# Patient Record
Sex: Female | Born: 1940 | Race: Black or African American | Hispanic: No | Marital: Married | Smoking: Never smoker
Health system: Southern US, Community
[De-identification: ages and names within clinical notes are randomized; demographics above are authoritative.]

## PROBLEM LIST (undated history)

## (undated) DIAGNOSIS — I10 Essential (primary) hypertension: Secondary | ICD-10-CM

## (undated) DIAGNOSIS — N611 Abscess of the breast and nipple: Secondary | ICD-10-CM

## (undated) DIAGNOSIS — D649 Anemia, unspecified: Secondary | ICD-10-CM

## (undated) DIAGNOSIS — K219 Gastro-esophageal reflux disease without esophagitis: Secondary | ICD-10-CM

## (undated) DIAGNOSIS — E7800 Pure hypercholesterolemia, unspecified: Secondary | ICD-10-CM

## (undated) DIAGNOSIS — H409 Unspecified glaucoma: Secondary | ICD-10-CM

## (undated) DIAGNOSIS — C801 Malignant (primary) neoplasm, unspecified: Secondary | ICD-10-CM

## (undated) DIAGNOSIS — E119 Type 2 diabetes mellitus without complications: Secondary | ICD-10-CM

## (undated) HISTORY — DX: Gastro-esophageal reflux disease without esophagitis: K21.9

## (undated) HISTORY — PX: EYE SURGERY: SHX253

## (undated) HISTORY — DX: Abscess of the breast and nipple: N61.1

## (undated) HISTORY — PX: BREAST BIOPSY: SHX20

## (undated) HISTORY — PX: LUMBAR FUSION: SHX111

## (undated) HISTORY — PX: BACK SURGERY: SHX140

## (undated) HISTORY — PX: CATARACT EXTRACTION W/ INTRAOCULAR LENS  IMPLANT, BILATERAL: SHX1307

## (undated) HISTORY — PX: TUBAL LIGATION: SHX77

## (undated) HISTORY — PX: BREAST EXCISIONAL BIOPSY: SUR124

---

## 1995-04-29 HISTORY — PX: LUMBAR LAMINECTOMY: SHX95

## 2006-10-16 DIAGNOSIS — Z961 Presence of intraocular lens: Secondary | ICD-10-CM | POA: Insufficient documentation

## 2007-04-29 HISTORY — PX: RETINAL DETACHMENT SURGERY: SHX105

## 2007-07-16 ENCOUNTER — Other Ambulatory Visit: Payer: Self-pay

## 2007-07-16 ENCOUNTER — Emergency Department: Payer: Self-pay | Admitting: Emergency Medicine

## 2009-02-07 DIAGNOSIS — H40009 Preglaucoma, unspecified, unspecified eye: Secondary | ICD-10-CM | POA: Insufficient documentation

## 2015-11-02 DIAGNOSIS — I10 Essential (primary) hypertension: Secondary | ICD-10-CM | POA: Insufficient documentation

## 2015-11-02 DIAGNOSIS — E7800 Pure hypercholesterolemia, unspecified: Secondary | ICD-10-CM | POA: Insufficient documentation

## 2016-01-24 DIAGNOSIS — H401134 Primary open-angle glaucoma, bilateral, indeterminate stage: Secondary | ICD-10-CM | POA: Insufficient documentation

## 2016-01-24 DIAGNOSIS — Z961 Presence of intraocular lens: Secondary | ICD-10-CM | POA: Insufficient documentation

## 2016-01-24 DIAGNOSIS — H2701 Aphakia, right eye: Secondary | ICD-10-CM | POA: Insufficient documentation

## 2016-02-01 ENCOUNTER — Other Ambulatory Visit: Payer: Self-pay | Admitting: Family Medicine

## 2016-02-01 DIAGNOSIS — Z1231 Encounter for screening mammogram for malignant neoplasm of breast: Secondary | ICD-10-CM

## 2016-02-28 ENCOUNTER — Ambulatory Visit
Admission: RE | Admit: 2016-02-28 | Discharge: 2016-02-28 | Disposition: A | Discharge status: Home / Self Care | Payer: Medicare Other | Source: Ambulatory Visit | Attending: Family Medicine | Admitting: Family Medicine

## 2016-02-28 DIAGNOSIS — Z1231 Encounter for screening mammogram for malignant neoplasm of breast: Secondary | ICD-10-CM | POA: Insufficient documentation

## 2016-03-11 ENCOUNTER — Inpatient Hospital Stay
Admission: RE | Admit: 2016-03-11 | Discharge: 2016-03-11 | Disposition: A | Discharge status: Home / Self Care | Payer: Self-pay | Source: Ambulatory Visit | Attending: *Deleted | Admitting: *Deleted

## 2016-03-11 ENCOUNTER — Other Ambulatory Visit: Payer: Self-pay | Admitting: *Deleted

## 2016-03-11 DIAGNOSIS — Z9289 Personal history of other medical treatment: Secondary | ICD-10-CM

## 2016-11-04 DIAGNOSIS — Z0000 Encounter for general adult medical examination without abnormal findings: Secondary | ICD-10-CM | POA: Insufficient documentation

## 2017-01-16 ENCOUNTER — Other Ambulatory Visit: Payer: Self-pay | Admitting: Family Medicine

## 2017-01-16 DIAGNOSIS — Z1231 Encounter for screening mammogram for malignant neoplasm of breast: Secondary | ICD-10-CM

## 2017-01-19 ENCOUNTER — Encounter: Payer: Self-pay | Admitting: Emergency Medicine

## 2017-01-19 ENCOUNTER — Emergency Department
Admission: EM | Admit: 2017-01-19 | Discharge: 2017-01-19 | Disposition: A | Discharge status: Home / Self Care | Payer: Medicare Other | Attending: Emergency Medicine | Admitting: Emergency Medicine

## 2017-01-19 DIAGNOSIS — I10 Essential (primary) hypertension: Secondary | ICD-10-CM | POA: Insufficient documentation

## 2017-01-19 DIAGNOSIS — E119 Type 2 diabetes mellitus without complications: Secondary | ICD-10-CM | POA: Insufficient documentation

## 2017-01-19 DIAGNOSIS — H409 Unspecified glaucoma: Secondary | ICD-10-CM | POA: Insufficient documentation

## 2017-01-19 DIAGNOSIS — H5711 Ocular pain, right eye: Secondary | ICD-10-CM | POA: Diagnosis present

## 2017-01-19 HISTORY — DX: Type 2 diabetes mellitus without complications: E11.9

## 2017-01-19 HISTORY — DX: Pure hypercholesterolemia, unspecified: E78.00

## 2017-01-19 HISTORY — DX: Essential (primary) hypertension: I10

## 2017-01-19 HISTORY — DX: Unspecified glaucoma: H40.9

## 2017-01-19 MED ORDER — TETRACAINE HCL 0.5 % OP SOLN
2.0000 [drp] | Freq: Once | OPHTHALMIC | Status: DC
  Filled 2017-01-19: qty 4

## 2017-01-19 MED ORDER — FLUORESCEIN SODIUM 0.6 MG OP STRP
2.0000 | ORAL_STRIP | Freq: Once | OPHTHALMIC | Status: DC
  Filled 2017-01-19: qty 2

## 2017-01-19 NOTE — ED Provider Notes (Signed)
[facility] Emergency Department Provider Note  ____________________________________________   First MD Initiated Contact with Patient 01/19/17 1658     (approximate)  I have reviewed the triage vital signs and the nursing notes.   HISTORY  Chief Complaint Eye Pain   HPI Jackie Allison is a 76 y.o. female who comes to the emergency department with roughly 12 hours of discomfort from her right eye. 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 She said that when she put in her glaucoma medication this morning and burned which is atypical for her. Her pain is not different and light or dark. It is constant aching. It is not particularly itchy. She does not describe it as having flashes or floaters she does not describe it as sand.   Past Medical History:  Diagnosis Date  . Diabetes mellitus without complication ([place])   . Glaucoma   . Hypercholesterolemia   . Hypertension     There are no active problems to display for this patient.   Past Surgical History:  Procedure Laterality Date  . BACK SURGERY    . BREAST BIOPSY Right    stereo- neg  . EYE SURGERY      Prior to Admission medications   Not on File    Allergies Shellfish allergy and Sulfa antibiotics  Family History  Problem Relation Age of Onset  . Breast cancer Neg Hx     Social History Social History  Substance Use Topics  . Smoking status: Never Smoker  . Smokeless tobacco: Not on file  . Alcohol use Not on file    Review of Systems Constitutional: No fever/chills ENT: No sore throat. Cardiovascular: Denies chest pain. Respiratory: Denies shortness of breath. Gastrointestinal: No abdominal pain.  No nausea, no vomiting.  No diarrhea.  No constipation. Musculoskeletal: Negative for back pain. Neurological: Negative for headaches   ____________________________________________   PHYSICAL  EXAM:  VITAL SIGNS: ED Triage Vitals  Enc Vitals Group     BP 01/19/17 1503 (!) 134/56     Pulse Rate 01/19/17 1503 71     Resp 01/19/17 1503 (!) 0     Temp 01/19/17 1503 98.2 F (36.8 C)     Temp Source 01/19/17 1503 Oral     SpO2 01/19/17 1503 100 %     Weight 01/19/17 1504 163 lb (73.9 kg)     Height 01/19/17 1504 5\' 8"  (1.727 m)     Head Circumference --      Peak Flow --      Pain Score 01/19/17 1503 3     Pain Loc --      Pain Edu? --      Excl. in [place]? --     Constitutional: alert and oriented 4 joking and laughing well appearing nontoxic no diaphoresis speaks in full clear sentences Head: Atraumatic. right eyes postsurgical left eyes 4 mm round reactive brisk   Visual Acuity  Right Eye Distance: unable to see the first line (Patient stated that she wears a contact lens with her glasses in her right eye.  She does not have it in today. ) Left Eye Distance: 20/40 (with corrective lens on) Bilateral Distance:    Right Eye Near:   Left Eye Near:    Bilateral Near:    IOP 20 OD 15 OS no fluorescein uptake and no ulcers eversion of eyelid did not identify any foreign bodies Nose: No  congestion/rhinnorhea. Mouth/Throat: No trismus Neck: No stridor.   Cardiovascular: regular rate and rhythm Respiratory: Normal respiratory effort.  No retractions.  Neurologic:  Normal speech and language. No gross focal neurologic deficits are appreciated.  Skin:  Skin is warm, dry and intact. No rash noted.    ____________________________________________  LABS (all labs ordered are listed, but only abnormal results are displayed)  Labs Reviewed - No data to display   __________________________________________  EKG   ____________________________________________  RADIOLOGY   ____________________________________________   PROCEDURES  Procedure(s) performed: no  Procedures  Critical Care performed: no  Observation:  no ____________________________________________   INITIAL IMPRESSION / ASSESSMENT AND PLAN / ED COURSE  Pertinent labs & imaging results that were available during my care of the patient were reviewed by me and considered in my medical decision making (see chart for details).  Unclear etiology of the patient's symptoms. Her intraocular pressures are normal and she has no ulcers of fluorescein uptake. She does feel somewhat improved after tetracaine's that she very well may have a small foreign body. She says she is able to follow up with her ophthalmologist tomorrow for recheck and she is medically stable for outpatient management with no emergent etiology of her symptoms. She and her husband verbalize understanding and agreement with the plan.      ____________________________________________   FINAL CLINICAL IMPRESSION(S) / ED DIAGNOSES  Final diagnoses:  Pain of right eye      NEW MEDICATIONS STARTED DURING THIS VISIT:  New Prescriptions   No medications on file     Note:  This document was prepared using Dragon voice recognition software and may include unintentional dictation errors.      Darel Hong, MD 01/19/17 1721

## 2017-01-19 NOTE — Discharge Instructions (Signed)
Today the pressure in your right eye was 20 and the pressure in your left eye was 15.  I did not see any uptake or ulcers on either eye.  Please make sure you follow up with your ophthalmologist tomorrow for reevaluation.  Return to the ED sooner for any concerns.  It was a pleasure to take care of you today, and thank you for coming to our emergency department.  If you have any questions or concerns before leaving please ask the nurse to grab me and I'm more than happy to go through your aftercare instructions again.  If you were prescribed any opioid pain medication today such as Norco, Vicodin, Percocet, morphine, hydrocodone, or oxycodone please make sure you do not drive when you are taking this medication as it can alter your ability to drive safely.  If you have any concerns once you are home that you are not improving or are in fact getting worse before you can make it to your follow-up appointment, please do not hesitate to call 911 and come back for further evaluation.  Darel Hong, MD

## 2017-01-19 NOTE — ED Triage Notes (Signed)
States woke with R eye pain this am. States eye burned when she put her glaucoma med in this am. States does not notice change in vision but has minimal vision from that eye at baseline. States has had 2 surgeries on that eye in past.

## 2017-01-19 NOTE — ED Notes (Signed)
Patient sent here from walk in clinic due to pain in right eye.  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  Patient reports chronic blurred vision.

## 2017-03-02 ENCOUNTER — Ambulatory Visit
Admission: RE | Admit: 2017-03-02 | Discharge: 2017-03-02 | Disposition: A | Discharge status: Home / Self Care | Payer: Medicare Other | Source: Ambulatory Visit | Attending: Family Medicine | Admitting: Family Medicine

## 2017-03-02 DIAGNOSIS — Z1231 Encounter for screening mammogram for malignant neoplasm of breast: Secondary | ICD-10-CM

## 2017-03-24 ENCOUNTER — Encounter (INDEPENDENT_AMBULATORY_CARE_PROVIDER_SITE_OTHER): Payer: Self-pay | Admitting: Orthopaedic Surgery

## 2017-03-24 ENCOUNTER — Ambulatory Visit (INDEPENDENT_AMBULATORY_CARE_PROVIDER_SITE_OTHER): Payer: Medicare Other | Admitting: Orthopaedic Surgery

## 2017-03-24 VITALS — BP 140/80 | HR 75

## 2017-03-24 DIAGNOSIS — M7062 Trochanteric bursitis, left hip: Secondary | ICD-10-CM

## 2017-03-24 NOTE — Progress Notes (Signed)
Office Visit Note   Patient: Jackie Allison           Date of Birth: 1940-09-24           MRN: [id number] Visit Date: 03/24/2017              Requested by: No referring provider defined for this encounter. PCP: System, Pcp Not In   Assessment & Plan: Visit Diagnoses:  1. Trochanteric bursitis, left hip     Plan: The patient will return in 3 weeks she had recent epidural injection at the sacrum done in [place]. She has diabetes and we like to avoid hyperglycemia. Last A1c was 7.2. She'll return in 3 weeks and if she is having persistent problems will consider trochanteric injection on the left.  Follow-Up Instructions: Return in about 3 weeks (around 04/14/2017).   Orders:  No orders of the defined types were placed in this encounter.  No orders of the defined types were placed in this encounter.     Procedures: No procedures performed   Clinical Data: No additional findings.   Subjective: Chief Complaint  Patient presents with  . Lower Back - Follow-up    HPI 76 year old female seen with back pain and left leg pain.  She had previous fusion L4-5 and L5-S1 done 10 years ago at [facility].  She is been seen at the [facility] clinic has had some recent injections most recently sacral S1 injection a few weeks ago.  She does have diabetes also shellfish allergy and needs retreatment if she is going to get a myelogram CT scan.  Fusion showed the L5 screw on the left was removed at the time of her surgery.  Plain radiographs brought today with her on this have not shown L3-4 level above her fusion.  Review of Systems positive for type 2 diabetes, GERD, history of lumbar fusion about  2011 L4-S1.  Previous hyperlipidemia, hypertension, osteopenia.  Eye surgery x3, tubal ligation.   Objective: Vital Signs: BP 140/80   Pulse 75   Physical Exam  Constitutional: She is oriented to person, place, and time. She appears well-developed.  HENT:  Head: Normocephalic.  Right Ear:  External ear normal.  Left Ear: External ear normal.  Eyes: Pupils are equal, round, and reactive to light.  Neck: No tracheal deviation present. No thyromegaly present.  Cardiovascular: Normal rate.  Pulmonary/Chest: Effort normal.  Abdominal: Soft.  Neurological: She is alert and oriented to person, place, and time.  Skin: Skin is warm and dry.  Psychiatric: She has a normal mood and affect. Her behavior is normal.    Ortho Exam patient is slightly unsteady on her feet she has some tenderness of the trochanteric bursa no pain with internal/external rotation of her hips.  Negative Faber test.  Negative straight leg raising 90 degrees.  Anterior tib EHL is strong.  Distal pulses are intact no tenderness the peroneal nerve at the fibular neck.  No sciatic notch tenderness.  Well-healed lumbar incision.  Specialty Comments:  No specialty comments available.  Imaging: X-rays on a disc reviewed that shows pedicle instrumentation L4-S1 with absence of pedicle screw on the left at L5.  She looks like she has some interbody bone without cage placed at L4-5.  She has grade 1/2 anterolisthesis at L5-S1 no evidence of screw loosening.  Difficult to determine if interbody bone was placed in the 5 1 level which had significant collapse.  No significant narrowing at L3-4 level.  She has mild lumbar curvature.  PMFS History: There are no active problems to display for this patient.  Past Medical History:  Diagnosis Date  . Diabetes mellitus without complication ([place])   . Glaucoma   . Hypercholesterolemia   . Hypertension     Family History  Problem Relation Age of Onset  . Breast cancer Neg Hx     Past Surgical History:  Procedure Laterality Date  . BACK SURGERY    . BREAST BIOPSY Right    stereo- neg  . EYE SURGERY     Social History   Occupational History  . Not on file  Tobacco Use  . Smoking status: Never Smoker  Substance and Sexual Activity  . Alcohol use: Not on file  .  Drug use: Not on file  . Sexual activity: Not on file

## 2017-04-14 ENCOUNTER — Ambulatory Visit (INDEPENDENT_AMBULATORY_CARE_PROVIDER_SITE_OTHER): Payer: Medicare Other | Admitting: Orthopaedic Surgery

## 2017-04-28 DIAGNOSIS — C801 Malignant (primary) neoplasm, unspecified: Secondary | ICD-10-CM

## 2017-04-28 HISTORY — DX: Malignant (primary) neoplasm, unspecified: C80.1

## 2017-05-20 ENCOUNTER — Encounter (INDEPENDENT_AMBULATORY_CARE_PROVIDER_SITE_OTHER): Payer: Self-pay | Admitting: Orthopaedic Surgery

## 2017-05-20 ENCOUNTER — Ambulatory Visit (INDEPENDENT_AMBULATORY_CARE_PROVIDER_SITE_OTHER): Payer: Medicare Other | Admitting: Orthopaedic Surgery

## 2017-05-20 VITALS — BP 134/72 | HR 73

## 2017-05-20 DIAGNOSIS — Z981 Arthrodesis status: Secondary | ICD-10-CM

## 2017-05-20 DIAGNOSIS — M7062 Trochanteric bursitis, left hip: Secondary | ICD-10-CM

## 2017-05-20 NOTE — Progress Notes (Signed)
Office Visit Note   Patient: Jackie Allison           Date of Birth: 1940-05-14           MRN: [id number] Visit Date: 05/20/2017              Requested by: No referring provider defined for this encounter. PCP: System, Pcp Not In   Assessment & Plan: Visit Diagnoses:  1. Trochanteric bursitis, left hip   2. Status post lumbar spinal fusion     Plan: He tolerated the injection intertrochanter with improvement in her symptoms.  He has had previous lumbar fusion L4-S1 done at [facility].  Office follow-up if she has persistent symptoms.  Has a history of shellfish allergies needs pretreatment if lumbar myelogram CT scan is considered.  Follow-Up Instructions: Return if symptoms worsen or fail to improve.   Orders:  Orders Placed This Encounter  Procedures  . Large Joint Inj: L greater trochanter   No orders of the defined types were placed in this encounter.     Procedures: Large Joint Inj: L greater trochanter on 05/20/2017 10:59 AM Indications: pain Details: 22 G needle, lateral approach  Arthrogram: No  Medications: 0.5 mL lidocaine 1 %; 2 mL lidocaine (PF) 1 %; 40 mg methylPREDNISolone acetate 40 MG/ML      Clinical Data: No additional findings.   Subjective: Chief Complaint  Patient presents with  . Left Hip - Pain  . Left Knee - Pain    HPI 77 year old female returns with persistent problems with left lateral trochanteric hip pain.  She has had persistent problems with this and requested a trochanteric injection today.  She is had previous epidurals in the past as well as a sacral injection.  She denies associated bowel or bladder symptoms.  Review of Systems 13 point review of systems updated unchanged from last office visit 03/24/2017.  Of note is her diabetes at L4-S1 lumbar fusion 2011 instrumented without a left L5 screw  done in [place]  Objective: Vital Signs: BP 134/72   Pulse 73   Physical Exam  Constitutional: She is  oriented to person, place, and time. She appears well-developed.  HENT:  Head: Normocephalic.  Right Ear: External ear normal.  Left Ear: External ear normal.  Eyes: Pupils are equal, round, and reactive to light.  Neck: No tracheal deviation present. No thyromegaly present.  Cardiovascular: Normal rate.  Pulmonary/Chest: Effort normal.  Abdominal: Soft.  Neurological: She is alert and oriented to person, place, and time.  Skin: Skin is warm and dry.  Psychiatric: She has a normal mood and affect. Her behavior is normal.    Ortho Exam well-healed lumbar incision from previous instrumented lumbar fusion.  Patient has exquisite tenderness over the trochanter on the left.  Pain radiates down almost to her knee. Specialty Comments:  No specialty comments available.  Imaging: Report from plain radiographs July 2018 showed remote lumbar fusion L4-5 and L5-S1 with some residual anterolisthesis L5-S1.   PMFS History: There are no active problems to display for this patient.  Past Medical History:  Diagnosis Date  . Diabetes mellitus without complication ([place])   . Glaucoma   . Hypercholesterolemia   . Hypertension     Family History  Problem Relation Age of Onset  . Breast cancer Neg Hx     Past Surgical History:  Procedure Laterality Date  . BACK SURGERY    . BREAST BIOPSY Right    stereo- neg  .  EYE SURGERY     Social History   Occupational History  . Not on file  Tobacco Use  . Smoking status: Never Smoker  . Smokeless tobacco: Never Used  Substance and Sexual Activity  . Alcohol use: Not on file  . Drug use: Not on file  . Sexual activity: Not on file

## 2017-05-22 ENCOUNTER — Encounter (INDEPENDENT_AMBULATORY_CARE_PROVIDER_SITE_OTHER): Payer: Self-pay | Admitting: Orthopaedic Surgery

## 2017-05-22 MED ORDER — LIDOCAINE HCL 1 % IJ SOLN
0.5000 mL | INTRAMUSCULAR | Status: AC | PRN
  Administered 2017-05-20: .5 mL

## 2017-05-22 MED ORDER — LIDOCAINE HCL (PF) 1 % IJ SOLN
2.0000 mL | INTRAMUSCULAR | Status: AC | PRN
  Administered 2017-05-20: 2 mL

## 2017-05-22 MED ORDER — METHYLPREDNISOLONE ACETATE 40 MG/ML IJ SUSP
40.0000 mg | INTRAMUSCULAR | Status: AC | PRN
  Administered 2017-05-20: 40 mg via INTRA_ARTICULAR

## 2017-09-22 ENCOUNTER — Encounter: Payer: Self-pay | Admitting: *Deleted

## 2017-09-22 ENCOUNTER — Encounter: Payer: Medicare Other | Attending: Family Medicine | Admitting: *Deleted

## 2017-09-22 VITALS — BP 160/82 | Ht 68.0 in | Wt 169.5 lb

## 2017-09-22 DIAGNOSIS — Z713 Dietary counseling and surveillance: Secondary | ICD-10-CM | POA: Insufficient documentation

## 2017-09-22 DIAGNOSIS — E119 Type 2 diabetes mellitus without complications: Secondary | ICD-10-CM

## 2017-09-22 NOTE — Patient Instructions (Signed)
Check blood sugars 1 x day before breakfast or 2 hrs after one meal every day Bring blood sugar records to the next class  Exercise: Walk as tolerated  Eat 3 meals day,   1  snack a day Space meals 4-6 hours apart  Return for classes on:

## 2017-09-22 NOTE — Progress Notes (Signed)
Diabetes Self-Management Education  Visit Type: First/Initial  Appt. Start Time: 0905 Appt. End Time: 1000  09/22/2017  [name], identified by name and date of birth, is a 77 y.o. female with a diagnosis of Diabetes: Type 2.   ASSESSMENT  Blood pressure (!) 160/82, height 5\' 8"  (1.727 m), weight 169 lb 8 oz (76.9 kg). Body mass index is 25.77 kg/m.  Diabetes Self-Management Education - 09/22/17 1026      Visit Information   Visit Type  First/Initial      Initial Visit   Diabetes Type  Type 2    Are you currently following a meal plan?  No    Are you taking your medications as prescribed?  Yes    Date Diagnosed  3 years ago      Health Coping   How would you rate your overall health?  Good      Psychosocial Assessment   Patient Belief/Attitude about Diabetes  Motivated to manage diabetes "angry at first"    Self-care barriers  None    Self-management support  Doctor's office;Family    Patient Concerns  Nutrition/Meal planning;Glycemic Control;Weight Control;Healthy Lifestyle;Other (comment) Become more fit    Special Needs  None    Preferred Learning Style  Hands on    Learning Readiness  Ready    How often do you need to have someone help you when you read instructions, pamphlets, or other written materials from your doctor or pharmacy?  1 - Never    What is the last grade level you completed in school?  BS      Pre-Education Assessment   Patient understands the diabetes disease and treatment process.  Needs Instruction    Patient understands incorporating nutritional management into lifestyle.  Needs Instruction    Patient undertands incorporating physical activity into lifestyle.  Needs Instruction    Patient understands using medications safely.  Needs Instruction    Patient understands monitoring blood glucose, interpreting and using results  Needs Review    Patient understands prevention, detection, and treatment of acute complications.  Needs Instruction     Patient understands prevention, detection, and treatment of chronic complications.  Needs Instruction    Patient understands how to develop strategies to address psychosocial issues.  Needs Instruction    Patient understands how to develop strategies to promote health/change behavior.  Needs Instruction      Complications   Last HgB A1C per patient/outside source  7.4 % 08/31/17    How often do you check your blood sugar?  1-2 times/day    Fasting Blood glucose range (mg/dL)  70-129;130-179 Pt reports FBG's 128-155 mg/dL.     Have you had a dilated eye exam in the past 12 months?  Yes    Have you had a dental exam in the past 12 months?  Yes    Are you checking your feet?  Yes    How many days per week are you checking your feet?  7      Dietary Intake   Breakfast  alternates between cereal, milk and fruit with egg, toast, bacon or sausage    Lunch  left overs    Dinner  chicken, pork, fish with sweet potatoes, corn, beans, cabbage, greens, lettuce, tomatoes, broccolli, cauliflower    Snack (evening)  popcorn    Beverage(s)  water, unsweetened tea, coffee      Exercise   Exercise Type  ADL's      Patient Education   Previous  Diabetes Education  Yes (please comment) doctor from [employer]    Disease state   Definition of diabetes, type 1 and 2, and the diagnosis of diabetes;Factors that contribute to the development of diabetes    Nutrition management   Role of diet in the treatment of diabetes and the relationship between the three main macronutrients and blood glucose level;Food label reading, portion sizes and measuring food.;Reviewed blood glucose goals for pre and post meals and how to evaluate the patients' food intake on their blood glucose level.    Physical activity and exercise   Role of exercise on diabetes management, blood pressure control and cardiac health.    Medications  Reviewed patients medication for diabetes, action, purpose, timing of dose and side effects.     Monitoring  Purpose and frequency of SMBG.;Taught/discussed recording of test results and interpretation of SMBG.;Identified appropriate SMBG and/or A1C goals.    Chronic complications  Relationship between chronic complications and blood glucose control    Psychosocial adjustment  Role of stress on diabetes;Identified and addressed patients feelings and concerns about diabetes      Individualized Goals (developed by patient)   Reducing Risk  Improve blood sugars Lose weight Lead a healthier lifestyle Become more fit     Outcomes   Expected Outcomes  Demonstrated interest in learning. Expect positive outcomes    Future DMSE  2 wks       Individualized Plan for Diabetes Self-Management Training:   Learning Objective:  Patient will have a greater understanding of diabetes self-management. Patient education plan is to attend individual and/or group sessions per assessed needs and concerns.   Plan:   Patient Instructions  Check blood sugars 1 x day before breakfast or 2 hrs after one meal every day Bring blood sugar records to the next class Exercise: Walk as tolerated Eat 3 meals day,   1  snack a day Space meals 4-6 hours apart  Expected Outcomes:  Demonstrated interest in learning. Expect positive outcomes  Education material provided:  General Meal Planning Guidelines Simple Meal Plan  If problems or questions, patient to contact team via:  Johny Drilling, [employer], [place], CDE [phone]  Future DSME appointment: 2 wks  October 08, 2017 for Diabetes Class 1

## 2017-10-08 ENCOUNTER — Encounter: Payer: Self-pay | Admitting: Dietician

## 2017-10-08 ENCOUNTER — Encounter: Payer: Medicare Other | Attending: Family Medicine | Admitting: Dietician

## 2017-10-08 VITALS — Wt 171.0 lb

## 2017-10-08 DIAGNOSIS — E119 Type 2 diabetes mellitus without complications: Secondary | ICD-10-CM | POA: Diagnosis not present

## 2017-10-08 DIAGNOSIS — Z713 Dietary counseling and surveillance: Secondary | ICD-10-CM | POA: Insufficient documentation

## 2017-10-08 NOTE — Progress Notes (Signed)

## 2017-10-11 ENCOUNTER — Other Ambulatory Visit: Payer: Self-pay

## 2017-10-11 ENCOUNTER — Emergency Department
Admission: EM | Admit: 2017-10-11 | Discharge: 2017-10-11 | Disposition: A | Discharge status: Home / Self Care | Payer: Medicare Other | Attending: Emergency Medicine | Admitting: Emergency Medicine

## 2017-10-11 DIAGNOSIS — R5383 Other fatigue: Secondary | ICD-10-CM | POA: Diagnosis not present

## 2017-10-11 DIAGNOSIS — E119 Type 2 diabetes mellitus without complications: Secondary | ICD-10-CM | POA: Diagnosis not present

## 2017-10-11 DIAGNOSIS — Z79899 Other long term (current) drug therapy: Secondary | ICD-10-CM | POA: Diagnosis not present

## 2017-10-11 DIAGNOSIS — Z7982 Long term (current) use of aspirin: Secondary | ICD-10-CM | POA: Insufficient documentation

## 2017-10-11 DIAGNOSIS — R42 Dizziness and giddiness: Secondary | ICD-10-CM | POA: Diagnosis not present

## 2017-10-11 DIAGNOSIS — Z7984 Long term (current) use of oral hypoglycemic drugs: Secondary | ICD-10-CM | POA: Insufficient documentation

## 2017-10-11 DIAGNOSIS — I10 Essential (primary) hypertension: Secondary | ICD-10-CM

## 2017-10-11 LAB — URINALYSIS, COMPLETE (UACMP) WITH MICROSCOPIC
Bacteria, UA: NONE SEEN
Bilirubin Urine: NEGATIVE
Glucose, UA: NEGATIVE mg/dL
Hgb urine dipstick: NEGATIVE
Ketones, ur: NEGATIVE mg/dL
Leukocytes, UA: NEGATIVE
Nitrite: NEGATIVE
Protein, ur: NEGATIVE mg/dL
Specific Gravity, Urine: 1.01 (ref 1.005–1.030)
pH: 7 (ref 5.0–8.0)

## 2017-10-11 LAB — CBC
HEMATOCRIT: 35.4 % (ref 35.0–47.0)
Hemoglobin: 11.8 g/dL — ABNORMAL LOW (ref 12.0–16.0)
MCH: 25.4 pg — ABNORMAL LOW (ref 26.0–34.0)
MCHC: 33.3 g/dL (ref 32.0–36.0)
MCV: 76.3 fL — AB (ref 80.0–100.0)
Platelets: 260 10*3/uL (ref 150–440)
RBC: 4.64 MIL/uL (ref 3.80–5.20)
RDW: 14.7 % — ABNORMAL HIGH (ref 11.5–14.5)
WBC: 6.4 10*3/uL (ref 3.6–11.0)

## 2017-10-11 LAB — BASIC METABOLIC PANEL WITH GFR
Anion gap: 11 (ref 5–15)
BUN: 21 mg/dL — ABNORMAL HIGH (ref 6–20)
CO2: 22 mmol/L (ref 22–32)
Calcium: 9.4 mg/dL (ref 8.9–10.3)
Chloride: 100 mmol/L — ABNORMAL LOW (ref 101–111)
Creatinine, Ser: 0.85 mg/dL (ref 0.44–1.00)
GFR calc Af Amer: >60 mL/min
GFR calc non Af Amer: >60 mL/min
Glucose, Bld: 149 mg/dL — ABNORMAL HIGH (ref 65–99)
Potassium: 3.9 mmol/L (ref 3.5–5.1)
Sodium: 133 mmol/L — ABNORMAL LOW (ref 135–145)

## 2017-10-11 LAB — TROPONIN I: Troponin I: <0.03 ng/mL (ref <0.03)

## 2017-10-11 NOTE — ED Provider Notes (Signed)
-----------------------------------------   9:21 AM on 10/11/2017 -----------------------------------------  Patient resting comfortably, asymptomatic.  Her second troponin is normal.  Patient reports she is been working with Dr. Netty Starring on her medications recently, she is comfortable to plan to call his office tomorrow for close follow-up and further recommendations on adjustment of her blood pressure medicines.  No signs or symptoms of hypertensive emergency or endorgan damage noted.  Patient currently asymptomatic.  Return precautions and treatment recommendations and follow-up discussed with the patient who is agreeable with the plan.    Delman Kitten, MD 10/11/17 [phone]

## 2017-10-11 NOTE — ED Triage Notes (Signed)
Reports blood pressure has been up.  Reports being dizzy and sleepy.

## 2017-10-11 NOTE — Discharge Instructions (Signed)
As we discussed, though you do have high blood pressure (hypertension), fortunately it is not immediately dangerous at this time and does not need emergency intervention or admission to the hospital.  If we add to or change your regular medications, we may cause more harm than good - it is more appropriate for your primary care doctor to evaluate you in clinic and decide if any medication changes are needed.  Please follow up in clinic as recommended in these papers. ° °Return to the Emergency Department (ED) if you experience any chest pain/pressure/tightness, difficulty breathing, or sudden sweating, or other symptoms that concern you. ° °

## 2017-10-11 NOTE — ED Provider Notes (Signed)
[facility] Emergency Department Provider Note   ____________________________________________   First MD Initiated Contact with Patient 10/11/17 0600     (approximate)  I have reviewed the triage vital signs and the nursing notes.   HISTORY  Chief Complaint Hypertension    HPI Jackie Allison is a 77 y.o. female who comes into the hospital today concerned about her blood pressure.  She reports that its been elevated for over a week.  The last time she took the pressure it was over 200.  She has been on Lotrel which is a mixture of amlodipine and another medication.  She reports that she started having some swelling in her legs which is a side effect so she was taken off of the amlodipine and her doctor attempted to double the other medication.  She states that her blood pressure was still high so they added carvedilol.  She is only been on carvedilol for a few days but she reports that her blood pressure has remained high.  She states that she did not call her doctor regarding her high blood pressures but she is been feeling tired and having some lightheadedness.  The patient denies any chest pain, headache, shortness of breath or weakness.  She was concerned with her elevated blood pressure so she decided to come into the hospital today for evaluation.   Past Medical History:  Diagnosis Date  . Diabetes mellitus without complication ([place])   . Glaucoma   . Hypercholesterolemia   . Hypertension     There are no active problems to display for this patient.   Past Surgical History:  Procedure Laterality Date  . BACK SURGERY    . BREAST BIOPSY Right    stereo- neg  . EYE SURGERY      Prior to Admission medications   Medication Sig Start Date End Date Taking? Authorizing Provider  acetaminophen (TYLENOL) 500 MG tablet Take 500 mg by mouth every 6 (six) hours as needed.  11/04/16   [provider]  aspirin 81 MG chewable tablet Chew 81 mg by mouth  daily.     [provider]  atorvastatin (LIPITOR) 10 MG tablet Take 10 mg by mouth daily.  10/16/16   [provider]  benazepril (LOTENSIN) 20 MG tablet Take 40 mg by mouth daily.  09/11/17 09/11/18  [provider]  brimonidine (ALPHAGAN) 0.2 % ophthalmic solution Place 1 drop into the right eye 2 (two) times daily.     [provider]  chlorhexidine (PERIDEX) 0.12 % solution RINSE WITH 1/2OZ FOR 30SEC AFTER TOROUGH BRUSHING 3 TIMES DAILY 09/07/17   [provider]  Coenzyme Q10 (COQ10 PO) Take 1 tablet by mouth daily.    [provider]  dorzolamide-timolol (COSOPT) 22.3-6.8 MG/ML ophthalmic solution Place 1 drop into both eyes 2 (two) times daily.  11/10/16 11/10/17  [provider]  fluocinonide (LIDEX) 0.05 % external solution APPLY TO AFFECTED AREA TOPICALLY EVERY DAY 08/10/17   [provider]  glucose blood (PRECISION QID TEST) test strip Use as directed Patient needs Freestyle Lite test strips. Check CBG's fasting once daily. Dx: E11.9 03/16/17 03/16/18  [provider]  Lancets 28G MISC Use 1 each as directed Patient needs Freestyle Lite lancets. Check CBG's fasting once daily. Dx: E11.9 03/16/17 03/16/18  [provider]  latanoprost (XALATAN) 0.005 % ophthalmic solution Place 1 drop into both eyes at bedtime.  01/30/17   [provider]  metFORMIN (GLUCOPHAGE) 500 MG tablet Take  500 mg by mouth 2 (two) times daily with a meal.  10/23/16   [provider]  Multiple Vitamin (MULTI-VITAMINS) TABS Take 1 tablet by mouth daily.     [provider]  omeprazole (PRILOSEC) 40 MG capsule Take 40 mg by mouth daily.  12/02/16   [provider]  Specialty Vitamins Products (MAGNESIUM, AMINO ACID CHELATE,) 133 MG tablet Take 1 tablet by mouth daily.    [provider]  triamcinolone cream (KENALOG) 0.1 % APPLY TO AFFECTED AREA TOPICALLY 2(TWO) TIMES A DAY 08/10/17   [provider]    Allergies Shellfish allergy and Sulfa antibiotics  Family History  Problem Relation Age of Onset  . Diabetes Sister   . Diabetes Brother   . Diabetes Sister   . Breast cancer Neg Hx     Social History Social History   Tobacco Use  . Smoking status: Never Smoker  . Smokeless tobacco: Never Used  Substance Use Topics  . Alcohol use: Never    Frequency: Never  . Drug use: Not on file    Review of Systems  Constitutional: Fatigue Eyes: No visual changes. ENT: No sore throat. Cardiovascular: Denies chest pain. Respiratory: Denies shortness of breath. Gastrointestinal: No abdominal pain.  No nausea, no vomiting.  No diarrhea.  No constipation. Genitourinary: Negative for dysuria. Musculoskeletal: Negative for back pain. Skin: Negative for rash. Neurological: Lightheadedness   ____________________________________________   PHYSICAL EXAM:  VITAL SIGNS: ED Triage Vitals  Enc Vitals Group     BP 10/11/17 0543 (!) 189/92     Pulse Rate 10/11/17 0543 76     Resp 10/11/17 0543 20     Temp 10/11/17 0543 97.7 F (36.5 C)     Temp Source 10/11/17 0543 Oral     SpO2 10/11/17 0543 100 %     Weight --      Height --      Head Circumference --      Peak Flow --      Pain Score 10/11/17 0542 0     Pain Loc --      Pain Edu? --      Excl. in [place]? --     Constitutional: Alert and oriented. Well appearing and in mild distress. Eyes: Conjunctivae are normal. PERRL. EOMI. Head: Atraumatic. Nose: No congestion/rhinnorhea. Mouth/Throat: Mucous membranes are moist.  Oropharynx non-erythematous. Cardiovascular: Normal rate, regular rhythm. Grossly normal heart sounds.  Good peripheral circulation. Respiratory: Normal respiratory effort.  No retractions. Lungs CTAB. Gastrointestinal: Soft and nontender. No distention.  Positive bowel sounds Musculoskeletal: No lower extremity tenderness nor edema.   Neurologic:  Normal speech and language.  Cranial nerves  II through XII are grossly intact with no focal motor neuro deficit Skin:  Skin is warm, dry and intact.  Psychiatric: Mood and affect are normal.   ____________________________________________   LABS (all labs ordered are listed, but only abnormal results are displayed)  Labs Reviewed  CBC - Abnormal; Notable for the following components:      Result Value   Hemoglobin 11.8 (*)    MCV 76.3 (*)    MCH 25.4 (*)    RDW 14.7 (*)    All other components within normal limits  BASIC METABOLIC PANEL - Abnormal; Notable for the following components:   Sodium 133 (*)    Chloride 100 (*)    Glucose, Bld 149 (*)    BUN 21 (*)    All other components within normal limits  URINALYSIS,  COMPLETE (UACMP) WITH MICROSCOPIC - Abnormal; Notable for the following components:   Color, Urine STRAW (*)    APPearance CLEAR (*)    All other components within normal limits  TROPONIN I  TROPONIN I   ____________________________________________  EKG  ED ECG REPORT I, Loney Hering, the attending physician, personally viewed and interpreted this ECG.   Date: 10/11/2017  EKG Time: 0602  Rate: 64  Rhythm: normal sinus rhythm  Axis: normal  Intervals:none  ST&T Change: none  ____________________________________________  RADIOLOGY  ED MD interpretation:  none  Official radiology report(s): No results found.  ____________________________________________   PROCEDURES  Procedure(s) performed: None  Procedures  Critical Care performed: No  ____________________________________________   INITIAL IMPRESSION / ASSESSMENT AND PLAN / ED COURSE  As part of my medical decision making, I reviewed the following data within the electronic MEDICAL RECORD NUMBER Notes from prior ED visits and [place] Controlled Substance Database   This is a 77 year old female who comes into the hospital today concerned about her elevated blood pressure.  We did check some blood work on the patient to include a  CBC, BMP and a troponin.  The patient also had a urinalysis which was negative.  The concern is that the patient may have some possible hypertensive urgency or hypertensive emergency.  The patient's blood pressure did improve to the 160s over 70s while she was in the emergency department without any intervention.  We will repeat her troponin on the patient and she will continue to be monitored for any worsen or changing symptoms.  The patient's care will be signed out to Dr. Jacqualine Code who will follow up the results and disposition the patient.      ____________________________________________   FINAL CLINICAL IMPRESSION(S) / ED DIAGNOSES  Final diagnoses:  Hypertension, unspecified type  Fatigue, unspecified type     ED Discharge Orders    None       Note:  This document was prepared using Dragon voice recognition software and may include unintentional dictation errors.    Loney Hering, MD 10/11/17 [phone]

## 2017-10-15 ENCOUNTER — Encounter: Payer: Medicare Other | Admitting: *Deleted

## 2017-10-15 ENCOUNTER — Encounter: Payer: Self-pay | Admitting: *Deleted

## 2017-10-15 VITALS — Wt 168.7 lb

## 2017-10-15 DIAGNOSIS — E119 Type 2 diabetes mellitus without complications: Secondary | ICD-10-CM

## 2017-10-15 DIAGNOSIS — Z713 Dietary counseling and surveillance: Secondary | ICD-10-CM | POA: Diagnosis not present

## 2017-10-15 NOTE — Progress Notes (Signed)

## 2017-10-22 ENCOUNTER — Encounter: Payer: Self-pay | Admitting: Dietician

## 2017-10-22 ENCOUNTER — Encounter: Payer: Medicare Other | Admitting: Dietician

## 2017-10-22 VITALS — BP 150/80 | Wt 168.0 lb

## 2017-10-22 DIAGNOSIS — E119 Type 2 diabetes mellitus without complications: Secondary | ICD-10-CM

## 2017-10-22 DIAGNOSIS — Z713 Dietary counseling and surveillance: Secondary | ICD-10-CM | POA: Diagnosis not present

## 2017-10-22 NOTE — Progress Notes (Signed)
Appt. Start Time: 900 Appt. End Time: 1200  Class 3 Diabetes Overview - identify functions of pancreas and insulin; define insulin deficiency vs insulin resistance  Medications - state name, dose, timing of currently prescribed medications; describe types of medications available for diabetes  Psychosocial - identify DM as a source of stress; state the effects of stress on BG control; verbalize appropriate stress management techniques; identify personal stress issues   Nutritional Management - use food labels to identify serving size, content of carbohydrate, fiber, protein, fat, saturated fat and sodium; recognize food sources of fat, saturated fat, trans fat, and sodium, and verbalize goals for intake; describe healthful, appropriate food choices when dining out   Exercise - state a plan for personal exercise; verbalize contraindications for exercise  Self-Monitoring - state importance of SMBG; use SMBG results to effectively manage diabetes; identify importance of regular HbA1C testing and goals for results  Acute Complications - recognize hyperglycemia and hypoglycemia with causes and effects; identify blood glucose results as high, low or in control; list steps in treating and preventing high and low blood glucose  Chronic Complications - state importance of daily self-foot exams; explain appropriate eye and dental care  Lifestyle Changes/Goals & Health/Community Resources - set goals for proper diabetes care; state need for and frequency of healthcare follow-up; describe appropriate community resources for good health (ADA, web sites, apps)   Teaching Materials Used: Class 3 Slide Packet Diabetes Stress Test Stress Management Tools Stress Poem Goal Setting Worksheet Website/App List    

## 2017-10-28 ENCOUNTER — Encounter: Payer: Self-pay | Admitting: *Deleted

## 2018-01-11 DIAGNOSIS — E871 Hypo-osmolality and hyponatremia: Secondary | ICD-10-CM | POA: Insufficient documentation

## 2018-01-20 ENCOUNTER — Other Ambulatory Visit: Payer: Self-pay | Admitting: Family Medicine

## 2018-01-20 DIAGNOSIS — Z1231 Encounter for screening mammogram for malignant neoplasm of breast: Secondary | ICD-10-CM

## 2018-02-10 ENCOUNTER — Other Ambulatory Visit (INDEPENDENT_AMBULATORY_CARE_PROVIDER_SITE_OTHER): Payer: Self-pay | Admitting: Family Medicine

## 2018-02-10 DIAGNOSIS — I10 Essential (primary) hypertension: Secondary | ICD-10-CM

## 2018-02-11 ENCOUNTER — Encounter (INDEPENDENT_AMBULATORY_CARE_PROVIDER_SITE_OTHER): Payer: Self-pay | Admitting: Vascular Surgery

## 2018-02-11 ENCOUNTER — Ambulatory Visit (INDEPENDENT_AMBULATORY_CARE_PROVIDER_SITE_OTHER): Payer: Medicare Other | Admitting: Vascular Surgery

## 2018-02-11 ENCOUNTER — Other Ambulatory Visit (INDEPENDENT_AMBULATORY_CARE_PROVIDER_SITE_OTHER): Payer: Self-pay | Admitting: Vascular Surgery

## 2018-02-11 ENCOUNTER — Ambulatory Visit (INDEPENDENT_AMBULATORY_CARE_PROVIDER_SITE_OTHER): Payer: Medicare Other

## 2018-02-11 DIAGNOSIS — E785 Hyperlipidemia, unspecified: Secondary | ICD-10-CM | POA: Insufficient documentation

## 2018-02-11 DIAGNOSIS — I10 Essential (primary) hypertension: Secondary | ICD-10-CM

## 2018-02-11 DIAGNOSIS — I890 Lymphedema, not elsewhere classified: Secondary | ICD-10-CM | POA: Diagnosis not present

## 2018-02-11 DIAGNOSIS — E119 Type 2 diabetes mellitus without complications: Secondary | ICD-10-CM | POA: Insufficient documentation

## 2018-02-11 DIAGNOSIS — E782 Mixed hyperlipidemia: Secondary | ICD-10-CM

## 2018-02-11 NOTE — Progress Notes (Signed)
MRN : [id number]  Jackie Allison is a 77 y.o. ([birth year]) female who presents with chief complaint of  Chief Complaint  Patient presents with  . New Patient (Initial Visit)    Renal artery stenosis  .  History of Present Illness:   The patient is seen for evaluation of malignant hypertension which has been very difficult to control. The patient has a long history of hypertension which recently has become increasingly difficult to control utilizing medical therapy. The patient is consistently documented systolic blood pressures near 151 with diastolic pressures over 90.   The patient does have family history of hypertension.   There is no prior documented abdominal bruit. The patient occasionally has flushing symptoms but denies palpitations. No episodes of syncope.There is no history of headache. There is no history of flash pulmonary edema.  The patient denies a history of renal disease.  The patient denies amaurosis fugax or recent TIA symptoms. There are no recent neurological changes noted. The patient denies claudication symptoms or rest pain symptoms. The patient denies history of DVT, PE or superficial thrombophlebitis. The patient denies recent episodes of angina or shortness of breath.   Duplex ultrasound of the renal arteries demonstrates widely patent renal arteries bilaterally no evidence of renal artery stenosis   Current Meds  Medication Sig  . acetaminophen (TYLENOL) 500 MG tablet Take 500 mg by mouth every 6 (six) hours as needed.   Marland Kitchen aspirin 81 MG chewable tablet Chew 81 mg by mouth daily.   Marland Kitchen atorvastatin (LIPITOR) 10 MG tablet Take 10 mg by mouth daily.   . benazepril (LOTENSIN) 40 MG tablet Take 40 mg by mouth daily.   . brimonidine (ALPHAGAN) 0.2 % ophthalmic solution Place 1 drop into the right eye 2 (two) times daily.   . carvedilol (COREG) 3.125 MG tablet Take 3.125 mg by mouth 2 (two) times daily with a meal.  . chlorhexidine (PERIDEX) 0.12 % solution  Rinse 30ML by mouth 3 times daily after a thorough brushing  . Coenzyme Q10 (COQ10 PO) Take 1 tablet by mouth daily.  . fluocinonide (LIDEX) 0.05 % external solution APPLY TO AFFECTED AREA TOPICALLY EVERY DAY  . glucose blood (PRECISION QID TEST) test strip Use as directed Patient needs Freestyle Lite test strips. Check CBG's fasting once daily. Dx: E11.9  . hydrALAZINE (APRESOLINE) 25 MG tablet TAKE 2 TABLETS (50 MG TOTAL) BY MOUTH 3 (THREE) TIMES DAILY  . ibuprofen (ADVIL,MOTRIN) 600 MG tablet TAKE 1 TABLET BY MOUTH FOUR TIMES A DAY ALTERNATING WITH TYLENOL AS DIRECTED  . Lancets 28G MISC Use 1 each as directed Patient needs Freestyle Lite lancets. Check CBG's fasting once daily. Dx: E11.9  . latanoprost (XALATAN) 0.005 % ophthalmic solution Place 1 drop into both eyes at bedtime.   . metFORMIN (GLUCOPHAGE) 500 MG tablet Take 500 mg by mouth 2 (two) times daily with a meal.   . Multiple Vitamin (MULTI-VITAMINS) TABS Take 1 tablet by mouth daily.   Marland Kitchen omeprazole (PRILOSEC) 40 MG capsule Take 40 mg by mouth daily.   Marland Kitchen Specialty Vitamins Products (MAGNESIUM, AMINO ACID CHELATE,) 133 MG tablet Take 1 tablet by mouth daily.  Marland Kitchen tobramycin-dexamethasone (TOBRADEX) ophthalmic solution INSTILL 1 DROP INTO RIGHT EYE 4 TIMES A DAY  . triamcinolone cream (KENALOG) 0.1 % APPLY TO AFFECTED AREA TOPICALLY 2(TWO) TIMES A DAY    Past Medical History:  Diagnosis Date  . Diabetes mellitus without complication ([place])   . Glaucoma   . Hypercholesterolemia   .  Hypertension     Past Surgical History:  Procedure Laterality Date  . BACK SURGERY    . BREAST BIOPSY Right    stereo- neg  . EYE SURGERY      Social History Social History   Tobacco Use  . Smoking status: Never Smoker  . Smokeless tobacco: Never Used  Substance Use Topics  . Alcohol use: Never    Frequency: Never  . Drug use: Not on file    Family History Family History  Problem Relation Age of Onset  . Diabetes Sister   . Diabetes  Brother   . Diabetes Sister   . Breast cancer Neg Hx   No family history of bleeding/clotting disorders, porphyria or autoimmune disease   Allergies  Allergen Reactions  . Shellfish Allergy Swelling  . Sulfa Antibiotics Itching     REVIEW OF SYSTEMS (Negative unless checked)  Constitutional: [] Weight loss  [] Fever  [] Chills Cardiac: [] Chest pain   [] Chest pressure   [] Palpitations   [] Shortness of breath when laying flat   [] Shortness of breath with exertion. Vascular:  [] Pain in legs with walking   [] Pain in legs at rest  [] History of DVT   [] Phlebitis   [x] Swelling in legs   [] Varicose veins   [] Non-healing ulcers Pulmonary:   [] Uses home oxygen   [] Productive cough   [] Hemoptysis   [] Wheeze  [] COPD   [] Asthma Neurologic:  [] Dizziness   [] Seizures   [] History of stroke   [] History of TIA  [] Aphasia   [] Vissual changes   [] Weakness or numbness in arm   [] Weakness or numbness in leg Musculoskeletal:   [] Joint swelling   [x] Joint pain   [] Low back pain Hematologic:  [] Easy bruising  [] Easy bleeding   [] Hypercoagulable state   [] Anemic Gastrointestinal:  [] Diarrhea   [] Vomiting  [] Gastroesophageal reflux/heartburn   [] Difficulty swallowing. Genitourinary:  [] Chronic kidney disease   [] Difficult urination  [] Frequent urination   [] Blood in urine Skin:  [] Rashes   [] Ulcers  Psychological:  [] History of anxiety   []  History of major depression.  Physical Examination  There were no vitals filed for this visit. There is no height or weight on file to calculate BMI. Gen: WD/WN, NAD Head: [place]/AT, No temporalis wasting.  Ear/Nose/Throat: Hearing grossly intact, nares w/o erythema or drainage, poor dentition Eyes: PER, EOMI, sclera nonicteric.  Neck: Supple, no masses.  No bruit or JVD.  Pulmonary:  Good air movement, clear to auscultation bilaterally, no use of accessory muscles.  Cardiac: RRR, normal S1, S2, no Murmurs. Vascular: scattered small varicosities present bilaterally.  Mild  venous stasis changes to the legs bilaterally.  2-3+ soft pitting edema Vessel Right Left  Radial Palpable Palpable  PT Palpable Palpable  DP Palpable Palpable  Gastrointestinal: soft, non-distended. No guarding/no peritoneal signs.  Musculoskeletal: M/S 5/5 throughout.  No deformity or atrophy.  Neurologic: CN 2-12 intact. Pain and light touch intact in extremities.  Symmetrical.  Speech is fluent. Motor exam as listed above. Psychiatric: Judgment intact, Mood & affect appropriate for pt's clinical situation. Dermatologic: mild venous rashes no ulcers noted.  No changes consistent with cellulitis. Lymph : No Cervical lymphadenopathy, no lichenification or skin changes of chronic lymphedema.  CBC Lab Results  Component Value Date   WBC 6.4 10/11/2017   HGB 11.8 (L) 10/11/2017   HCT 35.4 10/11/2017   MCV 76.3 (L) 10/11/2017   PLT 260 10/11/2017    BMET    Component Value Date/Time   NA 133 (L) 10/11/2017 4742  K 3.9 10/11/2017 0611   CL 100 (L) 10/11/2017 0611   CO2 22 10/11/2017 0611   GLUCOSE 149 (H) 10/11/2017 0611   BUN 21 (H) 10/11/2017 0611   CREATININE 0.85 10/11/2017 0611   CALCIUM 9.4 10/11/2017 0611   GFRNONAA >60 10/11/2017 0611   GFRAA >60 10/11/2017 0611   CrCl cannot be calculated (Patient's most recent lab result is older than the maximum 21 days allowed.).  COAG No results found for: INR, PROTIME  Radiology No results found.    Assessment/Plan 1. Malignant hypertension Continue antihypertensive medications as already ordered, these medications have been reviewed and there are no changes at this time.  No indication for intervention at this time  2. Lymphedema I have had a long discussion with the patient regarding swelling and why it  causes symptoms.  Patient will begin wearing graduated compression stockings class 1 (20-30 mmHg) on a daily basis a prescription was given. The patient will  beginning wearing the stockings first thing in the morning  and removing them in the evening. The patient is instructed specifically not to sleep in the stockings.   In addition, behavioral modification will be initiated.  This will include frequent elevation, use of over the counter pain medications and exercise such as walking.  I have reviewed systemic causes for chronic edema such as liver, kidney and cardiac etiologies.  The patient denies problems with these organ systems.    Consideration for a lymph pump will also be made based upon the effectiveness of conservative therapy.  This would help to improve the edema control and prevent sequela such as ulcers and infections   The patient will follow-up with me in three months.    3. Mixed hyperlipidemia Continue statin as ordered and reviewed, no changes at this time   4. Type 2 diabetes mellitus without complication, without long-term current use of insulin (HCC) Continue hypoglycemic medications as already ordered, these medications have been reviewed and there are no changes at this time.  Hgb A1C to be monitored as already arranged by primary service    Hortencia Pilar, MD  02/11/2018 11:52 AM

## 2018-02-14 ENCOUNTER — Encounter (INDEPENDENT_AMBULATORY_CARE_PROVIDER_SITE_OTHER): Payer: Self-pay | Admitting: Vascular Surgery

## 2018-02-14 DIAGNOSIS — I890 Lymphedema, not elsewhere classified: Secondary | ICD-10-CM | POA: Insufficient documentation

## 2018-02-26 HISTORY — PX: BREAST BIOPSY: SHX20

## 2018-03-03 ENCOUNTER — Ambulatory Visit
Admission: RE | Admit: 2018-03-03 | Discharge: 2018-03-03 | Disposition: A | Discharge status: Home / Self Care | Payer: Medicare Other | Source: Ambulatory Visit | Attending: Family Medicine | Admitting: Family Medicine

## 2018-03-03 DIAGNOSIS — Z1231 Encounter for screening mammogram for malignant neoplasm of breast: Secondary | ICD-10-CM | POA: Insufficient documentation

## 2018-03-08 ENCOUNTER — Other Ambulatory Visit: Payer: Self-pay | Admitting: Family Medicine

## 2018-03-08 ENCOUNTER — Encounter (INDEPENDENT_AMBULATORY_CARE_PROVIDER_SITE_OTHER): Payer: Medicare Other

## 2018-03-08 ENCOUNTER — Encounter (INDEPENDENT_AMBULATORY_CARE_PROVIDER_SITE_OTHER): Payer: Medicare Other | Admitting: Vascular Surgery

## 2018-03-08 DIAGNOSIS — R222 Localized swelling, mass and lump, trunk: Secondary | ICD-10-CM

## 2018-03-11 ENCOUNTER — Other Ambulatory Visit: Payer: Self-pay | Admitting: Family Medicine

## 2018-03-11 ENCOUNTER — Ambulatory Visit
Admission: RE | Admit: 2018-03-11 | Discharge: 2018-03-11 | Disposition: A | Discharge status: Home / Self Care | Payer: Medicare Other | Source: Ambulatory Visit | Attending: Family Medicine | Admitting: Family Medicine

## 2018-03-11 ENCOUNTER — Encounter (INDEPENDENT_AMBULATORY_CARE_PROVIDER_SITE_OTHER): Payer: Medicare Other

## 2018-03-11 ENCOUNTER — Encounter (INDEPENDENT_AMBULATORY_CARE_PROVIDER_SITE_OTHER): Payer: Medicare Other | Admitting: Vascular Surgery

## 2018-03-11 DIAGNOSIS — N6320 Unspecified lump in the left breast, unspecified quadrant: Secondary | ICD-10-CM

## 2018-03-11 DIAGNOSIS — R222 Localized swelling, mass and lump, trunk: Secondary | ICD-10-CM | POA: Diagnosis not present

## 2018-03-11 DIAGNOSIS — IMO0002 Reserved for concepts with insufficient information to code with codable children: Secondary | ICD-10-CM

## 2018-03-11 DIAGNOSIS — R229 Localized swelling, mass and lump, unspecified: Principal | ICD-10-CM

## 2018-03-12 ENCOUNTER — Other Ambulatory Visit: Payer: Self-pay | Admitting: Family Medicine

## 2018-03-12 DIAGNOSIS — N6320 Unspecified lump in the left breast, unspecified quadrant: Secondary | ICD-10-CM

## 2018-03-12 DIAGNOSIS — R928 Other abnormal and inconclusive findings on diagnostic imaging of breast: Secondary | ICD-10-CM

## 2018-03-17 ENCOUNTER — Ambulatory Visit
Admission: RE | Admit: 2018-03-17 | Discharge: 2018-03-17 | Disposition: A | Discharge status: Home / Self Care | Payer: Medicare Other | Source: Ambulatory Visit | Attending: Family Medicine | Admitting: Family Medicine

## 2018-03-17 DIAGNOSIS — R928 Other abnormal and inconclusive findings on diagnostic imaging of breast: Secondary | ICD-10-CM

## 2018-03-17 DIAGNOSIS — N6320 Unspecified lump in the left breast, unspecified quadrant: Secondary | ICD-10-CM | POA: Diagnosis present

## 2018-03-18 ENCOUNTER — Other Ambulatory Visit: Payer: Self-pay | Admitting: Anatomic Pathology & Clinical Pathology

## 2018-03-22 LAB — SURGICAL PATHOLOGY

## 2018-03-24 ENCOUNTER — Encounter: Payer: Self-pay | Admitting: *Deleted

## 2018-03-24 NOTE — Progress Notes (Signed)
  Oncology Nurse Navigator Documentation  Navigator Location: [facility] (03/24/18 1300)   )Navigator Encounter Type: Introductory phone call (03/24/18 1300)   Abnormal Finding Date: 03/11/18 (03/24/18 1300) Confirmed Diagnosis Date: 03/18/18 (03/24/18 1300)                   Barriers/Navigation Needs: Coordination of Care (03/24/18 1300)                          Time Spent with Patient: 15 (03/24/18 1300)   Patient newly diagnosed with invasive mammary carcinoma.  Called patient to establish navigation services.  No answer.  Left message for her to return my call.

## 2018-03-28 DIAGNOSIS — C50919 Malignant neoplasm of unspecified site of unspecified female breast: Secondary | ICD-10-CM

## 2018-03-28 HISTORY — DX: Malignant neoplasm of unspecified site of unspecified female breast: C50.919

## 2018-03-29 ENCOUNTER — Other Ambulatory Visit: Payer: Self-pay | Admitting: Family Medicine

## 2018-03-29 ENCOUNTER — Encounter: Payer: Self-pay | Admitting: *Deleted

## 2018-03-29 DIAGNOSIS — C50919 Malignant neoplasm of unspecified site of unspecified female breast: Secondary | ICD-10-CM

## 2018-03-29 NOTE — Progress Notes (Signed)
Oncology Nurse Navigator Documentation    Oncology Nurse Navigator Documentation  Navigator Location: [facility] (03/29/18 1300) Referral date to RadOnc/MedOnc: 03/31/18 (03/29/18 1300) )Navigator Encounter Type: Introductory phone call (03/29/18 1300)                         Barriers/Navigation Needs: Coordination of Care (03/29/18 1300)   Interventions: Coordination of Care (03/29/18 1300)   Coordination of Care: Appts (03/29/18 1300)                  Time Spent with Patient: 45 (03/29/18 1300)   Patient returned my call today.  She was out of town last week when I called her.  States she received her biopsy results from Dr. Everlene Balls and the radiologist.  States she is nervous, but had the feeling it would be cancer by the way it felt.  Reviewed navigation services.  I have scheduled her to see Dr. Lysle Pearl for surgical consultation on 03/31/18 @ 8:00 and Dr. Tasia Catchings for medical oncology on 03/31/18 @ 10:45.  Will plan to meet her Wednesday and give educational literature at that time.  She is to call with any questions or needs.

## 2018-03-31 ENCOUNTER — Other Ambulatory Visit: Payer: Self-pay

## 2018-03-31 ENCOUNTER — Inpatient Hospital Stay: Payer: Medicare Other

## 2018-03-31 ENCOUNTER — Encounter: Payer: Self-pay | Admitting: Oncology

## 2018-03-31 ENCOUNTER — Inpatient Hospital Stay: Payer: Medicare Other | Attending: Oncology | Admitting: Oncology

## 2018-03-31 VITALS — BP 169/72 | HR 67 | Temp 97.2°F | Ht 67.0 in | Wt 166.1 lb

## 2018-03-31 DIAGNOSIS — Z7984 Long term (current) use of oral hypoglycemic drugs: Secondary | ICD-10-CM | POA: Diagnosis not present

## 2018-03-31 DIAGNOSIS — Z803 Family history of malignant neoplasm of breast: Secondary | ICD-10-CM

## 2018-03-31 DIAGNOSIS — C50312 Malignant neoplasm of lower-inner quadrant of left female breast: Secondary | ICD-10-CM | POA: Diagnosis present

## 2018-03-31 DIAGNOSIS — Z7982 Long term (current) use of aspirin: Secondary | ICD-10-CM | POA: Diagnosis not present

## 2018-03-31 DIAGNOSIS — Z79899 Other long term (current) drug therapy: Secondary | ICD-10-CM | POA: Diagnosis not present

## 2018-03-31 DIAGNOSIS — I10 Essential (primary) hypertension: Secondary | ICD-10-CM

## 2018-03-31 DIAGNOSIS — Z170 Estrogen receptor positive status [ER+]: Principal | ICD-10-CM

## 2018-03-31 DIAGNOSIS — E119 Type 2 diabetes mellitus without complications: Secondary | ICD-10-CM

## 2018-03-31 DIAGNOSIS — D509 Iron deficiency anemia, unspecified: Secondary | ICD-10-CM

## 2018-03-31 DIAGNOSIS — E871 Hypo-osmolality and hyponatremia: Secondary | ICD-10-CM

## 2018-03-31 DIAGNOSIS — C50919 Malignant neoplasm of unspecified site of unspecified female breast: Secondary | ICD-10-CM

## 2018-03-31 LAB — CBC WITH DIFFERENTIAL/PLATELET
ABS IMMATURE GRANULOCYTES: 0.01 10*3/uL (ref 0.00–0.07)
BASOS PCT: 1 %
Basophils Absolute: 0 10*3/uL (ref 0.0–0.1)
Eosinophils Absolute: 0 10*3/uL (ref 0.0–0.5)
Eosinophils Relative: 1 %
HCT: 32.4 % — ABNORMAL LOW (ref 36.0–46.0)
Hemoglobin: 10.4 g/dL — ABNORMAL LOW (ref 12.0–15.0)
IMMATURE GRANULOCYTES: 0 %
Lymphocytes Relative: 20 %
Lymphs Abs: 1 10*3/uL (ref 0.7–4.0)
MCH: 23.7 pg — ABNORMAL LOW (ref 26.0–34.0)
MCHC: 32.1 g/dL (ref 30.0–36.0)
MCV: 74 fL — ABNORMAL LOW (ref 80.0–100.0)
Monocytes Absolute: 0.7 10*3/uL (ref 0.1–1.0)
Monocytes Relative: 14 %
NEUTROS PCT: 64 %
NRBC: 0 % (ref 0.0–0.2)
Neutro Abs: 3.2 10*3/uL (ref 1.7–7.7)
PLATELETS: 297 10*3/uL (ref 150–400)
RBC: 4.38 MIL/uL (ref 3.87–5.11)
RDW: 14.4 % (ref 11.5–15.5)
WBC: 4.9 10*3/uL (ref 4.0–10.5)

## 2018-03-31 LAB — COMPREHENSIVE METABOLIC PANEL
ALT: 19 U/L (ref 0–44)
AST: 19 U/L (ref 15–41)
Albumin: 4.1 g/dL (ref 3.5–5.0)
Alkaline Phosphatase: 57 U/L (ref 38–126)
Anion gap: 10 (ref 5–15)
BUN: 13 mg/dL (ref 8–23)
CHLORIDE: 96 mmol/L — AB (ref 98–111)
CO2: 23 mmol/L (ref 22–32)
CREATININE: 0.77 mg/dL (ref 0.44–1.00)
Calcium: 9.4 mg/dL (ref 8.9–10.3)
GFR calc Af Amer: >60 mL/min (ref >60)
GFR calc non Af Amer: >60 mL/min (ref >60)
Glucose, Bld: 144 mg/dL — ABNORMAL HIGH (ref 70–99)
POTASSIUM: 4.1 mmol/L (ref 3.5–5.1)
SODIUM: 129 mmol/L — AB (ref 135–145)
Total Bilirubin: 0.5 mg/dL (ref 0.3–1.2)
Total Protein: 7.5 g/dL (ref 6.5–8.1)

## 2018-03-31 NOTE — Progress Notes (Signed)
Patient here for initial visit. Patient states being stress due to upcoming surgery. Blood pressure elevated. Pt states she did not take medication this morning.

## 2018-03-31 NOTE — Progress Notes (Signed)
Hematology/Oncology Consult note [facility] [phone] [phone]   Patient Care Team: Dion Body, MD as PCP - General (Family Medicine)  REFERRING PROVIDER:  CHIEF COMPLAINTS/REASON FOR VISIT:  Evaluation of breast cancer  HISTORY OF PRESENTING ILLNESS:  Jackie Allison is a  77 y.o.  female with PMH listed below who was referred to me for evaluation of breast cancer Self palpated left breast mass for 1 month.  Patient had mammogram and ultrasound on 03/11/2018 which showed suspicious left breast mass, indeterminate left aixllary lymph node.  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   Nipple discharge: denies.  Family history: great niece had breast cancer OCP use: denies.  Estrogen and progesterone therapy: denies History of radiation to chest: denies.  Previous breast surgery: previous right breast biopsy.     Review of Systems  Constitutional: Negative for appetite change, chills, fatigue and fever.  HENT:   Negative for hearing loss and voice change.   Eyes: Negative for eye problems.  Respiratory: Negative for chest tightness and cough.   Cardiovascular: Negative for chest pain.  Gastrointestinal: Negative for abdominal distention, abdominal pain and blood in stool.  Endocrine: Negative for hot flashes.  Genitourinary: Negative for difficulty urinating and frequency.   Musculoskeletal: Negative for arthralgias.  Skin: Negative for itching and rash.  Neurological: Negative for extremity weakness.  Hematological: Negative for adenopathy.  Psychiatric/Behavioral: Negative for confusion.    MEDICAL HISTORY:  Past Medical History:  Diagnosis Date  . Diabetes mellitus without complication ([place])   . GERD (gastroesophageal reflux disease)   . Glaucoma   . Hypercholesterolemia   . Hypertension     SURGICAL HISTORY: Past Surgical History:    Procedure Laterality Date  . BACK SURGERY    . BREAST BIOPSY Right    stereo- neg  . EYE SURGERY      SOCIAL HISTORY: Social History   Socioeconomic History  . Marital status: Married    Spouse name: Not on file  . Number of children: Not on file  . Years of education: Not on file  . Highest education level: Not on file  Occupational History  . Not on file  Social Needs  . Financial resource strain: Not on file  . Food insecurity:    Worry: Not on file    Inability: Not on file  . Transportation needs:    Medical: Not on file    Non-medical: Not on file  Tobacco Use  . Smoking status: Never Smoker  . Smokeless tobacco: Never Used  Substance and Sexual Activity  . Alcohol use: Never    Frequency: Never  . Drug use: Never  . Sexual activity: Not on file  Lifestyle  . Physical activity:    Days per week: Not on file    Minutes per session: Not on file  . Stress: Not on file  Relationships  . Social connections:    Talks on phone: Not on file    Gets together: Not on file    Attends religious service: Not on file    Active member of club or organization: Not on file    Attends meetings of clubs or organizations: Not on file    Relationship status: Not on file  . Intimate partner violence:    Fear of current or ex partner: Not on file    Emotionally abused: Not on file    Physically abused: Not on file  Forced sexual activity: Not on file  Other Topics Concern  . Not on file  Social History Narrative  . Not on file    FAMILY HISTORY: Family History  Problem Relation Age of Onset  . Diabetes Sister   . Diabetes Brother   . Diabetes Sister   . Breast cancer Other   . Hypertension Mother   . Glaucoma Mother   . Heart attack Father     ALLERGIES:  is allergic to shellfish allergy and sulfa antibiotics.  MEDICATIONS:  Current Outpatient Medications  Medication Sig Dispense Refill  . acetaminophen (TYLENOL) 500 MG tablet Take 500 mg by mouth every 6  (six) hours as needed.     Marland Kitchen aspirin 81 MG chewable tablet Chew 81 mg by mouth daily.     Marland Kitchen atorvastatin (LIPITOR) 10 MG tablet Take 10 mg by mouth daily.     . benazepril (LOTENSIN) 40 MG tablet Take 40 mg by mouth 2 (two) times daily.     . brimonidine (ALPHAGAN) 0.2 % ophthalmic solution Place 1 drop into the right eye 2 (two) times daily.     . Coenzyme Q10 (COQ10 PO) Take 1 tablet by mouth daily.    . hydrALAZINE (APRESOLINE) 25 MG tablet TAKE 2 TABLETS (50 MG TOTAL) BY MOUTH 3 (THREE) TIMES DAILY  3  . ibuprofen (ADVIL,MOTRIN) 600 MG tablet TAKE 1 TABLET BY MOUTH FOUR TIMES A DAY ALTERNATING WITH TYLENOL AS DIRECTED  0  . latanoprost (XALATAN) 0.005 % ophthalmic solution Place 1 drop into both eyes at bedtime.     . metFORMIN (GLUCOPHAGE) 500 MG tablet Take 500 mg by mouth 2 (two) times daily with a meal.     . Multiple Vitamin (MULTI-VITAMINS) TABS Take 1 tablet by mouth daily.     Marland Kitchen omeprazole (PRILOSEC) 40 MG capsule Take 40 mg by mouth daily.     Marland Kitchen Specialty Vitamins Products (MAGNESIUM, AMINO ACID CHELATE,) 133 MG tablet Take 1 tablet by mouth daily.    . carvedilol (COREG) 3.125 MG tablet Take 3.125 mg by mouth 2 (two) times daily with a meal.    . chlorhexidine (PERIDEX) 0.12 % solution Rinse 30ML by mouth 3 times daily after a thorough brushing  99  . fluocinonide (LIDEX) 0.05 % external solution APPLY TO AFFECTED AREA TOPICALLY EVERY DAY  2  . tobramycin-dexamethasone (TOBRADEX) ophthalmic solution INSTILL 1 DROP INTO RIGHT EYE 4 TIMES A DAY  0  . triamcinolone cream (KENALOG) 0.1 % APPLY TO AFFECTED AREA TOPICALLY 2(TWO) TIMES A DAY  0   No current facility-administered medications for this visit.      PHYSICAL EXAMINATION: ECOG PERFORMANCE STATUS: 1 - Symptomatic but completely ambulatory Vitals:   03/31/18 1108  BP: (!) 169/72  Pulse: 67  Temp: (!) 97.2 F (36.2 C)   Filed Weights   03/31/18 1108  Weight: 166 lb 1.6 oz (75.3 kg)    Physical Exam    Constitutional: She is oriented to person, place, and time. No distress.  HENT:  Head: Normocephalic and atraumatic.  Mouth/Throat: Oropharynx is clear and moist.  Eyes: Pupils are equal, round, and reactive to light. EOM are normal. No scleral icterus.  Neck: Normal range of motion. Neck supple.  Cardiovascular: Normal rate, regular rhythm and normal heart sounds.  Pulmonary/Chest: Effort normal. No respiratory distress. She has no wheezes.  Abdominal: Soft. Bowel sounds are normal. She exhibits no distension and no mass. There is no tenderness.  Musculoskeletal: Normal range of motion. She  exhibits no edema or deformity.  Neurological: She is alert and oriented to person, place, and time. No cranial nerve deficit.  Skin: Skin is warm and dry. No rash noted. No erythema.  Psychiatric: She has a normal mood and affect.  Breast exam was performed in seated and lying down position. Palpable Left breast lower quadrant 7 o'clock fixed 2cm breast mass    LABORATORY DATA:  I have reviewed the data as listed Lab Results  Component Value Date   WBC 4.9 03/31/2018   HGB 10.4 (L) 03/31/2018   HCT 32.4 (L) 03/31/2018   MCV 74.0 (L) 03/31/2018   PLT 297 03/31/2018   Recent Labs    10/11/17 0611 03/31/18 1159  NA 133* 129*  K 3.9 4.1  CL 100* 96*  CO2 22 23  GLUCOSE 149* 144*  BUN 21* 13  CREATININE 0.85 0.77  CALCIUM 9.4 9.4  GFRNONAA >60 >60  GFRAA >60 >60  PROT  --  7.5  ALBUMIN  --  4.1  AST  --  19  ALT  --  19  ALKPHOS  --  57  BILITOT  --  0.5   Iron/TIBC/Ferritin/ %Sat No results found for: IRON, TIBC, FERRITIN, IRONPCTSAT   RADIOGRAPHIC STUDIES: I have personally reviewed the radiological images as listed and agreed with the findings in the report. 03/11/2018 Diagnostic Mammogram and [place]. A radiopaque BB was placed at the site of the patient's palpable lump in the lower inner left breast at far posterior depth. A spiculated hyperdense mass is seen deep to the  radiopaque BB along the chest wall. No additional suspicious findings are identified within either breast. Further evaluation with ultrasound was performed. Mammographic images were processed with CAD.  On physical exam, I palpate a firm, fixed 2-3 cm mass along the inframammary fold of the 7 o'clock position. Targeted ultrasound is performed, showing an irregular hypoechoic mass with posterior acoustic shadowing at the 7:30 position 11 cm from the nipple. It measures 2.7 x 2.5 x 2.0 cm. There is associated vascularity. This correlates well with the mammographic finding. Evaluation of the axilla demonstrates a single axillary lymph node with borderline cortical thickening of 0.4 cm. IMPRESSION: 1. Highly suspicious left breast mass corresponding with the patient's palpable lump. Recommendation is for ultrasound-guided biopsy. 2. Indeterminate left axillary lymph node. Recommendation is for ultrasound-guided biopsy.  Pathology CASE: [id number]  PATIENT: [facility]  Surgical Pathology Report  Addendum Reason for Addendum #1: Breast Biomarker Results  SPECIMEN SUBMITTED:  A. Breast, left, 7:30 o'clock 11 cm from nipple; biopsy  B. Lymph node, left axilla; biopsy  DIAGNOSIS:  A. BREAST, LEFT, LOWER INNER AT 7:30 O'CLOCK 11 CM FROM THE NIPPLE;  ULTRASOUND-GUIDED CORE BIOPSY:  - INVASIVE MAMMARY CARCINOMA, NO SPECIAL TYPE.  Size of invasive carcinoma: 12 mm in this sample  Histologic grade of invasive carcinoma: Grade 2            Glandular/tubular differentiation score: 3            Nuclear pleomorphism score: 2            Mitotic rate score: 1            Total score: 6  Ductal carcinoma in situ: Not identified  Lymphovascular invasion: Not identified  ER/PR/HER2: Immunohistochemistry will be performed on block A1, with  reflex to [place] for HER2 2+. The results will be reported in an addendum.  B. LYMPH NODE, LEFT AXILLA; ULTRASOUND-GUIDED  CORE BIOPSY:  -  REACTIVE LYMPH NODE.  - NEGATIVE FOR MALIGNANCY IN THIS SAMPLE.   #Estrogen Receptor (ER) Status: POSITIVE Percentage of cells with nuclear positivity: >90%  Average intensity of staining: Strong  Progesterone Receptor (PR) Status: NEGATIVE (less than 1%),   Internal control cells absent  HER2 (by immunohistochemistry): NEGATIVE (score 1+)  ASSESSMENT & PLAN:  1. Malignant neoplasm of lower-inner quadrant of left breast in female, estrogen receptor positive ([place])   2. Hyponatremia   3. Microcytic anemia    I independently reviewed patient's mammogram and ultrasound and also reviewed biopsy pathology.  I agree with MRI breast for further evaluation of chest wall involvement.  Clinical T2N0, if no chest wall involvement, she can proceed with lumpectomy and sentinel lymph node biopsy. If LN negative, plan to send Oncotypte Dx.  If chest wall involvement, then T4N0, stage III, will consider neoadjuvant chemotherapy.   Check cbc and CMP, CA 15-3, CA 27.29 # Microcytic anemia Lab reviewed. Will add iron panel   #Hyponatremia, chronic. Etiology unknown.   Orders Placed This Encounter  Procedures  . CBC with Differential/Platelet    Standing Status:   Future    Number of Occurrences:   1    Standing Expiration Date:   04/01/2019  . Comprehensive metabolic panel    Standing Status:   Future    Number of Occurrences:   1    Standing Expiration Date:   04/01/2019  . Cancer antigen 15-3    Standing Status:   Future    Number of Occurrences:   1    Standing Expiration Date:   04/01/2019  . Cancer antigen 27.29    Standing Status:   Future    Number of Occurrences:   1    Standing Expiration Date:   04/01/2019    All questions were answered. The patient knows to call the clinic with any problems questions or concerns.  Return of visit: to be determined.  Thank you for this kind referral and the opportunity to participate in the care of this patient. A copy of today's  note is routed to referring provider  Total face to face encounter time for this patient visit was 60 min. >50% of the time was  spent in counseling and coordination of care.    Earlie Server, MD, PhD Hematology Oncology [facility] at [facility] Pager- [phone] 03/31/2018

## 2018-04-01 ENCOUNTER — Other Ambulatory Visit: Payer: Self-pay | Admitting: *Deleted

## 2018-04-01 ENCOUNTER — Ambulatory Visit: Payer: Self-pay | Admitting: Surgery

## 2018-04-01 ENCOUNTER — Other Ambulatory Visit: Payer: Self-pay | Admitting: Surgery

## 2018-04-01 DIAGNOSIS — C50312 Malignant neoplasm of lower-inner quadrant of left female breast: Secondary | ICD-10-CM

## 2018-04-01 LAB — CANCER ANTIGEN 15-3: CA 15-3: 13.5 U/mL (ref 0.0–25.0)

## 2018-04-01 LAB — CANCER ANTIGEN 27.29: CAN 27.29: 14.1 U/mL (ref 0.0–38.6)

## 2018-04-01 NOTE — H&P (View-Only) (Signed)
Subjective:   CC: Malignant neoplasm of lower-inner quadrant of left female breast, unspecified estrogen receptor status (CMS-HCC) [C50.312] HPI:  Jackie Allison is a 77 y.o. female who was referred by Jackie Linthavong, MD for evaluation of above. Change was noted on last screening mammogram. Patient does not routinely do self breast exams.  Patient had noted a change on breast exam. Age of menarche was 11. Age of menopause was 48. Patient denies hormonal therapy. Patient is G2P2. Age of first live birth was 20. Patient did not breast feed. Patient denies nipple discharge. Patient reports previous breast biopsy, right side, benign. Patient does not a personal history of breast cancer.   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  Past Surgical History:  has a past surgical history that includes Back surgery; Tubal ligation; Benign right breast biopsy (11/2002); Benign cyst removal from left wrist (05/2015); Eye surgeries x 3; and WISDOM TEETH.  Family History: family history includes Diabetes type II in her sister and sister; Glaucoma in her maternal aunt, maternal uncle, mother, and son; Heart disease in her sister; Lung cancer in her brother; Myocardial Infarction (Heart attack) in her father.  Social History:  reports that she has never smoked. She has never used smokeless tobacco. She reports that she does not drink alcohol or use drugs.  Current Medications: has a current medication list which includes the following prescription(s): acetaminophen, aspirin, atorvastatin, benazepril, blood glucose  diagnostic, brimonidine, carvedilol, ubiquinol, dorzolamide-timolol, hydralazine, lancets, latanoprost, lorazepam, magnesium, aluminum hydroxide, metformin, multivitamin, omeprazole, onetouch verio mid control, onetouch verio system, and tobramycin-dexamethasone.  Allergies:       Allergies as of 03/31/2018 - Reviewed 03/31/2018  Allergen Reaction Noted  . Iodine Other (See Comments) 10/08/2015  . Shellfish containing products Swelling 11/02/2015  . Sulfa (sulfonamide antibiotics) Rash and Itching 11/02/2015    ROS:  A 15 point review of systems was performed and was negative except as noted in HPI   Objective:   BP 174/73   Pulse 69   Temp 36 C (96.8 F) (Oral)   Ht 167.6 cm (5' 6")   Wt 74.8 kg (165 lb)   LMP  (LMP Unknown)   BMI 26.63 kg/m   Constitutional :  alert, appears stated age, cooperative and no distress  Lymphatics/Throat:  no asymmetry, masses, or scars  Respiratory:  clear to auscultation bilaterally  Cardiovascular:  regular rate and rhythm  Gastrointestinal: soft, non-tender; bowel sounds normal; no masses,  no organomegaly.   Musculoskeletal: Steady gait and movement  Skin: Cool and moist  Psychiatric: Normal affect, non-agitated, not confused  Breast:  Chaperone present for exam. Right breast with no abnormalities visualized or palpated.  Right Axilla noted to have birthmark, irregular, and moderate size, non-raised, but patient states appearance has been stable since birth.  Left breast with visible skin indentation in the medial aspect of inframammary fold, with palpable, non-mobile mass measuring approximately 3cm x 3cm.  Non-tender, no erythema, or tenderness.  Remaining breast and left axilla unremarkable.     LABS:  Reason for Addendum #1:Breast Biomarker Results  SPECIMEN SUBMITTED: A. Breast, left, 7:30 o'clock 11 cm from nipple; biopsy B. Lymph node, left axilla; biopsy  CLINICAL HISTORY: 1. Left breast   with firm, fixed mass along the  inframammary fold 7:30 position 11 cm from nipple, measuring 2.7 x 2.5 x 2.0 cm by ultrasound. 2. 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 Left breast heart-shaped clip could not be brought into view because the area is too far posterior, adjacent to the chest wall. 2. HydroMARK clip is seen in the left axilla     DIAGNOSIS: A. BREAST, LEFT, LOWER INNER AT 7:30 O'CLOCK 11 CM FROM THE NIPPLE; ULTRASOUND-GUIDED CORE BIOPSY: - INVASIVE MAMMARY CARCINOMA, NO SPECIAL TYPE.  Size of invasive carcinoma: 12 mm in this sample Histologic grade of invasive carcinoma: Grade 2  Glandular/tubular differentiation score: 3  Nuclear pleomorphism score: 2  Mitotic rate score: 1  Total score: 6 Ductal carcinoma in situ: Not identified Lymphovascular invasion: Not identified  ER/PR/HER2: Immunohistochemistry will be performed on block A1, with reflex to FISH for HER2 2+. The results will be reported in an addendum.  B. LYMPH NODE, LEFT AXILLA; ULTRASOUND-GUIDED CORE BIOPSY: - REACTIVE LYMPH NODE. - NEGATIVE FOR MALIGNANCY IN THIS SAMPLE.  Comment: The definitive grade of the breast carcinoma will be assigned on the excisional specimen. These findings were communicated to Jackie Allison in Jackie Allison 's office on 03/18/2018 at 2:08 PM. Read back procedure was performed.   GROSS DESCRIPTION: A. Labeled: Left breast 7:30, 11 cm from nipple Received: In formalin Time/date in fixative: 2:14 PM on 03/17/2018 Cold ischemic time: Less than 1 minute Total fixation time: 6.5 Core pieces: 2 Size: 1.1-1.4 cm in length and 0.1 cm in diameter Description: Yellow to pink cores Ink color: Blue Entirely submitted in one cassette  B. Labeled: Left axilla lymph node Received: In formalin Time/date in fixative: 2:45  PM on 03/17/2018 Cold ischemic time: Less than 1 minute Total fixation time: 6 hours Core pieces: 2 Size: 1.2-1.8 cm in length and 0.1 cm in diameter Description: Pink-yellow fibrofatty fragments Ink color: Green Entirely submitted in one cassette.   Final Diagnosis performed by Jackie Olney, MD. Electronically signed 03/18/2018 3:50:30PM The electronic signature indicates that the named Attending Pathologist has evaluated the specimen  Technical component performed at [facility], [address], [place], [place] [address] Lab: [phone] Dir: Sanjai Nagendra, MD, MMMProfessional component performed at [facility], [facility], [address], [place], [place] [address] Lab: [phone] Dir: Tara C. Rubinas, MD  ADDENDUM: BREAST BIOMARKER TESTS Estrogen Receptor (ER) Status: POSITIVE  Percentage of cells with nuclear positivity: >90%  Average intensity of staining: Strong  Progesterone Receptor (PR) Status: NEGATIVE (less than 1%)  Internal control cells absent  HER2 (by immunohistochemistry): NEGATIVE (score 1+)  Cold Ischemia and Fixation Times: Meet requirements specified in latest version of the ASCO/CAP guidelines Testing Performed on Block Number(s): A1  METHODS Fixative: Formalin Estrogen Receptor:FDA cleared ([facility]) Primary Antibody:SP1 Progesterone Receptor: FDA cleared ([facility]) Primary Antibody: 1E2 HER2 (by IHC): FDA cleared ([facility]) Primary Antibody: 4B5 ([facility]) Immunohistochemistry controls worked appropriately. Slides were prepared by [facility], [place], [place], and interpreted by Dr. Olney.  This test was developed and its performance characteristics determined by [facility]. It has not been cleared or approved by the US Food and Drug Administration. The FDA does not require this test to go through premarket FDA review. This test is used for clinical purposes. It should not be  regarded as investigational or for research. This laboratory is certified under the Clinical Laboratory Improvement Amendments (CLIA) as qualified to perform high complexity clinical   laboratory testing.    Addendum #1 performed by Jackie Olney, MD. Electronically signed 03/22/2018 3:58:04PM The electronic signature indicates that the named Attending Pathologist has evaluated the specimen  Technical component performed at [facility], [address], [place], [place] [address] Lab: [phone] Dir: Sanjai Nagendra, MD, MMMProfessional component performed at [facility], [facility], [address], [place], [place] [address] Lab: [phone] Dir: Tara C. Rubinas, MD  RADS: Addendum by Allison, Serena M, MD on 03/24/2018 11:06 AM ADDENDUM REPORT: 03/24/2018 10:54  ADDENDUM: ADDITIONAL RECOMMENDATION: Per Jackie Allison, it is recommended that the patient have a bilateral breast MRI without and with contrast due to the deep location of the mass and possible chest wall involvement.  Addendum by Joann Yokley, RRA on 03/24/18.   Electronically Signed By: SerenaChacko M.D. On: 03/24/2018 10:54Addendum by Hu, Jeffrey, MD on 03/22/2018  5:03 PM ADDENDUM REPORT: 03/22/2018 17:00  ADDENDUM: PATHOLOGY:  LEFT breast: Invasive mammary carcinoma.  LEFT axillary lymph node: Reactive lymph node. Negative for malignancy.  CONCORDANT: YES  Per primary physician office request, I telephoned the patient on 03/22/2018 at 4:30 p.m. and discussed these results and the recommendations stated below. All questions were answered. The patient denies significant pain or bleeding from the biopsy site. Biopsy site care instructions were reviewed and the patient was asked to call me or [facility] with any questions or issues related to the biopsy.  RECOMMENDATION:Surgery/oncology consultation.   Electronically Signed By: JeffreyHu M.D. On: 03/22/2018 17:00   Result Narrative  CLINICAL DATA:77-year-old female with a highly suspicious left breast mass and borderline left axillary lymph node.  EXAM: ULTRASOUND GUIDED LEFT BREAST CORE NEEDLE BIOPSY  ULTRASOUND-GUIDED LEFT AXILLARY LYMPH NODE CORE NEEDLE BIOPSY  COMPARISON:Previous exam(s).  FINDINGS: I met with the patient and we discussed the procedure of ultrasound-guided biopsy, including benefits and alternatives. We discussed the high likelihood of a successful procedure. We discussed the risks of the procedure, including infection, bleeding, tissue injury, clip migration, and inadequate sampling. Informed written consent was given. The usual time-out protocol was performed immediately prior to the procedure.  Lesion quadrant: Lower inner quadrant  Using sterile technique and 1% Lidocaine as local anesthetic, under direct ultrasound visualization, a 14 gauge spring-loaded device was used to perform biopsy of the left breast mass using a lateral approach. At the conclusion of the procedure a heart shaped tissue marker clip was deployed into the biopsy cavity.  Using sterile technique and 1% Lidocaine as local anesthetic, under direct ultrasound visualization, a 14 gauge spring-loaded device was used to perform biopsy of a left axillary lymph node using a lateral approach. At the conclusion of the procedure a HydroMARK tissue marker clip was deployed into the biopsy cavity.  Follow up 2 view mammogram was performed and dictated separately.  IMPRESSION: Ultrasound guided biopsy of a left breast mass and left axillary lymph node. No apparent complications.  Electronically Signed: By: SerenaChacko M.D. On: 03/17/2018 15:27  Other Result Information  Interface, Rad Results In - 03/24/2018 11:06 AM EST CLINICAL DATA:  77-year-old female with a highly suspicious left breast mass and borderline left axillary lymph node.  EXAM: ULTRASOUND GUIDED LEFT BREAST CORE NEEDLE  BIOPSY  ULTRASOUND-GUIDED LEFT AXILLARY LYMPH NODE CORE NEEDLE BIOPSY  COMPARISON:  Previous exam(s).  FINDINGS: I met with the patient and we discussed the procedure of ultrasound-guided biopsy, including benefits and alternatives. We discussed the high likelihood of a successful procedure. We discussed the risks of the procedure, including infection, bleeding, tissue injury, clip   migration, and inadequate sampling. Informed written consent was given. The usual time-out protocol was performed immediately prior to the procedure.  Lesion quadrant: Lower inner quadrant  Using sterile technique and 1% Lidocaine as local anesthetic, under direct ultrasound visualization, a 14 gauge spring-loaded device was used to perform biopsy of the left breast mass using a lateral approach. At the conclusion of the procedure a heart shaped tissue marker clip was deployed into the biopsy cavity.  Using sterile technique and 1% Lidocaine as local anesthetic, under direct ultrasound visualization, a 14 gauge spring-loaded device was used to perform biopsy of a left axillary lymph node using a lateral approach. At the conclusion of the procedure a HydroMARK tissue marker clip was deployed into the biopsy cavity.  Follow up 2 view mammogram was performed and dictated separately.  IMPRESSION: Ultrasound guided biopsy of a left breast mass and left axillary lymph node. No apparent complications.  Electronically Signed: By: Kristopher Oppenheim M.D. On: 03/17/2018 15:27     Assessment:   Malignant neoplasm of lower-inner quadrant of left female breast, unspecified estrogen receptor status (CMS-HCC) [C50.312]  Plan:   1. Malignant neoplasm of lower-inner quadrant of left female breast, unspecified estrogen receptor status (CMS-HCC) [C50.312]  Discussed the risk of surgery including recurrence, chronic pain, post-op infxn, poor/delayed wound healing, poor cosmesis, seroma, hematoma formation,  and possible re-operation to address said risks. The risks of general anesthetic, if used, includes MI, CVA, sudden death or even reaction to anesthetic medications also discussed.  Typical post-op recovery time and possbility of activity restrictions were also discussed.  Alternatives include continued observation.  Benefits include possible symptom relief, pathologic evaluation, and/or curative excision.   The patient verbalized understanding and all questions were answered to the patient's satisfaction.  DM hx places her at increased risk of wound complications.  Continue home meds for now.  2. Patient has elected to proceed with surgical treatment. Procedure will be scheduled.  Written consent was obtained.  Scheduled after MRI to see if tumor extends into chest wall cavity.     Electronically signed by Benjamine Sprague, DO on 03/31/2018 9:29 AM

## 2018-04-01 NOTE — H&P (Signed)
Subjective:   CC: Malignant neoplasm of lower-inner quadrant of left female breast, unspecified estrogen receptor status (CMS-HCC) [C50.312] HPI:  Jackie Allison is a 77 y.o. female who was referred by Jackie Body, MD for evaluation of above. Change was noted on last screening mammogram. Patient does not routinely do self breast exams.  Patient had noted a change on breast exam. Age of menarche was 10. Age of menopause was 24. Patient denies hormonal therapy. Patient is G2P2. Age of first live birth was 11. Patient did not breast feed. Patient denies nipple discharge. Patient reports previous breast biopsy, right side, benign. Patient does not a personal history of breast cancer.   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  Past Surgical History:  has a past surgical history that includes Back surgery; Tubal ligation; Benign right breast biopsy (11/2002); Benign cyst removal from left wrist (05/2015); Eye surgeries x 3; and WISDOM TEETH.  Family History: family history includes Diabetes type II in her sister and sister; Glaucoma in her maternal aunt, maternal uncle, mother, and son; Heart disease in her sister; Lung cancer in her brother; Myocardial Infarction (Heart attack) in her father.  Social History:  reports that she has never smoked. She has never used smokeless tobacco. She reports that she does not drink alcohol or use drugs.  Current Medications: has a current medication list which includes the following prescription(s): acetaminophen, aspirin, atorvastatin, benazepril, blood glucose  diagnostic, brimonidine, carvedilol, ubiquinol, dorzolamide-timolol, hydralazine, lancets, latanoprost, lorazepam, magnesium, aluminum hydroxide, metformin, multivitamin, omeprazole, onetouch verio mid control, onetouch verio system, and tobramycin-dexamethasone.  Allergies:       Allergies as of 03/31/2018 - Reviewed 03/31/2018  Allergen Reaction Noted  . Iodine Other (See Comments) 10/08/2015  . Shellfish containing products Swelling 11/02/2015  . Sulfa (sulfonamide antibiotics) Rash and Itching 11/02/2015    ROS:  A 15 point review of systems was performed and was negative except as noted in HPI   Objective:   BP 174/73   Pulse 69   Temp 36 C (96.8 F) (Oral)   Ht 167.6 cm (5' 6")   Wt 74.8 kg (165 lb)   LMP  (LMP Unknown)   BMI 26.63 kg/m   Constitutional :  alert, appears stated age, cooperative and no distress  Lymphatics/Throat:  no asymmetry, masses, or scars  Respiratory:  clear to auscultation bilaterally  Cardiovascular:  regular rate and rhythm  Gastrointestinal: soft, non-tender; bowel sounds normal; no masses,  no organomegaly.   Musculoskeletal: Steady gait and movement  Skin: Cool and moist  Psychiatric: Normal affect, non-agitated, not confused  Breast:  Chaperone present for exam. Right breast with no abnormalities visualized or palpated.  Right Axilla noted to have birthmark, irregular, and moderate size, non-raised, but patient states appearance has been stable since birth.  Left breast with visible skin indentation in the medial aspect of inframammary fold, with palpable, non-mobile mass measuring approximately 3cm x 3cm.  Non-tender, no erythema, or tenderness.  Remaining breast and left axilla unremarkable.     LABS:  Reason for Addendum #1:Breast Biomarker Results  SPECIMEN SUBMITTED: A. Breast, left, 7:30 o'clock 11 cm from nipple; biopsy B. Lymph node, left axilla; biopsy  CLINICAL HISTORY: 1. Left breast  with firm, fixed mass along the  inframammary fold 7:30 position 11 cm from nipple, measuring 2.7 x 2.5 x 2.0 cm by ultrasound. 2. Indeterminate left axillary lymph node  PRE-OPERATIVE DIAGNOSIS: Concerning for metastatic [facility]  POST-OPERATIVE DIAGNOSIS: Post biopsy mammogram: 1. Left breast heart-shaped clip could not be brought into view because the area is too far posterior, adjacent to the chest wall. 2. HydroMARK clip is seen in the left axilla     DIAGNOSIS: A. BREAST, LEFT, LOWER INNER AT 7:30 O'CLOCK 11 CM FROM THE NIPPLE; ULTRASOUND-GUIDED CORE BIOPSY: - INVASIVE MAMMARY CARCINOMA, NO SPECIAL TYPE.  Size of invasive carcinoma: 12 mm in this sample Histologic grade of invasive carcinoma: Grade 2  Glandular/tubular differentiation score: 3  Nuclear pleomorphism score: 2  Mitotic rate score: 1  Total score: 6 Ductal carcinoma in situ: Not identified Lymphovascular invasion: Not identified  ER/PR/HER2: Immunohistochemistry will be performed on block A1, with reflex to [place] for HER2 2+. The results will be reported in an addendum.  B. LYMPH NODE, LEFT AXILLA; ULTRASOUND-GUIDED CORE BIOPSY: - REACTIVE LYMPH NODE. - NEGATIVE FOR MALIGNANCY IN THIS SAMPLE.  Comment: The definitive grade of the breast carcinoma will be assigned on the excisional specimen. These findings were communicated to Jackie Allison in Dr. Jeanmarie Allison 's office on 03/18/2018 at 2:08 PM. Read back procedure was performed.   GROSS DESCRIPTION: A. Labeled: Left breast 7:30, 11 cm from nipple Received: In formalin Time/date in fixative: 2:14 PM on 03/17/2018 Cold ischemic time: Less than 1 minute Total fixation time: 6.5 Core pieces: 2 Size: 1.1-1.4 cm in length and 0.1 cm in diameter Description: Yellow to pink cores Ink color: Blue Entirely submitted in one cassette  B. Labeled: Left axilla lymph node Received: In formalin Time/date in fixative: 2:45  PM on 03/17/2018 Cold ischemic time: Less than 1 minute Total fixation time: 6 hours Core pieces: 2 Size: 1.2-1.8 cm in length and 0.1 cm in diameter Description: Pink-yellow fibrofatty fragments Ink color: Green Entirely submitted in one cassette.   Final Diagnosis performed by Bryan Lemma, MD. Electronically signed 03/18/2018 3:50:30PM The electronic signature indicates that the named Attending Pathologist has evaluated the specimen  Technical component performed at [facility], [address] [place], [place] [address] Lab: [phone] Dir: Rush Farmer, MD, MMMProfessional component performed at [facility], [place] [facility], [place], [place], [place] [address] Lab: [phone] Dir: Dellia Nims. Rubinas, MD  ADDENDUM: BREAST BIOMARKER TESTS Estrogen Receptor (ER) Status: POSITIVE  Percentage of cells with nuclear positivity: >90%  Average intensity of staining: Strong  Progesterone Receptor (PR) Status: NEGATIVE (less than 1%)  Internal control cells absent  HER2 (by immunohistochemistry): NEGATIVE (score 1+)  Cold Ischemia and Fixation Times: Meet requirements specified in latest version of the ASCO/CAP guidelines Testing Performed on Block Number(s): A1  METHODS Fixative: Formalin Estrogen Receptor:FDA cleared ([place]) Primary Antibody:SP1 Progesterone Receptor: FDA cleared ([place]) Primary Antibody: 1E2 HER2 (by IHC): FDA cleared ([place]) Primary Antibody: 4B5 ([facility]) Immunohistochemistry controls worked appropriately. Slides were prepared by [facility], [place], [place], and interpreted by Dr. Dicie Beam.  This test was developed and its performance characteristics determined by [facility]. It has not been cleared or approved by the [place] Food and Drug Administration. The FDA does not require this test to go through premarket FDA review. This test is used for clinical purposes. It should not be  regarded as investigational or for research. This laboratory is certified under the Clinical Laboratory Improvement Amendments (CLIA) as qualified to perform high complexity clinical  laboratory testing.    Addendum #1 performed by Bryan Lemma, MD. Electronically signed 03/22/2018 3:58:04PM The electronic signature indicates that the named Attending Pathologist has evaluated the specimen  Technical component performed at [facility], [address] [place], [place] [address] Lab: [phone] Dir: Rush Farmer, MD, MMMProfessional component performed at [facility], [facility], [place], [place],  [address] Lab: [phone] Dir: Dellia Nims. Reuel Derby, MD  RADS: Addendum by Shayne Alken, MD on 03/24/2018 11:06 AM ADDENDUM REPORT: 03/24/2018 10:54  ADDENDUM: ADDITIONAL RECOMMENDATION: Per Dr. Jeanmarie Allison, it is recommended that the patient have a bilateral breast MRI without and with contrast due to the deep location of the mass and possible chest wall involvement.  Addendum by Jetta Lout, RRA on 03/24/18.   Electronically Signed ByMaricela Bo M.D. On: 03/24/2018 10:54Addendum by Margarette Canada, MD on 03/22/2018  5:03 PM ADDENDUM REPORT: 03/22/2018 17:00  ADDENDUM: PATHOLOGY:  LEFT breast: Invasive mammary carcinoma.  LEFT axillary lymph node: Reactive lymph node. Negative for malignancy.  CONCORDANT: YES  Per primary physician office request, I telephoned the patient on 03/22/2018 at 4:30 p.m. and discussed these results and the recommendations stated below. All questions were answered. The patient denies significant pain or bleeding from the biopsy site. Biopsy site care instructions were reviewed and the patient was asked to call me or [place] with any questions or issues related to the biopsy.  RECOMMENDATION:Surgery/oncology consultation.   Electronically Signed ByEfraim Kaufmann M.D. On: 03/22/2018 17:00   Result Narrative  CLINICAL DATA:77 year old female with a highly suspicious left breast mass and borderline left axillary lymph node.  EXAM: ULTRASOUND GUIDED LEFT BREAST CORE NEEDLE BIOPSY  ULTRASOUND-GUIDED LEFT AXILLARY LYMPH NODE CORE NEEDLE BIOPSY  COMPARISON:Previous exam(s).  FINDINGS: I met with the patient and we discussed the procedure of ultrasound-guided biopsy, including benefits and alternatives. We discussed the high likelihood of a successful procedure. We discussed the risks of the procedure, including infection, bleeding, tissue injury, clip migration, and inadequate sampling. Informed written consent was given. The usual time-out protocol was performed immediately prior to the procedure.  Lesion quadrant: Lower inner quadrant  Using sterile technique and 1% Lidocaine as local anesthetic, under direct ultrasound visualization, a 14 gauge spring-loaded device was used to perform biopsy of the left breast mass using a lateral approach. At the conclusion of the procedure a heart shaped tissue marker clip was deployed into the biopsy cavity.  Using sterile technique and 1% Lidocaine as local anesthetic, under direct ultrasound visualization, a 14 gauge spring-loaded device was used to perform biopsy of a left axillary lymph node using a lateral approach. At the conclusion of the procedure a HydroMARK tissue marker clip was deployed into the biopsy cavity.  Follow up 2 view mammogram was performed and dictated separately.  IMPRESSION: Ultrasound guided biopsy of a left breast mass and left axillary lymph node. No apparent complications.  Electronically Signed: ByMaricela Bo M.D. On: 03/17/2018 15:27  Other Result Information  Interface, Rad Results In - 03/24/2018 11:06 AM EST CLINICAL DATA:  77 year old female with a highly suspicious left breast mass and borderline left axillary lymph node.  EXAM: ULTRASOUND GUIDED LEFT BREAST CORE NEEDLE  BIOPSY  ULTRASOUND-GUIDED LEFT AXILLARY LYMPH NODE CORE NEEDLE BIOPSY  COMPARISON:  Previous exam(s).  FINDINGS: I met with the patient and we discussed the procedure of ultrasound-guided biopsy, including benefits and alternatives. We discussed the high likelihood of a successful procedure. We discussed the risks of the procedure, including infection, bleeding, tissue injury, clip  migration, and inadequate sampling. Informed written consent was given. The usual time-out protocol was performed immediately prior to the procedure.  Lesion quadrant: Lower inner quadrant  Using sterile technique and 1% Lidocaine as local anesthetic, under direct ultrasound visualization, a 14 gauge spring-loaded device was used to perform biopsy of the left breast mass using a lateral approach. At the conclusion of the procedure a heart shaped tissue marker clip was deployed into the biopsy cavity.  Using sterile technique and 1% Lidocaine as local anesthetic, under direct ultrasound visualization, a 14 gauge spring-loaded device was used to perform biopsy of a left axillary lymph node using a lateral approach. At the conclusion of the procedure a HydroMARK tissue marker clip was deployed into the biopsy cavity.  Follow up 2 view mammogram was performed and dictated separately.  IMPRESSION: Ultrasound guided biopsy of a left breast mass and left axillary lymph node. No apparent complications.  Electronically Signed: By: Kristopher Oppenheim M.D. On: 03/17/2018 15:27     Assessment:   Malignant neoplasm of lower-inner quadrant of left female breast, unspecified estrogen receptor status (CMS-HCC) [C50.312]  Plan:   1. Malignant neoplasm of lower-inner quadrant of left female breast, unspecified estrogen receptor status (CMS-HCC) [C50.312]  Discussed the risk of surgery including recurrence, chronic pain, post-op infxn, poor/delayed wound healing, poor cosmesis, seroma, hematoma formation,  and possible re-operation to address said risks. The risks of general anesthetic, if used, includes MI, CVA, sudden death or even reaction to anesthetic medications also discussed.  Typical post-op recovery time and possbility of activity restrictions were also discussed.  Alternatives include continued observation.  Benefits include possible symptom relief, pathologic evaluation, and/or curative excision.   The patient verbalized understanding and all questions were answered to the patient's satisfaction.  DM hx places her at increased risk of wound complications.  Continue home meds for now.  2. Patient has elected to proceed with surgical treatment. Procedure will be scheduled.  Written consent was obtained.  Scheduled after MRI to see if tumor extends into chest wall cavity.     Electronically signed by Benjamine Sprague, DO on 03/31/2018 9:29 AM

## 2018-04-02 ENCOUNTER — Other Ambulatory Visit: Payer: Self-pay | Admitting: Cardiology

## 2018-04-02 DIAGNOSIS — R600 Localized edema: Secondary | ICD-10-CM

## 2018-04-02 DIAGNOSIS — I10 Essential (primary) hypertension: Secondary | ICD-10-CM

## 2018-04-03 HISTORY — PX: BREAST LUMPECTOMY: SHX2

## 2018-04-03 HISTORY — PX: BREAST EXCISIONAL BIOPSY: SUR124

## 2018-04-06 ENCOUNTER — Ambulatory Visit
Admission: RE | Admit: 2018-04-06 | Discharge: 2018-04-06 | Disposition: A | Discharge status: Home / Self Care | Payer: Medicare Other | Source: Ambulatory Visit | Attending: Cardiology | Admitting: Cardiology

## 2018-04-06 ENCOUNTER — Ambulatory Visit
Admission: RE | Admit: 2018-04-06 | Discharge: 2018-04-06 | Disposition: A | Discharge status: Home / Self Care | Payer: Medicare Other | Source: Ambulatory Visit | Attending: Family Medicine | Admitting: Family Medicine

## 2018-04-06 ENCOUNTER — Other Ambulatory Visit: Payer: Self-pay | Admitting: Cardiology

## 2018-04-06 DIAGNOSIS — I10 Essential (primary) hypertension: Secondary | ICD-10-CM | POA: Insufficient documentation

## 2018-04-06 DIAGNOSIS — C50919 Malignant neoplasm of unspecified site of unspecified female breast: Secondary | ICD-10-CM

## 2018-04-06 DIAGNOSIS — R599 Enlarged lymph nodes, unspecified: Secondary | ICD-10-CM | POA: Insufficient documentation

## 2018-04-06 DIAGNOSIS — C50912 Malignant neoplasm of unspecified site of left female breast: Secondary | ICD-10-CM | POA: Insufficient documentation

## 2018-04-06 DIAGNOSIS — R600 Localized edema: Secondary | ICD-10-CM | POA: Insufficient documentation

## 2018-04-06 MED ORDER — GADOBUTROL 1 MMOL/ML IV SOLN
7.5000 mL | Freq: Once | INTRAVENOUS | Status: AC | PRN
  Administered 2018-04-06: 7.5 mL via INTRAVENOUS

## 2018-04-07 ENCOUNTER — Encounter
Admission: RE | Admit: 2018-04-07 | Discharge: 2018-04-07 | Disposition: A | Discharge status: Home / Self Care | Payer: Medicare Other | Source: Ambulatory Visit | Attending: Surgery | Admitting: Surgery

## 2018-04-07 ENCOUNTER — Other Ambulatory Visit: Payer: Self-pay

## 2018-04-07 DIAGNOSIS — C50912 Malignant neoplasm of unspecified site of left female breast: Secondary | ICD-10-CM | POA: Insufficient documentation

## 2018-04-07 DIAGNOSIS — Z01818 Encounter for other preprocedural examination: Secondary | ICD-10-CM | POA: Insufficient documentation

## 2018-04-07 HISTORY — DX: Anemia, unspecified: D64.9

## 2018-04-07 HISTORY — DX: Malignant (primary) neoplasm, unspecified: C80.1

## 2018-04-07 NOTE — Pre-Procedure Instructions (Signed)
Clearance request faxed to Dr. Ubaldo Glassing.

## 2018-04-07 NOTE — Patient Instructions (Addendum)
Your procedure is scheduled on:  Tuesday, April 13, 2018 Report to the [facility] as instructed.  REMEMBER: Instructions that are not followed completely may result in serious medical risk, up to and including death; or upon the discretion of your surgeon and anesthesiologist your surgery may need to be rescheduled.  Do not eat food after midnight the night before surgery.  No gum chewing, lozengers or hard candies.  You may however, drink water up to 2 hours before you are scheduled to arrive for your surgery. Do not drink anything within 2 hours of the start of your surgery.  No Alcohol for 24 hours before or after surgery.  No Smoking including e-cigarettes for 24 hours prior to surgery.  No chewable tobacco products for at least 6 hours prior to surgery.  No nicotine patches on the day of surgery.  On the morning of surgery brush your teeth with toothpaste and water, you may rinse your mouth with mouthwash if you wish. Do not swallow any toothpaste or mouthwash.  Notify your doctor if there is any change in your medical condition (cold, fever, infection).  Do not wear jewelry, make-up, hairpins, clips or nail polish.  Do not wear lotions, powders, or perfumes.   Do not shave 48 hours prior to surgery.   Contacts and dentures may not be worn into surgery.  Do not bring valuables to the hospital, including drivers license, insurance or credit cards.  [facility] is not responsible for any belongings or valuables.   TAKE THESE MEDICATIONS THE MORNING OF SURGERY:  1.  Brimonidine (Alphagan) eye drop 2.  Carvedilol (Coreg) 3.  Cosopt eye drop 4.  Hydralazine 5.  Omeprazole - (take one the night before and one on the morning of surgery - helps to prevent nausea after surgery.)  Use CHG Soap as directed on instruction sheet.  Stop Metformin 2 days prior to surgery. Last day to take is Saturday, December 14; resume after surgery.  NOW!  Stop aspirin and  Anti-inflammatories (NSAIDS) such as Advil, Aleve, Ibuprofen, Motrin, Naproxen, Naprosyn and Aspirin based products such as Excedrin, Goodys Powder, BC Powder. (May take Tylenol or Acetaminophen if needed.)  NOW!  Stop ANY OVER THE COUNTER supplements until after surgery. (CO-Q 10) (May continue multivitamin.)  Wear comfortable clothing (specific to your surgery type) to the hospital.  Plan for stool softeners for home use.  If you are being discharged the day of surgery, you will not be allowed to drive home. You will need a responsible adult to drive you home and stay with you that night.   If you are taking public transportation, you will need to have a responsible adult with you. Please confirm with your physician that it is acceptable to use public transportation.   Please call [phone] if you have any questions about these instructions.

## 2018-04-12 MED ORDER — CEFAZOLIN SODIUM-DEXTROSE 2-4 GM/100ML-% IV SOLN
2.0000 g | INTRAVENOUS | Status: AC
  Administered 2018-04-13: 2 g via INTRAVENOUS

## 2018-04-13 ENCOUNTER — Ambulatory Visit: Payer: Medicare Other | Admitting: Anesthesiology

## 2018-04-13 ENCOUNTER — Other Ambulatory Visit: Payer: Self-pay

## 2018-04-13 ENCOUNTER — Encounter
Admission: RE | Admit: 2018-04-13 | Discharge: 2018-04-13 | Disposition: A | Discharge status: Home / Self Care | Payer: Medicare Other | Source: Ambulatory Visit | Attending: Surgery | Admitting: Surgery

## 2018-04-13 ENCOUNTER — Ambulatory Visit
Admission: RE | Admit: 2018-04-13 | Discharge: 2018-04-13 | Disposition: A | Discharge status: Home / Self Care | Payer: Medicare Other | Attending: Surgery | Admitting: Surgery

## 2018-04-13 ENCOUNTER — Encounter
Admission: RE | Disposition: A | Discharge status: Home / Self Care | Payer: Self-pay | Source: Home / Self Care | Attending: Surgery

## 2018-04-13 ENCOUNTER — Ambulatory Visit
Admission: RE | Admit: 2018-04-13 | Discharge: 2018-04-13 | Disposition: A | Discharge status: Home / Self Care | Payer: Medicare Other | Source: Home / Self Care | Attending: Surgery | Admitting: Surgery

## 2018-04-13 ENCOUNTER — Encounter: Payer: Self-pay | Admitting: Anesthesiology

## 2018-04-13 ENCOUNTER — Encounter: Payer: Self-pay | Admitting: *Deleted

## 2018-04-13 DIAGNOSIS — Z7984 Long term (current) use of oral hypoglycemic drugs: Secondary | ICD-10-CM | POA: Insufficient documentation

## 2018-04-13 DIAGNOSIS — K219 Gastro-esophageal reflux disease without esophagitis: Secondary | ICD-10-CM | POA: Insufficient documentation

## 2018-04-13 DIAGNOSIS — H401130 Primary open-angle glaucoma, bilateral, stage unspecified: Secondary | ICD-10-CM | POA: Diagnosis not present

## 2018-04-13 DIAGNOSIS — E119 Type 2 diabetes mellitus without complications: Secondary | ICD-10-CM | POA: Insufficient documentation

## 2018-04-13 DIAGNOSIS — Z79899 Other long term (current) drug therapy: Secondary | ICD-10-CM | POA: Diagnosis not present

## 2018-04-13 DIAGNOSIS — Z7982 Long term (current) use of aspirin: Secondary | ICD-10-CM | POA: Diagnosis not present

## 2018-04-13 DIAGNOSIS — C50312 Malignant neoplasm of lower-inner quadrant of left female breast: Secondary | ICD-10-CM

## 2018-04-13 DIAGNOSIS — Z170 Estrogen receptor positive status [ER+]: Secondary | ICD-10-CM | POA: Diagnosis not present

## 2018-04-13 DIAGNOSIS — C50912 Malignant neoplasm of unspecified site of left female breast: Secondary | ICD-10-CM

## 2018-04-13 DIAGNOSIS — E785 Hyperlipidemia, unspecified: Secondary | ICD-10-CM | POA: Insufficient documentation

## 2018-04-13 DIAGNOSIS — I10 Essential (primary) hypertension: Secondary | ICD-10-CM | POA: Insufficient documentation

## 2018-04-13 HISTORY — PX: PARTIAL MASTECTOMY WITH AXILLARY SENTINEL LYMPH NODE BIOPSY: SHX6004

## 2018-04-13 LAB — GLUCOSE, CAPILLARY
Glucose-Capillary: 158 mg/dL — ABNORMAL HIGH (ref 70–99)
Glucose-Capillary: 148 mg/dL — ABNORMAL HIGH (ref 70–99)

## 2018-04-13 SURGERY — PARTIAL MASTECTOMY WITH AXILLARY SENTINEL LYMPH NODE BIOPSY
Anesthesia: General | Site: Breast | Laterality: Left

## 2018-04-13 MED ORDER — LIDOCAINE HCL (CARDIAC) PF 100 MG/5ML IV SOSY: PREFILLED_SYRINGE | INTRAVENOUS | Status: DC | PRN

## 2018-04-13 MED ORDER — IBUPROFEN 800 MG PO TABS: 800.0000 mg | ORAL_TABLET | Freq: Three times a day (TID) | ORAL | 0 refills | Status: DC | PRN

## 2018-04-13 MED ORDER — ONDANSETRON HCL 4 MG/2ML IJ SOLN
INTRAMUSCULAR | Status: AC
  Filled 2018-04-13: qty 2

## 2018-04-13 MED ORDER — OXYCODONE HCL 5 MG PO TABS
ORAL_TABLET | ORAL | Status: AC
  Filled 2018-04-13: qty 1

## 2018-04-13 MED ORDER — FENTANYL CITRATE (PF) 100 MCG/2ML IJ SOLN
25.0000 ug | INTRAMUSCULAR | Status: DC | PRN
  Administered 2018-04-13 (×5): 25 ug via INTRAVENOUS

## 2018-04-13 MED ORDER — ACETAMINOPHEN 500 MG PO TABS
ORAL_TABLET | ORAL | Status: AC
  Filled 2018-04-13: qty 2

## 2018-04-13 MED ORDER — FENTANYL CITRATE (PF) 100 MCG/2ML IJ SOLN
INTRAMUSCULAR | Status: AC
  Filled 2018-04-13: qty 2

## 2018-04-13 MED ORDER — MIDAZOLAM HCL 2 MG/2ML IJ SOLN
INTRAMUSCULAR | Status: AC
  Filled 2018-04-13: qty 2

## 2018-04-13 MED ORDER — METHYLENE BLUE 0.5 % INJ SOLN
INTRAVENOUS | Status: AC
  Filled 2018-04-13: qty 10

## 2018-04-13 MED ORDER — SODIUM CHLORIDE 0.9 % IV SOLN
INTRAVENOUS | Status: DC
  Administered 2018-04-13: 10:00:00 via INTRAVENOUS

## 2018-04-13 MED ORDER — PROPOFOL 10 MG/ML IV BOLUS
INTRAVENOUS | Status: DC | PRN
  Administered 2018-04-13: 110 mg via INTRAVENOUS
  Administered 2018-04-13: 50 mg via INTRAVENOUS

## 2018-04-13 MED ORDER — PROPOFOL 10 MG/ML IV BOLUS: INTRAVENOUS | Status: DC | PRN

## 2018-04-13 MED ORDER — PROPOFOL 10 MG/ML IV BOLUS
INTRAVENOUS | Status: AC
  Filled 2018-04-13: qty 20

## 2018-04-13 MED ORDER — ONDANSETRON HCL 4 MG/2ML IJ SOLN
INTRAMUSCULAR | Status: DC | PRN
  Administered 2018-04-13: 4 mg via INTRAVENOUS

## 2018-04-13 MED ORDER — METHYLENE BLUE 0.5 % INJ SOLN
INTRAVENOUS | Status: DC | PRN
  Administered 2018-04-13: 7 mL

## 2018-04-13 MED ORDER — PROPOFOL 500 MG/50ML IV EMUL
INTRAVENOUS | Status: AC
  Filled 2018-04-13: qty 50

## 2018-04-13 MED ORDER — HYDROMORPHONE HCL 1 MG/ML IJ SOLN
INTRAMUSCULAR | Status: AC
  Filled 2018-04-13: qty 1

## 2018-04-13 MED ORDER — OXYCODONE HCL 5 MG PO TABS
5.0000 mg | ORAL_TABLET | Freq: Once | ORAL | Status: AC
  Administered 2018-04-13: 5 mg via ORAL

## 2018-04-13 MED ORDER — LIDOCAINE HCL 1 % IJ SOLN
INTRAMUSCULAR | Status: DC | PRN
  Administered 2018-04-13: 10 mL via SUBCUTANEOUS

## 2018-04-13 MED ORDER — CEFAZOLIN SODIUM-DEXTROSE 2-4 GM/100ML-% IV SOLN
INTRAVENOUS | Status: AC
  Filled 2018-04-13: qty 100

## 2018-04-13 MED ORDER — CHLORHEXIDINE GLUCONATE CLOTH 2 % EX PADS: 6.0000 | MEDICATED_PAD | Freq: Once | CUTANEOUS | Status: DC

## 2018-04-13 MED ORDER — BUPIVACAINE HCL (PF) 0.5 % IJ SOLN
INTRAMUSCULAR | Status: AC
  Filled 2018-04-13: qty 30

## 2018-04-13 MED ORDER — DEXAMETHASONE SODIUM PHOSPHATE 10 MG/ML IJ SOLN
INTRAMUSCULAR | Status: DC | PRN
  Administered 2018-04-13: 10 mg via INTRAVENOUS

## 2018-04-13 MED ORDER — HYDROCODONE-ACETAMINOPHEN 5-325 MG PO TABS: 1.0000 | ORAL_TABLET | Freq: Four times a day (QID) | ORAL | 0 refills | Status: AC | PRN

## 2018-04-13 MED ORDER — ACETAMINOPHEN 500 MG PO TABS
1000.0000 mg | ORAL_TABLET | ORAL | Status: AC
  Administered 2018-04-13: 1000 mg via ORAL

## 2018-04-13 MED ORDER — HYDROCODONE-ACETAMINOPHEN 5-325 MG PO TABS
ORAL_TABLET | ORAL | Status: AC
  Filled 2018-04-13: qty 1

## 2018-04-13 MED ORDER — TECHNETIUM TC 99M SULFUR COLLOID FILTERED
0.9030 | Freq: Once | INTRAVENOUS | Status: AC | PRN
  Administered 2018-04-13: 0.903 via INTRADERMAL

## 2018-04-13 MED ORDER — HYDROCODONE-ACETAMINOPHEN 5-325 MG PO TABS
1.0000 | ORAL_TABLET | Freq: Once | ORAL | Status: AC
  Administered 2018-04-13: 1 via ORAL

## 2018-04-13 MED ORDER — KETOROLAC TROMETHAMINE 30 MG/ML IJ SOLN
INTRAMUSCULAR | Status: DC | PRN
  Administered 2018-04-13: 30 mg via INTRAVENOUS

## 2018-04-13 MED ORDER — LIDOCAINE HCL (CARDIAC) PF 100 MG/5ML IV SOSY
PREFILLED_SYRINGE | INTRAVENOUS | Status: DC | PRN
  Administered 2018-04-13: 100 mg via INTRAVENOUS

## 2018-04-13 MED ORDER — MIDAZOLAM HCL 2 MG/2ML IJ SOLN
INTRAMUSCULAR | Status: DC | PRN
  Administered 2018-04-13: 2 mg via INTRAVENOUS

## 2018-04-13 MED ORDER — PROPOFOL 500 MG/50ML IV EMUL
INTRAVENOUS | Status: DC | PRN
  Administered 2018-04-13: 150 ug/kg/min via INTRAVENOUS

## 2018-04-13 MED ORDER — FENTANYL CITRATE (PF) 100 MCG/2ML IJ SOLN
INTRAMUSCULAR | Status: DC | PRN
  Administered 2018-04-13 (×2): 25 ug via INTRAVENOUS
  Administered 2018-04-13: 50 ug via INTRAVENOUS

## 2018-04-13 MED ORDER — ONDANSETRON HCL 4 MG/2ML IJ SOLN: 4.0000 mg | Freq: Once | INTRAMUSCULAR | Status: DC | PRN

## 2018-04-13 MED ORDER — LIDOCAINE HCL (PF) 1 % IJ SOLN
INTRAMUSCULAR | Status: AC
  Filled 2018-04-13: qty 30

## 2018-04-13 MED ORDER — DEXMEDETOMIDINE HCL 200 MCG/2ML IV SOLN
INTRAVENOUS | Status: DC | PRN
  Administered 2018-04-13 (×2): 8 ug via INTRAVENOUS

## 2018-04-13 MED ORDER — DOCUSATE SODIUM 100 MG PO CAPS: 100.0000 mg | ORAL_CAPSULE | Freq: Two times a day (BID) | ORAL | 0 refills | Status: AC | PRN

## 2018-04-13 MED ORDER — FENTANYL CITRATE (PF) 100 MCG/2ML IJ SOLN
INTRAMUSCULAR | Status: AC
  Administered 2018-04-13: 25 ug via INTRAVENOUS
  Filled 2018-04-13: qty 2

## 2018-04-13 SURGICAL SUPPLY — 46 items
APPLIER CLIP 11 MED OPEN (CLIP)
BLADE SURG 15 STRL LF DISP TIS (BLADE) ×1 IMPLANT
BLADE SURG 15 STRL SS (BLADE) ×1
CANISTER SUCT 1200ML W/VALVE (MISCELLANEOUS) ×2 IMPLANT
CHLORAPREP W/TINT 26ML (MISCELLANEOUS) ×2 IMPLANT
CLIP APPLIE 11 MED OPEN (CLIP) IMPLANT
CNTNR SPEC 2.5X3XGRAD LEK (MISCELLANEOUS) ×1
CONT SPEC 4OZ STER OR WHT (MISCELLANEOUS) ×1
CONTAINER SPEC 2.5X3XGRAD LEK (MISCELLANEOUS) ×1 IMPLANT
COVER WAND RF STERILE (DRAPES) ×2 IMPLANT
DERMABOND ADVANCED (GAUZE/BANDAGES/DRESSINGS) ×1
DERMABOND ADVANCED .7 DNX12 (GAUZE/BANDAGES/DRESSINGS) ×1 IMPLANT
DEVICE DUBIN SPECIMEN MAMMOGRA (MISCELLANEOUS) ×2 IMPLANT
DRAPE LAPAROTOMY TRNSV 106X77 (MISCELLANEOUS) ×2 IMPLANT
DRAPE SHEET LG 3/4 BI-LAMINATE (DRAPES) ×2 IMPLANT
ELECT CAUTERY BLADE TIP 2.5 (TIP) ×2
ELECT CAUTERY NEEDLE 2.0 MIC (NEEDLE) ×2 IMPLANT
ELECT REM PT RETURN 9FT ADLT (ELECTROSURGICAL) ×2
ELECTRODE CAUTERY BLDE TIP 2.5 (TIP) ×1 IMPLANT
ELECTRODE REM PT RTRN 9FT ADLT (ELECTROSURGICAL) ×1 IMPLANT
GAUZE 4X4 16PLY RFD (DISPOSABLE) ×2 IMPLANT
GAUZE SPONGE 4X4 12PLY STRL (GAUZE/BANDAGES/DRESSINGS) ×2 IMPLANT
GLOVE BIOGEL PI IND STRL 7.0 (GLOVE) ×1 IMPLANT
GLOVE BIOGEL PI INDICATOR 7.0 (GLOVE) ×1
GLOVE SURG SYN 7.0 (GLOVE) ×4 IMPLANT
GOWN STRL REUS W/ TWL LRG LVL3 (GOWN DISPOSABLE) ×3 IMPLANT
GOWN STRL REUS W/TWL LRG LVL3 (GOWN DISPOSABLE) ×3
JACKSON PRATT 10 (INSTRUMENTS) IMPLANT
KIT TURNOVER KIT A (KITS) ×2 IMPLANT
LABEL OR SOLS (LABEL) ×2 IMPLANT
LIGHT WAVEGUIDE WIDE FLAT (MISCELLANEOUS) ×2 IMPLANT
NEEDLE HYPO 25X1 1.5 SAFETY (NEEDLE) ×4 IMPLANT
PACK BASIN MINOR ARMC (MISCELLANEOUS) ×2 IMPLANT
SLEVE PROBE SENORX GAMMA FIND (MISCELLANEOUS) ×2 IMPLANT
SUT MNCRL 4-0 (SUTURE) ×2
SUT MNCRL 4-0 27XMFL (SUTURE) ×2
SUT SILK 2 0 (SUTURE)
SUT SILK 2 0 SH (SUTURE) ×2 IMPLANT
SUT SILK 2-0 30XBRD TIE 12 (SUTURE) IMPLANT
SUT SILK 3 0 12 30 (SUTURE) IMPLANT
SUT VIC AB 3-0 SH 27 (SUTURE) ×2
SUT VIC AB 3-0 SH 27X BRD (SUTURE) ×2 IMPLANT
SUTURE MNCRL 4-0 27XMF (SUTURE) ×2 IMPLANT
SYR 10ML LL (SYRINGE) ×2 IMPLANT
SYR 50ML LL SCALE MARK (SYRINGE) IMPLANT
WATER STERILE IRR 1000ML POUR (IV SOLUTION) ×2 IMPLANT

## 2018-04-13 NOTE — Transfer of Care (Signed)
Immediate Anesthesia Transfer of Care Note  Patient: Jackie Allison  Procedure(s) Performed: PARTIAL MASTECTOMY WITH AXILLARY SENTINEL LYMPH NODE BIOPSY  (No Needle Loc) (Left Breast)  Patient Location: PACU  Anesthesia Type:General  Level of Consciousness: sedated  Airway & Oxygen Therapy: Patient Spontanous Breathing and Patient connected to face mask oxygen  Post-op Assessment: Report given to RN and Post -op Vital signs reviewed and stable  Post vital signs: Reviewed and stable  Last Vitals:  Vitals Value Taken Time  BP 166/75 04/13/2018  1:20 PM  Temp    Pulse 61 04/13/2018  1:26 PM  Resp 13 04/13/2018  1:26 PM  SpO2 100 % 04/13/2018  1:26 PM  Vitals shown include unvalidated device data.  Last Pain:  Vitals:   04/13/18 1306  TempSrc:   PainSc: 5          Complications: No apparent anesthesia complications

## 2018-04-13 NOTE — Anesthesia Preprocedure Evaluation (Signed)
Anesthesia Evaluation  Patient identified by MRN, date of birth, ID band Patient awake    Reviewed: Allergy & Precautions, H&P , NPO status , Patient's Chart, lab work & pertinent test results, reviewed documented beta blocker date and time   History of Anesthesia Complications (+) PONV and history of anesthetic complications  Airway Mallampati: I  TM Distance: >3 FB Neck ROM: full    Dental  (+) Implants, Caps, Dental Advidsory Given, Teeth Intact   Pulmonary neg pulmonary ROS,           Cardiovascular Exercise Tolerance: Good hypertension, (-) angina(-) CAD, (-) Past MI, (-) Cardiac Stents and (-) CABG (-) dysrhythmias (-) Valvular Problems/Murmurs     Neuro/Psych negative neurological ROS  negative psych ROS   GI/Hepatic Neg liver ROS, GERD  ,  Endo/Other  diabetes  Renal/GU negative Renal ROS  negative genitourinary   Musculoskeletal   Abdominal   Peds  Hematology  (+) Blood dyscrasia, anemia ,   Anesthesia Other Findings Past Medical History: No date: Anemia     Comment:  chronic microcytic anemia 2019: Cancer ([place])     Comment:  left breast No date: Diabetes mellitus without complication (HCC) No date: GERD (gastroesophageal reflux disease) No date: Glaucoma No date: Hypercholesterolemia No date: Hypertension   Reproductive/Obstetrics negative OB ROS                             Anesthesia Physical Anesthesia Plan  ASA: III  Anesthesia Plan: General   Post-op Pain Management:    Induction: Intravenous  PONV Risk Score and Plan: 4 or greater and Ondansetron, Dexamethasone, Propofol infusion, TIVA and Treatment may vary due to age or medical condition  Airway Management Planned: LMA  Additional Equipment:   Intra-op Plan:   Post-operative Plan: Extubation in OR  Informed Consent: I have reviewed the patients History and Physical, chart, labs and discussed the  procedure including the risks, benefits and alternatives for the proposed anesthesia with the patient or authorized representative who has indicated his/her understanding and acceptance.   Dental Advisory Given  Plan Discussed with: Anesthesiologist, CRNA and Surgeon  Anesthesia Plan Comments:         Anesthesia Quick Evaluation

## 2018-04-13 NOTE — Anesthesia Post-op Follow-up Note (Signed)
Anesthesia QCDR form completed.        

## 2018-04-13 NOTE — Op Note (Signed)
Preoperative diagnosis:  left breast carcinoma.  Postoperative diagnosis: same.   Procedure: needle-localized left  breast partial mastectomy.                      left  Axillary Sentinel Lymph node biopsy  Anesthesia: GETA  Surgeon: Dr. Benjamine Sprague  Wound Classification: Clean  Indications: Patient is a 77 y.o. female with a palpable left  breast mass noted on mammography with core biopsy demonstrating breast CA requires needle-localized partial mastectomy for treatment with sentinel lymph node biopsy.   Specimen: left  Breast mass, Sentinel Lymph nodes x 2, superior-lateral margin, anterior inferior margin  Complications: None  Estimated Blood Loss: 63mL  Findings: 1. Specimen mammography shows marker and wire on specimen 2. Pathology call refers gross examination of margins was positive, additional margins resected. 3. No other palpable mass or lymph node identified.   Description of procedure: In the nuclear medicine suite, the subareolar region was injected with Tc-99 sulfur colloid. The patient was taken to the operating room and placed supine on the operating table, and after general anesthesia the left breast and axilla were prepped and draped in the usual sterile fashion.  A time-out was completed verifying correct patient, procedure, site, positioning, and implant(s) and/or special equipment prior to beginning this procedure.  A skin incision was planned in such a way as to minimize the amount of dissection to reach the palpable mass.  The skin incision was made after infusion of local. Flaps were raised and the location of the wire confirmed. The wire was delivered into the wound. Sharp and blunt dissection was then taken down to the mass in adherent to fascia, measuring 3cm x 3cm x 3cm, taking care to include a margin of grossly normal tissue. The specimen was removed. The specimen was oriented with long lateral, short superior, deep double sutures and sent to radiology with  the localization studies. Confirmation was received that the biopsy clip was within specimen but the anterior-superior was positive for cancer and the inferior margin was close.  These borders were additionally resected and sent to pathology with identical tags.  The anterior-inferior border had to include the skin since there was only minimal tissue noted deep to to it that would not have been able to be removed without removing the overlying skin.    A hand-held gamma probe was used to attempt to identify the location of the hottest spot in the axilla. This did not initially yield any obvious hot spot.  Blue dye injected and  An incision was made around the caudal axillary hairline where there was a slight bump in the count. Sharp and blunt Dissection was carried down to subdermal facias. The probe was placed within wound and again, the point of maximal count was eventually found. Dissection continue until nodule was identified. The probe was placed in contact with the node and 130 counts were recorded. The node was excised in its entirety. Ex vivo, the node measured 135 counts when placed on the probe. The bed of the node measured 20 counts. No additional hot spot was detected, but a similar size, palpable node found adjacent to sentinel lymph node and this was excised in similar fashion. No additional hot spots were identified. No additional clinically abnormal nodes were palpated. Both wounds irrigated, hemostasis was achieved and the wound closed in layers with  interrupted sutures of 3-0 Vicryl in deep dermal layer and a running subcuticular suture of Monocryl 4-0, then dressed with  dermabond. The patient tolerated the procedure well and was taken to the postanesthesia care unit in stable condition. Sponge and instrument count correct at end of procedure.

## 2018-04-13 NOTE — Discharge Instructions (Signed)
Breast Biopsy, Care After These instructions give you information about caring for yourself after your procedure. Your doctor may also give you more specific instructions. Call your doctor if you have any problems or questions after your procedure. Follow these instructions at home: Medicines  tylenol and advil as needed for discomfort.  Please alternate between the two every four hours as needed for pain.    Use narcotics, if prescribed, only when tylenol and motrin is not enough to control pain.  325-650mg  every 8hrs to max of 4000mg /24hrs (including the 325mg  in every norco dose) for the tylenol.    Advil up to 800mg  per dose every 8hrs as needed for pain.    Do not drive for 24 hours if you received a sedative.  Do not drink alcohol while taking pain medicine.  Do not drive or use heavy machinery while taking prescription pain medicine. Biopsy Site Care   Follow instructions from your doctor about how to take care of your cut from surgery (incision) or puncture area. Make sure you: ? OK TO SHOWER IN 24HRS ? Leave any stitches (sutures), skin glue, or skin tape (adhesive) strips in place. They may need to stay in place for 2 weeks or longer. If tape strips get loose and curl up, you may trim the loose edges. Do not remove tape strips completely unless your doctor says it is okay.  If you have stitches, keep them dry when you take a bath or a shower.  Check your cut or puncture area every day for signs of infection. Check for: ? More redness, swelling, or pain. ? More fluid or blood. ? Warmth. ? Pus or a bad smell.  Protect the biopsy area. Do not let the area get bumped. Activity  Avoid activities that could pull the biopsy site open. ? Avoid stretching. ? Avoid reaching. ? Avoid exercise. ? Avoid sports. ? Avoid lifting anything that is heavier than 3 pounds (1.4 kg).  Return to your normal activities as told by your doctor. Ask your doctor what activities are safe for  you. General instructions  Continue your normal diet.  Wear a good support bra for as long as told by your doctor.  Get checked for extra fluid in your body (lymphedema) as often as told by your doctor.  Keep all follow-up visits as told by your doctor. This is important. Contact a health care provider if:  You have more redness, swelling, or pain at the biopsy site.  You have more fluid or blood coming from your biopsy site.  Your biopsy site feels warm to the touch.  You have pus or a bad smell coming from the biopsy site.  Your biopsy site breaks open after the stitches, staples, or skin tape strips have been removed.  You have a rash.  You have a fever. Get help right away if:  You have more bleeding (more than a small spot) from the biopsy site.  You have trouble breathing.  You have red streaks around the biopsy site. This information is not intended to replace advice given to you by your health care provider. Make sure you discuss any questions you have with your health care provider.  AMBULATORY SURGERY  DISCHARGE INSTRUCTIONS   1) The drugs that you were given will stay in your system until tomorrow so for the next 24 hours you should not:  A) Drive an automobile B) Make any legal decisions C) Drink any alcoholic beverage   2) You may resume  regular meals tomorrow.  Today it is better to start with liquids and gradually work up to solid foods.  You may eat anything you prefer, but it is better to start with liquids, then soup and crackers, and gradually work up to solid foods.   3) Please notify your doctor immediately if you have any unusual bleeding, trouble breathing, redness and pain at the surgery site, drainage, fever, or pain not relieved by medication.    4) Additional Instructions:        Please contact your physician with any problems or Same Day Surgery at [phone], Monday through Friday 6 am to 4 pm, or [place] [facility] number at [phone].

## 2018-04-13 NOTE — Anesthesia Procedure Notes (Signed)
Procedure Name: LMA Insertion Date/Time: 04/13/2018 10:32 AM Performed by: Justus Memory, CRNA Pre-anesthesia Checklist: Patient identified, Patient being monitored, Timeout performed, Emergency Drugs available and Suction available Patient Re-evaluated:Patient Re-evaluated prior to induction Oxygen Delivery Method: Circle system utilized Preoxygenation: Pre-oxygenation with 100% oxygen Induction Type: IV induction Ventilation: Mask ventilation without difficulty LMA: LMA inserted LMA Size: 3.5 Tube type: Oral Number of attempts: 1 Placement Confirmation: positive ETCO2 and breath sounds checked- equal and bilateral Tube secured with: Tape Dental Injury: Teeth and Oropharynx as per pre-operative assessment

## 2018-04-13 NOTE — Interval H&P Note (Signed)
History and Physical Interval Note:  04/13/2018 8:37 AM  Jackie Allison  has presented today for surgery, with the diagnosis of left breast cancer  The various methods of treatment have been discussed with the patient and family. After consideration of risks, benefits and other options for treatment, the patient has consented to  Procedure(s): PARTIAL MASTECTOMY WITH AXILLARY SENTINEL LYMPH NODE BIOPSY  (No Needle Loc) (Left) as a surgical intervention .  The patient's history has been reviewed, patient examined, no change in status, stable for surgery.  I have reviewed the patient's chart and labs.  Questions were answered to the patient's satisfaction.  MRI noted no chest wall involvement.  Will proceed as scheduled.   Andrews Tener Lysle Pearl

## 2018-04-14 ENCOUNTER — Encounter: Payer: Self-pay | Admitting: Surgery

## 2018-04-14 ENCOUNTER — Ambulatory Visit: Payer: Medicare Other

## 2018-04-14 NOTE — Anesthesia Postprocedure Evaluation (Signed)
Anesthesia Post Note  Patient: [name]  Procedure(s) Performed: PARTIAL MASTECTOMY WITH AXILLARY SENTINEL LYMPH NODE BIOPSY  (No Needle Loc) (Left Breast)  Patient location during evaluation: PACU Anesthesia Type: General Level of consciousness: awake and alert Pain management: pain level controlled Vital Signs Assessment: post-procedure vital signs reviewed and stable Respiratory status: spontaneous breathing, nonlabored ventilation, respiratory function stable and patient connected to nasal cannula oxygen Cardiovascular status: blood pressure returned to baseline and stable Postop Assessment: no apparent nausea or vomiting Anesthetic complications: no     Last Vitals:  Vitals:   04/13/18 1415 04/13/18 1609  BP: (!) 168/67 (!) 138/57  Pulse: 64 69  Resp: 14 16  Temp: (!) 36.1 C   SpO2: 97% 100%    Last Pain:  Vitals:   04/14/18 0817  TempSrc:   PainSc: 1                  Martha Clan

## 2018-04-19 LAB — SURGICAL PATHOLOGY

## 2018-04-22 ENCOUNTER — Other Ambulatory Visit: Payer: Self-pay

## 2018-04-22 ENCOUNTER — Emergency Department
Admission: EM | Admit: 2018-04-22 | Discharge: 2018-04-22 | Disposition: A | Discharge status: Home / Self Care | Payer: Medicare Other | Attending: Emergency Medicine | Admitting: Emergency Medicine

## 2018-04-22 DIAGNOSIS — Z79899 Other long term (current) drug therapy: Secondary | ICD-10-CM | POA: Diagnosis not present

## 2018-04-22 DIAGNOSIS — Y92013 Bedroom of single-family (private) house as the place of occurrence of the external cause: Secondary | ICD-10-CM | POA: Insufficient documentation

## 2018-04-22 DIAGNOSIS — Z853 Personal history of malignant neoplasm of breast: Secondary | ICD-10-CM | POA: Diagnosis not present

## 2018-04-22 DIAGNOSIS — Z5189 Encounter for other specified aftercare: Secondary | ICD-10-CM

## 2018-04-22 DIAGNOSIS — Y9384 Activity, sleeping: Secondary | ICD-10-CM | POA: Insufficient documentation

## 2018-04-22 DIAGNOSIS — I10 Essential (primary) hypertension: Secondary | ICD-10-CM | POA: Diagnosis not present

## 2018-04-22 DIAGNOSIS — T819XXA Unspecified complication of procedure, initial encounter: Secondary | ICD-10-CM | POA: Diagnosis present

## 2018-04-22 DIAGNOSIS — T148XXA Other injury of unspecified body region, initial encounter: Secondary | ICD-10-CM

## 2018-04-22 DIAGNOSIS — Y658 Other specified misadventures during surgical and medical care: Secondary | ICD-10-CM | POA: Diagnosis not present

## 2018-04-22 DIAGNOSIS — Y998 Other external cause status: Secondary | ICD-10-CM | POA: Insufficient documentation

## 2018-04-22 NOTE — ED Triage Notes (Signed)
Pt states she had a lumpectomy with axillary lympnodes removed on 12/17, states in the middle of the night she woke with blood on her gawn and more tenderness/pain to that left breast, noted area still bleeding from under the breast at one of the incisions. Clean bandage applied, bleeding controlled at this time. States she has not had any issues post surgery until today.

## 2018-04-22 NOTE — ED Triage Notes (Signed)
Had breast surgery last week.  Last night it started bleeding from incision line and has had to change dressing,.

## 2018-04-22 NOTE — ED Provider Notes (Signed)
[facility] Emergency Department Provider Note   ____________________________________________    I have reviewed the triage vital signs and the nursing notes.   HISTORY  Chief Complaint Post-op Problem     HPI Jackie Allison is a 77 y.o. female who is status post lumpectomy on December 17 who has been healing well but reports this morning she woke up with "a lot of blood on her night shirt ".  She denies pain or redness or dizziness.  Apparently called surgery office and they recommended she come to the emergency department.  Denies fevers or chills.  Surgeon is Dr. Lysle Pearl  Past Medical History:  Diagnosis Date  . Anemia    chronic microcytic anemia  . Cancer ([place]) 2019   left breast  . Diabetes mellitus without complication ([place])   . GERD (gastroesophageal reflux disease)   . Glaucoma   . Hypercholesterolemia   . Hypertension     Patient Active Problem List   Diagnosis Date Noted  . Lymphedema 02/14/2018  . 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 Hyperlipidemia 02/11/2018  . Diabetes ([place]) 02/11/2018    Past Surgical History:  Procedure Laterality Date  . BACK SURGERY    . BREAST BIOPSY Right    stereo- neg  . BREAST BIOPSY Left 02/2018  . BREAST EXCISIONAL BIOPSY Left 04/03/2018   left breast cancer lumpectomy   . CATARACT EXTRACTION W/ INTRAOCULAR LENS  IMPLANT, BILATERAL    . EYE SURGERY Bilateral    cataract extraction  . LUMBAR FUSION  2002?  . LUMBAR LAMINECTOMY  1997   titanium in back  . PARTIAL MASTECTOMY WITH AXILLARY SENTINEL LYMPH NODE BIOPSY Left 04/13/2018   Procedure: PARTIAL MASTECTOMY WITH AXILLARY SENTINEL LYMPH NODE BIOPSY  (No Needle Loc);  Surgeon: Benjamine Sprague, DO;  Location: [facility] ORS;  Service: General;  Laterality: Left;  . RETINAL DETACHMENT SURGERY Bilateral 2009  . TUBAL LIGATION      Prior to Admission medications   Medication Sig Start Date End Date Taking? Authorizing Provider  acetaminophen  (TYLENOL) 500 MG tablet Take 500 mg by mouth every 6 (six) hours as needed for moderate pain.  11/04/16   [provider]  aspirin 81 MG EC tablet Take 81 mg by mouth daily.     [provider]  atorvastatin (LIPITOR) 10 MG tablet Take 10 mg by mouth daily.  10/16/16   [provider]  benazepril (LOTENSIN) 40 MG tablet Take 40 mg by mouth daily.  09/11/17 09/11/18  [provider]  brimonidine (ALPHAGAN) 0.2 % ophthalmic solution Place 1 drop into the right eye 2 (two) times daily.     [provider]  carvedilol (COREG) 12.5 MG tablet Take 12.5 mg by mouth 2 (two) times daily with a meal.     [provider]  chlorhexidine (PERIDEX) 0.12 % solution Use as directed 15 mLs in the mouth or throat 2 (two) times daily.  09/07/17   [provider]  Coenzyme Q10 (COQ10 PO) Take 1 tablet by mouth daily.    [provider]  docusate sodium (COLACE) 100 MG capsule Take 1 capsule (100 mg total) by mouth 2 (two) times daily as needed for up to 10 days for mild constipation. 04/13/18 04/23/18  Lysle Pearl, Isami, DO  dorzolamide-timolol (COSOPT) 22.3-6.8 MG/ML ophthalmic solution Place 1 drop into both eyes 2 (two) times daily. 03/12/18   [provider]  hydrALAZINE (APRESOLINE) 100 MG tablet Take 100 mg by mouth daily.  02/04/18  [provider]  ibuprofen (ADVIL,MOTRIN) 800 MG tablet Take 1 tablet (800 mg total) by mouth every 8 (eight) hours as needed for mild pain or moderate pain. 04/13/18   Lysle Pearl, Isami, DO  latanoprost (XALATAN) 0.005 % ophthalmic solution Place 1 drop into both eyes at bedtime.  01/30/17   [provider]  metFORMIN (GLUCOPHAGE) 500 MG tablet Take 500 mg by mouth 2 (two) times daily with a meal.  10/23/16   [provider]  Multiple Vitamin (MULTI-VITAMINS) TABS Take 1 tablet by mouth daily.     [provider]  omeprazole (PRILOSEC) 40 MG capsule Take 40 mg by mouth daily.  12/02/16    [provider]  Specialty Vitamins Products (MAGNESIUM, AMINO ACID CHELATE,) 133 MG tablet Take 1 tablet by mouth daily.    [provider]  trolamine salicylate (ASPERCREME) 10 % cream Apply 1 application topically as needed for muscle pain.    [provider]     Allergies Shellfish allergy; Iodine; and Sulfa antibiotics  Family History  Problem Relation Age of Onset  . Diabetes Sister   . Diabetes Brother   . Diabetes Sister   . Breast cancer Other   . Hypertension Mother   . Glaucoma Mother   . Heart attack Father     Social History Social History   Tobacco Use  . Smoking status: Never Smoker  . Smokeless tobacco: Never Used  Substance Use Topics  . Alcohol use: Never    Frequency: Never  . Drug use: Never    Review of Systems  Constitutional: As above Eyes: No visual changes.  ENT: No sore throat. Cardiovascular: Denies chest pain. Respiratory: Denies shortness of breath. Gastrointestinal: No abdominal pain.   Genitourinary: Negative for dysuria. Musculoskeletal: Negative for back pain. Skin: As above Neurological: Negative for headaches    ____________________________________________   PHYSICAL EXAM:  VITAL SIGNS: ED Triage Vitals  Enc Vitals Group     BP 04/22/18 1419 (!) 156/72     Pulse Rate 04/22/18 1419 69     Resp 04/22/18 1419 17     Temp 04/22/18 1419 98.1 F (36.7 C)     Temp Source 04/22/18 1419 Oral     SpO2 04/22/18 1419 99 %     Weight 04/22/18 1420 73.5 kg (162 lb)     Height 04/22/18 1420 1.676 m (5\' 6" )     Head Circumference --      Peak Flow --      Pain Score 04/22/18 1420 3     Pain Loc --      Pain Edu? --      Excl. in [place]? --     Constitutional: Alert and oriented. No acute distress.   Nose: No congestion/rhinnorhea.  Cardiovascular: Normal rate, regular rhythm. Good peripheral circulation. Respiratory: Normal respiratory effort.  No retractions.  Gastrointestinal: Soft and nontender.  No distention.    Musculoskeletal: No lower extremity tenderness nor edema.  Warm and well perfused Neurologic:  Normal speech and language. No gross focal neurologic deficits are appreciated.  Skin:  Skin is warm, dry and intact.  Beneath left breast, incision site clean dry and appears intact, no surrounding erythema or tenderness to suggest infection, no active bleeding Psychiatric: Mood and affect are normal. Speech and behavior are normal.  ____________________________________________   LABS (all labs ordered are listed, but only abnormal results are displayed)  Labs Reviewed - No data to display ____________________________________________  EKG  None ____________________________________________  RADIOLOGY  None ____________________________________________   PROCEDURES  Procedure(s) performed: No  Procedures   Critical Care performed: No ____________________________________________   INITIAL IMPRESSION / ASSESSMENT AND PLAN / ED COURSE  Pertinent labs & imaging results that were available during my care of the patient were reviewed by me and considered in my medical decision making (see chart for details).  Based on exam and her description of bleeding suspect postoperative hematoma.  She is afebrile with reassuring exam.  Recommend follow-up with surgeon as an outpatient    ____________________________________________   FINAL CLINICAL IMPRESSION(S) / ED DIAGNOSES  Final diagnoses:  Hematoma  Visit for wound check        Note:  This document was prepared using Dragon voice recognition software and may include unintentional dictation errors.    Lavonia Drafts, MD 04/22/18 [phone]

## 2018-04-22 NOTE — ED Notes (Addendum)
Patient presents c/o bleeding to lumpectomy site, bandage replaced in triage, denies any pain.

## 2018-04-23 NOTE — Addendum Note (Signed)
Encounter addended by: Nona Dell, RT on: 04/23/2018 4:08 PM  Actions taken: Imaging Exam begun

## 2018-04-24 ENCOUNTER — Other Ambulatory Visit: Payer: Self-pay

## 2018-04-24 ENCOUNTER — Encounter: Payer: Self-pay | Admitting: Emergency Medicine

## 2018-04-24 ENCOUNTER — Emergency Department: Payer: Medicare Other

## 2018-04-24 ENCOUNTER — Emergency Department
Admission: EM | Admit: 2018-04-24 | Discharge: 2018-04-25 | Disposition: A | Discharge status: Home / Self Care | Payer: Medicare Other | Attending: Emergency Medicine | Admitting: Emergency Medicine

## 2018-04-24 DIAGNOSIS — I10 Essential (primary) hypertension: Secondary | ICD-10-CM | POA: Diagnosis not present

## 2018-04-24 DIAGNOSIS — Z7982 Long term (current) use of aspirin: Secondary | ICD-10-CM | POA: Diagnosis not present

## 2018-04-24 DIAGNOSIS — Z853 Personal history of malignant neoplasm of breast: Secondary | ICD-10-CM | POA: Diagnosis not present

## 2018-04-24 DIAGNOSIS — Z7984 Long term (current) use of oral hypoglycemic drugs: Secondary | ICD-10-CM | POA: Insufficient documentation

## 2018-04-24 DIAGNOSIS — N6489 Other specified disorders of breast: Secondary | ICD-10-CM | POA: Insufficient documentation

## 2018-04-24 DIAGNOSIS — E119 Type 2 diabetes mellitus without complications: Secondary | ICD-10-CM | POA: Diagnosis not present

## 2018-04-24 DIAGNOSIS — Z79899 Other long term (current) drug therapy: Secondary | ICD-10-CM | POA: Insufficient documentation

## 2018-04-24 DIAGNOSIS — L7682 Other postprocedural complications of skin and subcutaneous tissue: Secondary | ICD-10-CM | POA: Diagnosis present

## 2018-04-24 LAB — CBC WITH DIFFERENTIAL/PLATELET
Abs Immature Granulocytes: 0.02 10*3/uL (ref 0.00–0.07)
BASOS ABS: 0 10*3/uL (ref 0.0–0.1)
Basophils Relative: 0 %
EOS ABS: 0.2 10*3/uL (ref 0.0–0.5)
Eosinophils Relative: 2 %
HCT: 31.1 % — ABNORMAL LOW (ref 36.0–46.0)
Hemoglobin: 10.1 g/dL — ABNORMAL LOW (ref 12.0–15.0)
Immature Granulocytes: 0 %
Lymphocytes Relative: 10 %
Lymphs Abs: 0.9 10*3/uL (ref 0.7–4.0)
MCH: 23.7 pg — ABNORMAL LOW (ref 26.0–34.0)
MCHC: 32.5 g/dL (ref 30.0–36.0)
MCV: 72.8 fL — ABNORMAL LOW (ref 80.0–100.0)
Monocytes Absolute: 1.2 10*3/uL — ABNORMAL HIGH (ref 0.1–1.0)
Monocytes Relative: 13 %
NEUTROS PCT: 75 %
Neutro Abs: 6.8 10*3/uL (ref 1.7–7.7)
Platelets: 300 10*3/uL (ref 150–400)
RBC: 4.27 MIL/uL (ref 3.87–5.11)
RDW: 14.7 % (ref 11.5–15.5)
WBC: 9 10*3/uL (ref 4.0–10.5)
nRBC: 0 % (ref 0.0–0.2)

## 2018-04-24 LAB — COMPREHENSIVE METABOLIC PANEL
ALT: 14 U/L (ref 0–44)
AST: 13 U/L — ABNORMAL LOW (ref 15–41)
Albumin: 3.9 g/dL (ref 3.5–5.0)
Alkaline Phosphatase: 52 U/L (ref 38–126)
Anion gap: 7 (ref 5–15)
BUN: 18 mg/dL (ref 8–23)
CO2: 24 mmol/L (ref 22–32)
Calcium: 9.3 mg/dL (ref 8.9–10.3)
Chloride: 97 mmol/L — ABNORMAL LOW (ref 98–111)
Creatinine, Ser: 0.8 mg/dL (ref 0.44–1.00)
GFR calc Af Amer: >60 mL/min (ref >60)
Glucose, Bld: 144 mg/dL — ABNORMAL HIGH (ref 70–99)
Potassium: 3.3 mmol/L — ABNORMAL LOW (ref 3.5–5.1)
Sodium: 128 mmol/L — ABNORMAL LOW (ref 135–145)
Total Bilirubin: 0.6 mg/dL (ref 0.3–1.2)
Total Protein: 7.4 g/dL (ref 6.5–8.1)

## 2018-04-24 LAB — CG4 I-STAT (LACTIC ACID): Lactic Acid, Venous: 1.03 mmol/L (ref 0.5–1.9)

## 2018-04-24 LAB — PROTIME-INR
INR: 0.99
Prothrombin Time: 13 seconds (ref 11.4–15.2)

## 2018-04-24 NOTE — ED Provider Notes (Signed)
[facility] Emergency Department Provider Note   ____________________________________________   First MD Initiated Contact with Patient 04/24/18 2312     (approximate)  I have reviewed the triage vital signs and the nursing notes.   HISTORY  Chief Complaint Post-op Problem    HPI Jackie Allison is a 77 y.o. female who presents to the ED from home with a chief complaint of saturated dressing.  Patient had needle localized left breast partial mastectomy and left axillary sentinel lymph node biopsy on 04/13/2018 by Dr. Lysle Pearl.  She came to the ED on 12/26 for bloody drainage.  Thought to have hematoma and discharged home.  Returns this evening because she had a large amount of drainage to her clothing.  Denies fever, chills, increased pain, shortness of breath, abdominal pain, nausea or vomiting.  Follow-up appointment is in 2 days.   Past Medical History:  Diagnosis Date  . Anemia    chronic microcytic anemia  . Cancer ([place]) 2019   left breast  . Diabetes mellitus without complication ([place])   . GERD (gastroesophageal reflux disease)   . Glaucoma   . Hypercholesterolemia   . Hypertension     Patient Active Problem List   Diagnosis Date Noted  . Lymphedema 02/14/2018  . 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 Hyperlipidemia 02/11/2018  . Diabetes ([place]) 02/11/2018    Past Surgical History:  Procedure Laterality Date  . BACK SURGERY    . BREAST BIOPSY Right    stereo- neg  . BREAST BIOPSY Left 02/2018  . BREAST EXCISIONAL BIOPSY Left 04/03/2018   left breast cancer lumpectomy   . BREAST LUMPECTOMY Left 04/03/2018  . CATARACT EXTRACTION W/ INTRAOCULAR LENS  IMPLANT, BILATERAL    . EYE SURGERY Bilateral    cataract extraction  . LUMBAR FUSION  2002?  . LUMBAR LAMINECTOMY  1997   titanium in back  . PARTIAL MASTECTOMY WITH AXILLARY SENTINEL LYMPH NODE BIOPSY Left 04/13/2018   Procedure: PARTIAL MASTECTOMY WITH AXILLARY SENTINEL LYMPH NODE BIOPSY   (No Needle Loc);  Surgeon: Benjamine Sprague, DO;  Location: [facility] ORS;  Service: General;  Laterality: Left;  . RETINAL DETACHMENT SURGERY Bilateral 2009  . TUBAL LIGATION      Prior to Admission medications   Medication Sig Start Date End Date Taking? Authorizing Provider  acetaminophen (TYLENOL) 500 MG tablet Take 500 mg by mouth every 6 (six) hours as needed for moderate pain.  11/04/16   [provider]  aspirin 81 MG EC tablet Take 81 mg by mouth daily.     [provider]  atorvastatin (LIPITOR) 10 MG tablet Take 10 mg by mouth daily.  10/16/16   [provider]  benazepril (LOTENSIN) 40 MG tablet Take 40 mg by mouth daily.  09/11/17 09/11/18  [provider]  brimonidine (ALPHAGAN) 0.2 % ophthalmic solution Place 1 drop into the right eye 2 (two) times daily.     [provider]  carvedilol (COREG) 12.5 MG tablet Take 12.5 mg by mouth 2 (two) times daily with a meal.     [provider]  cephALEXin (KEFLEX) 500 MG capsule Take 1 capsule (500 mg total) by mouth 3 (three) times daily. 04/25/18   Paulette Blanch, MD  chlorhexidine (PERIDEX) 0.12 % solution Use as directed 15 mLs in the mouth or throat 2 (two) times daily.  09/07/17   [provider]  Coenzyme Q10 (COQ10 PO) Take 1 tablet by mouth daily.    [provider]  dorzolamide-timolol (COSOPT) 22.3-6.8 MG/ML ophthalmic solution Place 1 drop into both eyes 2 (two) times daily. 03/12/18   [provider]  hydrALAZINE (APRESOLINE) 100 MG tablet Take 100 mg by mouth daily.  02/04/18   [provider]  ibuprofen (ADVIL,MOTRIN) 800 MG tablet Take 1 tablet (800 mg total) by mouth every 8 (eight) hours as needed for mild pain or moderate pain. 04/13/18   Lysle Pearl, Isami, DO  latanoprost (XALATAN) 0.005 % ophthalmic solution Place 1 drop into both eyes at bedtime.  01/30/17   [provider]  metFORMIN (GLUCOPHAGE) 500 MG tablet Take 500 mg by mouth 2 (two) times  daily with a meal.  10/23/16   [provider]  Multiple Vitamin (MULTI-VITAMINS) TABS Take 1 tablet by mouth daily.     [provider]  omeprazole (PRILOSEC) 40 MG capsule Take 40 mg by mouth daily.  12/02/16   [provider]  Specialty Vitamins Products (MAGNESIUM, AMINO ACID CHELATE,) 133 MG tablet Take 1 tablet by mouth daily.    [provider]  trolamine salicylate (ASPERCREME) 10 % cream Apply 1 application topically as needed for muscle pain.    [provider]    Allergies Shellfish allergy; Iodine; and Sulfa antibiotics  Family History  Problem Relation Age of Onset  . Diabetes Sister   . Diabetes Brother   . Diabetes Sister   . Breast cancer Other   . Hypertension Mother   . Glaucoma Mother   . Heart attack Father     Social History Social History   Tobacco Use  . Smoking status: Never Smoker  . Smokeless tobacco: Never Used  Substance Use Topics  . Alcohol use: Never    Frequency: Never  . Drug use: Never    Review of Systems  Constitutional: No fever/chills Eyes: No visual changes. ENT: No sore throat. Cardiovascular: Denies chest pain. Respiratory: Denies shortness of breath. Gastrointestinal: No abdominal pain.  No nausea, no vomiting.  No diarrhea.  No constipation. Genitourinary: Negative for dysuria. Musculoskeletal: Negative for back pain. Skin: Positive for postoperative drainage.  Negative for rash. Neurological: Negative for headaches, focal weakness or numbness.   ____________________________________________   PHYSICAL EXAM:  VITAL SIGNS: ED Triage Vitals  Enc Vitals Group     BP 04/24/18 2140 (!) 173/64     Pulse Rate 04/24/18 2140 76     Resp 04/24/18 2140 18     Temp 04/24/18 2140 100 F (37.8 C)     Temp Source 04/24/18 2140 Oral     SpO2 04/24/18 2140 99 %     Weight 04/24/18 2145 162 lb 0.6 oz (73.5 kg)     Height 04/24/18 2145 5\' 6"  (1.676 m)     Head Circumference --      Peak  Flow --      Pain Score 04/24/18 2145 5     Pain Loc --      Pain Edu? --      Excl. in [place]? --     Constitutional: Alert and oriented. Well appearing and in no acute distress. Eyes: Conjunctivae are normal. PERRL. EOMI. Head: Atraumatic. Nose: No congestion/rhinnorhea. Mouth/Throat: Mucous membranes are moist.  Oropharynx non-erythematous. Neck: No stridor.   Cardiovascular: Normal rate, regular rhythm. Grossly normal heart sounds.  Good peripheral circulation. Respiratory: Normal respiratory effort.  No retractions. Lungs CTAB. Breasts: Healing postop site beneath left breast.  There is a tiny area on the lateral aspect of patient's scar which is draining serosanguineous  fluid.  No surrounding warmth or erythema. Gastrointestinal: Soft and nontender. No distention. No abdominal bruits. No CVA tenderness. Musculoskeletal: No lower extremity tenderness nor edema.  No joint effusions. Neurologic:  Normal speech and language. No gross focal neurologic deficits are appreciated. No gait instability. Skin:  Skin is warm, dry and intact. No rash noted. Psychiatric: Mood and affect are normal. Speech and behavior are normal.  ____________________________________________   LABS (all labs ordered are listed, but only abnormal results are displayed)  Labs Reviewed  COMPREHENSIVE METABOLIC PANEL - Abnormal; Notable for the following components:      Result Value   Sodium 128 (*)    Potassium 3.3 (*)    Chloride 97 (*)    Glucose, Bld 144 (*)    AST 13 (*)    All other components within normal limits  CBC WITH DIFFERENTIAL/PLATELET - Abnormal; Notable for the following components:   Hemoglobin 10.1 (*)    HCT 31.1 (*)    MCV 72.8 (*)    MCH 23.7 (*)    Monocytes Absolute 1.2 (*)    All other components within normal limits  CULTURE, BLOOD (ROUTINE X 2)  CULTURE, BLOOD (ROUTINE X 2)  PROTIME-INR  URINALYSIS, COMPLETE (UACMP) WITH MICROSCOPIC  CG4 I-STAT (LACTIC ACID)  I-STAT CG4  LACTIC ACID, ED  I-STAT CG4 LACTIC ACID, ED   ____________________________________________  EKG  None ____________________________________________  RADIOLOGY  ED MD interpretation: No acute cardiopulmonary process  Official radiology report(s): Dg Chest 2 View  Result Date: 04/24/2018 CLINICAL DATA:  Left breast lumpectomy with postop bleeding and fever EXAM: CHEST - 2 VIEW COMPARISON:  07/16/2007 FINDINGS: The heart size and mediastinal contours are within normal limits. Both lungs are clear. The visualized skeletal structures are unremarkable. IMPRESSION: No active cardiopulmonary disease. Electronically Signed   By: Ulyses Jarred M.D.   On: 04/24/2018 22:54    ____________________________________________   PROCEDURES  Procedure(s) performed: None  Procedures  Critical Care performed: No  ____________________________________________   INITIAL IMPRESSION / ASSESSMENT AND PLAN / ED COURSE  As part of my medical decision making, I reviewed the following data within the electronic MEDICAL RECORD NUMBER History obtained from family, Nursing notes reviewed and incorporated, Labs reviewed and Notes from prior ED visits   77 year old female who presents with post-op drainage.  Differential diagnosis includes but is not limited to abscess, seroma, hematoma, etc.  Laboratory results remarkable for normal WBC, normal lactic acid, stable hyponatremia.  Noted patient's temperature 100 F.  Will discuss with surgeon on-call.  Clinical Course as of Apr 26 31  Sun Apr 25, 2018  0028 Discussed case with Dr. Hampton Abbot who is on-call for general surgery.  Does not recommend imaging or swabs for culture.  Agrees with placing patient on Keflex.  Also recommends patient wear her breast binder.  She will keep her appointment with Dr. Lysle Pearl on Monday.  Strict return precautions given.  Patient and spouse verbalized understanding and agree with plan of care.   [JS]    Clinical Course User  Index [JS] Paulette Blanch, MD     ____________________________________________   FINAL CLINICAL IMPRESSION(S) / ED DIAGNOSES  Final diagnoses:  Seroma of breast     ED Discharge Orders         Ordered    cephALEXin (KEFLEX) 500 MG capsule  3 times daily     04/25/18 0031           Note:  This document was prepared  using Systems analyst and may include unintentional dictation errors.    Paulette Blanch, MD 04/25/18 [phone]

## 2018-04-24 NOTE — ED Triage Notes (Signed)
FIRST NURSE NOTE-pt reports bleeding from post op site.  Ambulatory. NAD.

## 2018-04-24 NOTE — ED Notes (Signed)
Pt to xray via wheelchair

## 2018-04-24 NOTE — ED Triage Notes (Signed)
Pt had a lumpectomy of left breast on 12/17 performed by Dr Rosey Bath; was seen here on 12/26 after she had some bleeding from the incision site; pt says the bleeding increased today; soreness to left chest and rib cage; pt arrived with washcloth and binding to area; pt has already saturated through both since leaving her house;

## 2018-04-24 NOTE — ED Notes (Signed)
Pt to the ER for excessive drainage to the left breast r/t a lumpectomy. Pt has an incision under the left breast which has saturated the dressing with bloddy draiange. Pain present but no fever or foul odor.

## 2018-04-25 DIAGNOSIS — N6489 Other specified disorders of breast: Secondary | ICD-10-CM | POA: Diagnosis not present

## 2018-04-25 MED ORDER — CEPHALEXIN 500 MG PO CAPS
500.0000 mg | ORAL_CAPSULE | Freq: Once | ORAL | Status: AC
  Administered 2018-04-25: 500 mg via ORAL
  Filled 2018-04-25: qty 1

## 2018-04-25 MED ORDER — CEPHALEXIN 500 MG PO CAPS: 500.0000 mg | ORAL_CAPSULE | Freq: Three times a day (TID) | ORAL | 0 refills | Status: DC

## 2018-04-25 NOTE — ED Notes (Signed)
Pt awaiting bigger breast binder from surgery.

## 2018-04-25 NOTE — Discharge Instructions (Addendum)
1.  Take antibiotic as prescribed (Keflex 500 mg 3 times daily x7 days). 2.  Wear your breast binder as instructed by your surgeon. 3.  Return to the ER for worsening symptoms, redness, purulent discharge, fever, vomiting, difficulty breathing or other concerns.

## 2018-04-28 ENCOUNTER — Observation Stay: Payer: Medicare Other | Admitting: Anesthesiology

## 2018-04-28 ENCOUNTER — Inpatient Hospital Stay
Admission: EM | Admit: 2018-04-28 | Discharge: 2018-05-03 | DRG: 857 | Disposition: A | Discharge status: Home Health | Payer: Medicare Other | Attending: Surgery | Admitting: Surgery

## 2018-04-28 ENCOUNTER — Other Ambulatory Visit: Payer: Self-pay

## 2018-04-28 ENCOUNTER — Encounter
Admission: EM | Disposition: A | Discharge status: Home Health | Payer: Self-pay | Source: Home / Self Care | Attending: Surgery

## 2018-04-28 ENCOUNTER — Encounter: Payer: Self-pay | Admitting: Emergency Medicine

## 2018-04-28 DIAGNOSIS — Y838 Other surgical procedures as the cause of abnormal reaction of the patient, or of later complication, without mention of misadventure at the time of the procedure: Secondary | ICD-10-CM | POA: Diagnosis present

## 2018-04-28 DIAGNOSIS — I10 Essential (primary) hypertension: Secondary | ICD-10-CM | POA: Diagnosis present

## 2018-04-28 DIAGNOSIS — C50312 Malignant neoplasm of lower-inner quadrant of left female breast: Secondary | ICD-10-CM | POA: Diagnosis present

## 2018-04-28 DIAGNOSIS — N99821 Postprocedural hemorrhage and hematoma of a genitourinary system organ or structure following other procedure: Secondary | ICD-10-CM

## 2018-04-28 DIAGNOSIS — Z83511 Family history of glaucoma: Secondary | ICD-10-CM

## 2018-04-28 DIAGNOSIS — Z9841 Cataract extraction status, right eye: Secondary | ICD-10-CM

## 2018-04-28 DIAGNOSIS — Z170 Estrogen receptor positive status [ER+]: Secondary | ICD-10-CM | POA: Diagnosis present

## 2018-04-28 DIAGNOSIS — Z79899 Other long term (current) drug therapy: Secondary | ICD-10-CM

## 2018-04-28 DIAGNOSIS — Z882 Allergy status to sulfonamides status: Secondary | ICD-10-CM

## 2018-04-28 DIAGNOSIS — E785 Hyperlipidemia, unspecified: Secondary | ICD-10-CM | POA: Diagnosis present

## 2018-04-28 DIAGNOSIS — E119 Type 2 diabetes mellitus without complications: Secondary | ICD-10-CM | POA: Diagnosis present

## 2018-04-28 DIAGNOSIS — Z791 Long term (current) use of non-steroidal anti-inflammatories (NSAID): Secondary | ICD-10-CM

## 2018-04-28 DIAGNOSIS — Z9012 Acquired absence of left breast and nipple: Secondary | ICD-10-CM

## 2018-04-28 DIAGNOSIS — C50912 Malignant neoplasm of unspecified site of left female breast: Secondary | ICD-10-CM | POA: Diagnosis present

## 2018-04-28 DIAGNOSIS — Z888 Allergy status to other drugs, medicaments and biological substances status: Secondary | ICD-10-CM

## 2018-04-28 DIAGNOSIS — T8149XA Infection following a procedure, other surgical site, initial encounter: Secondary | ICD-10-CM | POA: Diagnosis not present

## 2018-04-28 DIAGNOSIS — Z8249 Family history of ischemic heart disease and other diseases of the circulatory system: Secondary | ICD-10-CM

## 2018-04-28 DIAGNOSIS — Z833 Family history of diabetes mellitus: Secondary | ICD-10-CM

## 2018-04-28 DIAGNOSIS — N611 Abscess of the breast and nipple: Secondary | ICD-10-CM | POA: Diagnosis present

## 2018-04-28 DIAGNOSIS — K219 Gastro-esophageal reflux disease without esophagitis: Secondary | ICD-10-CM | POA: Diagnosis present

## 2018-04-28 DIAGNOSIS — Z7982 Long term (current) use of aspirin: Secondary | ICD-10-CM

## 2018-04-28 DIAGNOSIS — C50919 Malignant neoplasm of unspecified site of unspecified female breast: Secondary | ICD-10-CM | POA: Diagnosis present

## 2018-04-28 DIAGNOSIS — Z9842 Cataract extraction status, left eye: Secondary | ICD-10-CM

## 2018-04-28 DIAGNOSIS — Z803 Family history of malignant neoplasm of breast: Secondary | ICD-10-CM

## 2018-04-28 DIAGNOSIS — E871 Hypo-osmolality and hyponatremia: Secondary | ICD-10-CM | POA: Diagnosis present

## 2018-04-28 DIAGNOSIS — Z961 Presence of intraocular lens: Secondary | ICD-10-CM | POA: Diagnosis present

## 2018-04-28 DIAGNOSIS — E7800 Pure hypercholesterolemia, unspecified: Secondary | ICD-10-CM | POA: Diagnosis present

## 2018-04-28 DIAGNOSIS — Z981 Arthrodesis status: Secondary | ICD-10-CM

## 2018-04-28 DIAGNOSIS — Z7984 Long term (current) use of oral hypoglycemic drugs: Secondary | ICD-10-CM

## 2018-04-28 DIAGNOSIS — D509 Iron deficiency anemia, unspecified: Secondary | ICD-10-CM | POA: Diagnosis present

## 2018-04-28 DIAGNOSIS — K5900 Constipation, unspecified: Secondary | ICD-10-CM | POA: Diagnosis not present

## 2018-04-28 HISTORY — PX: INCISION AND DRAINAGE ABSCESS: SHX5864

## 2018-04-28 LAB — CBC WITH DIFFERENTIAL/PLATELET
Abs Immature Granulocytes: 0.02 10*3/uL (ref 0.00–0.07)
Basophils Absolute: 0 10*3/uL (ref 0.0–0.1)
Basophils Relative: 1 %
EOS ABS: 0.1 10*3/uL (ref 0.0–0.5)
Eosinophils Relative: 3 %
HCT: 30.5 % — ABNORMAL LOW (ref 36.0–46.0)
Hemoglobin: 9.7 g/dL — ABNORMAL LOW (ref 12.0–15.0)
Immature Granulocytes: 0 %
Lymphocytes Relative: 22 %
Lymphs Abs: 1 10*3/uL (ref 0.7–4.0)
MCH: 23.6 pg — AB (ref 26.0–34.0)
MCHC: 31.8 g/dL (ref 30.0–36.0)
MCV: 74.2 fL — ABNORMAL LOW (ref 80.0–100.0)
Monocytes Absolute: 0.6 10*3/uL (ref 0.1–1.0)
Monocytes Relative: 13 %
Neutro Abs: 2.7 10*3/uL (ref 1.7–7.7)
Neutrophils Relative %: 61 %
Platelets: 310 10*3/uL (ref 150–400)
RBC: 4.11 MIL/uL (ref 3.87–5.11)
RDW: 14.8 % (ref 11.5–15.5)
WBC: 4.5 10*3/uL (ref 4.0–10.5)
nRBC: 0 % (ref 0.0–0.2)

## 2018-04-28 LAB — BASIC METABOLIC PANEL
Anion gap: 8 (ref 5–15)
BUN: 12 mg/dL (ref 8–23)
CALCIUM: 9 mg/dL (ref 8.9–10.3)
CO2: 21 mmol/L — ABNORMAL LOW (ref 22–32)
Chloride: 99 mmol/L (ref 98–111)
Creatinine, Ser: 0.67 mg/dL (ref 0.44–1.00)
GFR calc Af Amer: >60 mL/min (ref >60)
GFR calc non Af Amer: >60 mL/min (ref >60)
Glucose, Bld: 126 mg/dL — ABNORMAL HIGH (ref 70–99)
Potassium: 4.1 mmol/L (ref 3.5–5.1)
Sodium: 128 mmol/L — ABNORMAL LOW (ref 135–145)

## 2018-04-28 LAB — GLUCOSE, CAPILLARY: Glucose-Capillary: 121 mg/dL — ABNORMAL HIGH (ref 70–99)

## 2018-04-28 SURGERY — INCISION AND DRAINAGE, ABSCESS
Anesthesia: General | Laterality: Left

## 2018-04-28 MED ORDER — FENTANYL CITRATE (PF) 100 MCG/2ML IJ SOLN
INTRAMUSCULAR | Status: AC
  Administered 2018-04-28: 25 ug via INTRAVENOUS
  Filled 2018-04-28: qty 2

## 2018-04-28 MED ORDER — ADULT MULTIVITAMIN W/MINERALS CH
1.0000 | ORAL_TABLET | Freq: Every day | ORAL | Status: DC
  Administered 2018-04-29 – 2018-05-03 (×5): 1 via ORAL
  Filled 2018-04-28 (×5): qty 1

## 2018-04-28 MED ORDER — BENAZEPRIL HCL 20 MG PO TABS
40.0000 mg | ORAL_TABLET | Freq: Every day | ORAL | Status: DC
  Administered 2018-04-29 – 2018-05-03 (×5): 40 mg via ORAL
  Filled 2018-04-28 (×6): qty 2

## 2018-04-28 MED ORDER — MORPHINE SULFATE (PF) 2 MG/ML IV SOLN
2.0000 mg | INTRAVENOUS | Status: DC | PRN
  Administered 2018-04-29: 2 mg via INTRAVENOUS
  Filled 2018-04-28: qty 1

## 2018-04-28 MED ORDER — FENTANYL CITRATE (PF) 100 MCG/2ML IJ SOLN
INTRAMUSCULAR | Status: AC
  Filled 2018-04-28: qty 2

## 2018-04-28 MED ORDER — HYDROCODONE-ACETAMINOPHEN 5-325 MG PO TABS
1.0000 | ORAL_TABLET | ORAL | Status: DC | PRN
  Administered 2018-04-29 – 2018-04-30 (×4): 1 via ORAL
  Filled 2018-04-28 (×4): qty 1

## 2018-04-28 MED ORDER — DOCUSATE SODIUM 100 MG PO CAPS
100.0000 mg | ORAL_CAPSULE | Freq: Two times a day (BID) | ORAL | Status: DC | PRN
  Administered 2018-04-29 – 2018-04-30 (×2): 100 mg via ORAL
  Filled 2018-04-28 (×2): qty 1

## 2018-04-28 MED ORDER — DORZOLAMIDE HCL-TIMOLOL MAL 2-0.5 % OP SOLN
1.0000 [drp] | Freq: Two times a day (BID) | OPHTHALMIC | Status: DC
  Administered 2018-04-28 – 2018-05-03 (×10): 1 [drp] via OPHTHALMIC
  Filled 2018-04-28: qty 10

## 2018-04-28 MED ORDER — ENOXAPARIN SODIUM 40 MG/0.4ML ~~LOC~~ SOLN
40.0000 mg | SUBCUTANEOUS | Status: DC
  Administered 2018-04-29: 40 mg via SUBCUTANEOUS
  Filled 2018-04-28 (×3): qty 0.4

## 2018-04-28 MED ORDER — HYDRALAZINE HCL 50 MG PO TABS
100.0000 mg | ORAL_TABLET | Freq: Every day | ORAL | Status: DC
  Administered 2018-04-28 – 2018-05-02 (×5): 100 mg via ORAL
  Filled 2018-04-28 (×5): qty 2

## 2018-04-28 MED ORDER — CARVEDILOL 12.5 MG PO TABS
12.5000 mg | ORAL_TABLET | Freq: Two times a day (BID) | ORAL | Status: DC
  Administered 2018-04-28 – 2018-05-03 (×10): 12.5 mg via ORAL
  Filled 2018-04-28 (×10): qty 1

## 2018-04-28 MED ORDER — BUPIVACAINE LIPOSOME 1.3 % IJ SUSP
INTRAMUSCULAR | Status: AC
  Filled 2018-04-28: qty 20

## 2018-04-28 MED ORDER — FENTANYL CITRATE (PF) 100 MCG/2ML IJ SOLN
INTRAMUSCULAR | Status: DC | PRN
  Administered 2018-04-28 (×2): 25 ug via INTRAVENOUS
  Administered 2018-04-28: 50 ug via INTRAVENOUS

## 2018-04-28 MED ORDER — ONDANSETRON 4 MG PO TBDP: 4.0000 mg | ORAL_TABLET | Freq: Four times a day (QID) | ORAL | Status: DC | PRN

## 2018-04-28 MED ORDER — LACTATED RINGERS IV SOLN
INTRAVENOUS | Status: DC
  Administered 2018-04-28 – 2018-04-29 (×3): via INTRAVENOUS

## 2018-04-28 MED ORDER — ATORVASTATIN CALCIUM 10 MG PO TABS
10.0000 mg | ORAL_TABLET | Freq: Every day | ORAL | Status: DC
  Administered 2018-04-28 – 2018-05-02 (×5): 10 mg via ORAL
  Filled 2018-04-28 (×5): qty 1

## 2018-04-28 MED ORDER — PROPOFOL 10 MG/ML IV BOLUS
INTRAVENOUS | Status: DC | PRN
  Administered 2018-04-28: 100 mg via INTRAVENOUS

## 2018-04-28 MED ORDER — FENTANYL CITRATE (PF) 100 MCG/2ML IJ SOLN
25.0000 ug | INTRAMUSCULAR | Status: AC | PRN
  Administered 2018-04-28 (×6): 25 ug via INTRAVENOUS

## 2018-04-28 MED ORDER — MENTHOL 3 MG MT LOZG
1.0000 | LOZENGE | OROMUCOSAL | Status: DC | PRN
  Administered 2018-04-29: 3 mg via ORAL
  Filled 2018-04-28: qty 9

## 2018-04-28 MED ORDER — TROLAMINE SALICYLATE 10 % EX CREA
1.0000 "application " | TOPICAL_CREAM | CUTANEOUS | Status: DC | PRN
  Filled 2018-04-28: qty 85

## 2018-04-28 MED ORDER — DEXAMETHASONE SODIUM PHOSPHATE 10 MG/ML IJ SOLN
INTRAMUSCULAR | Status: DC | PRN
  Administered 2018-04-28: 10 mg via INTRAVENOUS

## 2018-04-28 MED ORDER — MAGNESIUM OXIDE 400 (241.3 MG) MG PO TABS
400.0000 mg | ORAL_TABLET | Freq: Every day | ORAL | Status: DC
  Administered 2018-04-29 – 2018-05-03 (×5): 400 mg via ORAL
  Filled 2018-04-28 (×5): qty 1

## 2018-04-28 MED ORDER — CHLORHEXIDINE GLUCONATE 0.12 % MT SOLN
15.0000 mL | Freq: Two times a day (BID) | OROMUCOSAL | Status: DC
  Administered 2018-04-29 – 2018-05-03 (×9): 15 mL via OROMUCOSAL
  Filled 2018-04-28 (×9): qty 15

## 2018-04-28 MED ORDER — CEFAZOLIN SODIUM-DEXTROSE 2-4 GM/100ML-% IV SOLN
2.0000 g | Freq: Three times a day (TID) | INTRAVENOUS | Status: DC
  Administered 2018-04-28 – 2018-05-02 (×11): 2 g via INTRAVENOUS
  Filled 2018-04-28 (×15): qty 100

## 2018-04-28 MED ORDER — LIDOCAINE HCL (CARDIAC) PF 100 MG/5ML IV SOSY
PREFILLED_SYRINGE | INTRAVENOUS | Status: DC | PRN
  Administered 2018-04-28 (×2): 100 mg via INTRAVENOUS

## 2018-04-28 MED ORDER — ONDANSETRON HCL 4 MG/2ML IJ SOLN: 4.0000 mg | Freq: Four times a day (QID) | INTRAMUSCULAR | Status: DC | PRN

## 2018-04-28 MED ORDER — TRAMADOL HCL 50 MG PO TABS: 50.0000 mg | ORAL_TABLET | Freq: Four times a day (QID) | ORAL | Status: DC | PRN

## 2018-04-28 MED ORDER — ONDANSETRON HCL 4 MG/2ML IJ SOLN: 4.0000 mg | Freq: Once | INTRAMUSCULAR | Status: DC | PRN

## 2018-04-28 MED ORDER — LATANOPROST 0.005 % OP SOLN
1.0000 [drp] | Freq: Every day | OPHTHALMIC | Status: DC
  Administered 2018-04-28 – 2018-05-02 (×5): 1 [drp] via OPHTHALMIC
  Filled 2018-04-28: qty 2.5

## 2018-04-28 MED ORDER — BUPIVACAINE LIPOSOME 1.3 % IJ SUSP
INTRAMUSCULAR | Status: DC | PRN
  Administered 2018-04-28: 20 mL

## 2018-04-28 MED ORDER — LACTATED RINGERS IV SOLN
INTRAVENOUS | Status: DC | PRN
  Administered 2018-04-28: 17:00:00 via INTRAVENOUS

## 2018-04-28 MED ORDER — BRIMONIDINE TARTRATE 0.2 % OP SOLN
1.0000 [drp] | Freq: Two times a day (BID) | OPHTHALMIC | Status: DC
  Administered 2018-04-28 – 2018-05-03 (×10): 1 [drp] via OPHTHALMIC
  Filled 2018-04-28: qty 5

## 2018-04-28 MED ORDER — PANTOPRAZOLE SODIUM 40 MG PO TBEC
40.0000 mg | DELAYED_RELEASE_TABLET | Freq: Every day | ORAL | Status: DC
  Administered 2018-04-29 – 2018-05-03 (×5): 40 mg via ORAL
  Filled 2018-04-28 (×5): qty 1

## 2018-04-28 MED ORDER — METFORMIN HCL 500 MG PO TABS
500.0000 mg | ORAL_TABLET | Freq: Two times a day (BID) | ORAL | Status: DC
  Administered 2018-04-29 – 2018-05-03 (×9): 500 mg via ORAL
  Filled 2018-04-28 (×9): qty 1

## 2018-04-28 SURGICAL SUPPLY — 27 items
BLADE SURG 15 STRL LF DISP TIS (BLADE) ×1 IMPLANT
BLADE SURG 15 STRL SS (BLADE) ×1
CANISTER SUCT 1200ML W/VALVE (MISCELLANEOUS) ×2 IMPLANT
CHLORAPREP W/TINT 26ML (MISCELLANEOUS) ×2 IMPLANT
COVER WAND RF STERILE (DRAPES) IMPLANT
DRAPE LAPAROTOMY 100X77 ABD (DRAPES) ×2 IMPLANT
ELECT REM PT RETURN 9FT ADLT (ELECTROSURGICAL) ×2
ELECTRODE REM PT RTRN 9FT ADLT (ELECTROSURGICAL) ×1 IMPLANT
GLOVE BIOGEL PI IND STRL 7.0 (GLOVE) ×1 IMPLANT
GLOVE BIOGEL PI INDICATOR 7.0 (GLOVE) ×1
GLOVE SURG SYN 7.0 (GLOVE) ×4 IMPLANT
GOWN STRL REUS W/ TWL LRG LVL3 (GOWN DISPOSABLE) ×2 IMPLANT
GOWN STRL REUS W/TWL LRG LVL3 (GOWN DISPOSABLE) ×2
HANDPIECE INTERPULSE COAX TIP (DISPOSABLE) ×1
LABEL OR SOLS (LABEL) ×2 IMPLANT
NEEDLE HYPO 22GX1.5 SAFETY (NEEDLE) ×2 IMPLANT
NS IRRIG 500ML POUR BTL (IV SOLUTION) ×2 IMPLANT
PACK BASIN MINOR ARMC (MISCELLANEOUS) ×2 IMPLANT
SET HNDPC FAN SPRY TIP SCT (DISPOSABLE) ×1 IMPLANT
SUT MNCRL 4-0 (SUTURE) ×1
SUT MNCRL 4-0 27XMFL (SUTURE) ×1
SUT VIC AB 2-0 CT2 27 (SUTURE) IMPLANT
SUT VIC AB 3-0 SH 27 (SUTURE) ×1
SUT VIC AB 3-0 SH 27X BRD (SUTURE) ×1 IMPLANT
SUTURE MNCRL 4-0 27XMF (SUTURE) ×1 IMPLANT
SYR 10ML LL (SYRINGE) ×2 IMPLANT
TOWEL OR 17X26 4PK STRL BLUE (TOWEL DISPOSABLE) ×2 IMPLANT

## 2018-04-28 NOTE — Anesthesia Postprocedure Evaluation (Signed)
Anesthesia Post Note  Patient: Jackie Allison  Procedure(s) Performed: INCISION AND DRAINAGE ABSCESS (Left )  Patient location during evaluation: PACU Anesthesia Type: General Level of consciousness: awake and alert Pain management: pain level controlled Vital Signs Assessment: post-procedure vital signs reviewed and stable Respiratory status: spontaneous breathing, nonlabored ventilation, respiratory function stable and patient connected to nasal cannula oxygen Cardiovascular status: blood pressure returned to baseline and stable Postop Assessment: no apparent nausea or vomiting Anesthetic complications: no     Last Vitals:  Vitals:   04/28/18 1838 04/28/18 1939  BP: (!) 191/81 (!) 154/62  Pulse: 70 76  Resp: 16 18  Temp: 36.6 C 37 C  SpO2: 100% 97%    Last Pain:  Vitals:   04/28/18 1939  TempSrc: Oral  PainSc:                  Allex Lapoint S

## 2018-04-28 NOTE — Anesthesia Post-op Follow-up Note (Signed)
Anesthesia QCDR form completed.        

## 2018-04-28 NOTE — ED Triage Notes (Addendum)
Patient presents to the ED post breast surgery on 12/17.  Patient states her left breast started bleeding on Christmas day.  Patient states she saw her surgeon for a post op on Tuesday and he, "opened the incision and mashed some of the blood out."  Patient states her home health nurses who have been coming to do her dressing changes have been concerned with the amount of blood that has been draining from the incision site and this morning instructed patient to come to the ED.  The discharge papers from the post-op visit include instructions to come to the ED on the 1st and ask the  ED to page Dr. Lysle Pearl to perform wound check and change the dressing.

## 2018-04-28 NOTE — Transfer of Care (Signed)
Immediate Anesthesia Transfer of Care Note  Patient: Jackie Allison  Procedure(s) Performed: INCISION AND DRAINAGE ABSCESS (Left )  Patient Location: PACU  Anesthesia Type:General  Level of Consciousness: awake, alert  and oriented  Airway & Oxygen Therapy: Patient Spontanous Breathing  Post-op Assessment: Report given to RN and Post -op Vital signs reviewed and stable  Post vital signs: Reviewed and stable  Last Vitals:  Vitals Value Taken Time  BP    Temp    Pulse 83 04/28/2018  5:26 PM  Resp 18 04/28/2018  5:26 PM  SpO2 100 % 04/28/2018  5:26 PM  Vitals shown include unvalidated device data.  Last Pain:  Vitals:   04/28/18 1629  TempSrc:   PainSc: 4          Complications: No apparent anesthesia complications

## 2018-04-28 NOTE — Anesthesia Procedure Notes (Signed)
Procedure Name: LMA Insertion Date/Time: 04/28/2018 5:11 PM Performed by: Nelda Marseille, CRNA Pre-anesthesia Checklist: Patient identified, Patient being monitored, Timeout performed, Emergency Drugs available and Suction available Patient Re-evaluated:Patient Re-evaluated prior to induction Oxygen Delivery Method: Circle system utilized Preoxygenation: Pre-oxygenation with 100% oxygen Induction Type: IV induction Ventilation: Mask ventilation without difficulty LMA: LMA inserted LMA Size: 4.0 Tube type: Oral Number of attempts: 1 Placement Confirmation: positive ETCO2 and breath sounds checked- equal and bilateral Tube secured with: Tape Dental Injury: Teeth and Oropharynx as per pre-operative assessment

## 2018-04-28 NOTE — H&P (Signed)
  Subjective:   CC: left breast abscess  HPI: Jackie Allison is a 78 y.o. 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  See in office couple days prior for increased drainage from area.  Noted to have SSI so was drained in office.  Presents to ED for f/u wound check and she states the discharge amount has been about the same as prior to in office I&D.  Still complaining of some pain the area as well.   Current Medications: has a current medication list which includes the following prescription(s): acetaminophen, aspirin, atorvastatin, benazepril, blood glucose diagnostic, brimonidine, carvedilol, ubiquinol, dorzolamide-timolol, hydralazine, lancets, latanoprost, lorazepam, magnesium, aluminum hydroxide, metformin, multivitamin, omeprazole, onetouch verio mid control, onetouch verio system, tobramycin-dexamethasone, and hydrocodone-acetaminophen.  Allergies:  Allergies  Allergen Reactions  . Iodine Other (See Comments)  Has shellfish allergy  . Shellfish Containing Products Swelling  . Sulfa (Sulfonamide Antibiotics) Rash and Itching   ROS: A 15 point review of systems was performed and pertinent positives and negatives noted in HPI  Objective:    BP 145/66  Pulse 86  Temp 37 C (98.6 F) (Oral)  Ht 167.6 cm (5\' 6" )  Wt 74.9 kg (165 lb 2 oz)  LMP (LMP Unknown)  BMI 26.65 kg/m   Constitutional : alert, appears stated age, cooperative and no distress  Musculoskeletal: Steady gait and movement  Skin: Cool and moist. Dressing noted to have purulent drainage.  Wounds still open.   LABS:  N/A   RADS: N/A  Assessment:   Left breast SSI from previous lumpectomy for Breast CA. Recommend formal I&D and debridment in OR.   Alternatives include continued observation.  Benefits include possible symptom relief, source control. Discussed the risk of surgery including recurrence, chronic pain, poor cosmesis, poor/delayed wound healing, and possible  re-operation to address said risks. The risks of general anesthetic, if used, includes MI, CVA, sudden death or even reaction to anesthetic medications also discussed.  Typical post-op recovery time and need for wound care discussed as well.  The patient verbalized understanding and all questions were answered to the patient's satisfaction.  Admit to floor with ABX, IVF, NPO, until OR ready for procedure.

## 2018-04-28 NOTE — ED Notes (Signed)
Pt has not signed consent. Pending MD to fill-in consent for to be signed. Transport made aware. Pt changed into hospital gown. Transported to PACU.

## 2018-04-28 NOTE — Anesthesia Preprocedure Evaluation (Signed)
Anesthesia Evaluation  Patient identified by MRN, date of birth, ID band Patient awake    Reviewed: Allergy & Precautions, NPO status , Patient's Chart, lab work & pertinent test results, reviewed documented beta blocker date and time   Airway Mallampati: III  TM Distance: >3 FB     Dental  (+) Chipped   Pulmonary           Cardiovascular hypertension, Pt. on medications and Pt. on home beta blockers      Neuro/Psych    GI/Hepatic GERD  Controlled,  Endo/Other  diabetes, Type 2  Renal/GU      Musculoskeletal   Abdominal   Peds  Hematology  (+) anemia ,   Anesthesia Other Findings Hb 9.7. EKG ok.  Reproductive/Obstetrics                             Anesthesia Physical Anesthesia Plan  ASA: III  Anesthesia Plan: General   Post-op Pain Management:    Induction: Intravenous  PONV Risk Score and Plan:   Airway Management Planned: LMA  Additional Equipment:   Intra-op Plan:   Post-operative Plan:   Informed Consent: I have reviewed the patients History and Physical, chart, labs and discussed the procedure including the risks, benefits and alternatives for the proposed anesthesia with the patient or authorized representative who has indicated his/her understanding and acceptance.     Plan Discussed with: CRNA  Anesthesia Plan Comments:         Anesthesia Quick Evaluation

## 2018-04-28 NOTE — ED Provider Notes (Signed)
[facility] Emergency Department Provider Note   ____________________________________________   First MD Initiated Contact with Patient 04/28/18 1302     (approximate)  I have reviewed the triage vital signs and the nursing notes.   HISTORY  Chief Complaint Post-op Problem   HPI Jackie Allison is a 78 y.o. female who had a lumpectomy earlier in December.  She has been bleeding for the last 5 days she is come here twice today home health saw her again says she needs to come in Dr. Lysle Pearl will come down to evaluate her [place] office the other day.  Patient reports she's feeling somewhat weak.  Her dressing is saturated again was just changed this morning.  Past Medical History:  Diagnosis Date  . Anemia    chronic microcytic anemia  . Cancer ([place]) 2019   left breast  . Diabetes mellitus without complication ([place])   . GERD (gastroesophageal reflux disease)   . Glaucoma   . Hypercholesterolemia   . Hypertension     Patient Active Problem List   Diagnosis Date Noted  . Lymphedema 02/14/2018  . 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 Hyperlipidemia 02/11/2018  . Diabetes ([name]) 02/11/2018    Past Surgical History:  Procedure Laterality Date  . BACK SURGERY    . BREAST BIOPSY Right    stereo- neg  . BREAST BIOPSY Left 02/2018  . BREAST EXCISIONAL BIOPSY Left 04/03/2018   left breast cancer lumpectomy   . BREAST LUMPECTOMY Left 04/03/2018  . CATARACT EXTRACTION W/ INTRAOCULAR LENS  IMPLANT, BILATERAL    . EYE SURGERY Bilateral    cataract extraction  . LUMBAR FUSION  2002?  . LUMBAR LAMINECTOMY  1997   titanium in back  . PARTIAL MASTECTOMY WITH AXILLARY SENTINEL LYMPH NODE BIOPSY Left 04/13/2018   Procedure: PARTIAL MASTECTOMY WITH AXILLARY SENTINEL LYMPH NODE BIOPSY  (No Needle Loc);  Surgeon: Benjamine Sprague, DO;  Location: [facility] ORS;  Service: General;  Laterality: Left;  . RETINAL DETACHMENT SURGERY Bilateral 2009  . TUBAL LIGATION       Prior to Admission medications   Medication Sig Start Date End Date Taking? Authorizing Provider  acetaminophen (TYLENOL) 500 MG tablet Take 500 mg by mouth every 6 (six) hours as needed for moderate pain.  11/04/16   [provider]  aspirin 81 MG EC tablet Take 81 mg by mouth daily.     [provider]  atorvastatin (LIPITOR) 10 MG tablet Take 10 mg by mouth daily.  10/16/16   [provider]  benazepril (LOTENSIN) 40 MG tablet Take 40 mg by mouth daily.  09/11/17 09/11/18  [provider]  brimonidine (ALPHAGAN) 0.2 % ophthalmic solution Place 1 drop into the right eye 2 (two) times daily.     [provider]  carvedilol (COREG) 12.5 MG tablet Take 12.5 mg by mouth 2 (two) times daily with a meal.     [provider]  cephALEXin (KEFLEX) 500 MG capsule Take 1 capsule (500 mg total) by mouth 3 (three) times daily. 04/25/18   Paulette Blanch, MD  chlorhexidine (PERIDEX) 0.12 % solution Use as directed 15 mLs in the mouth or throat 2 (two) times daily.  09/07/17   [provider]  Coenzyme Q10 (COQ10 PO) Take 1 tablet by mouth daily.    [provider]  dorzolamide-timolol (COSOPT) 22.3-6.8 MG/ML ophthalmic solution Place 1 drop into both eyes 2 (two) times daily. 03/12/18   [provider]  hydrALAZINE (APRESOLINE) 100  MG tablet Take 100 mg by mouth daily.  02/04/18   [provider]  ibuprofen (ADVIL,MOTRIN) 800 MG tablet Take 1 tablet (800 mg total) by mouth every 8 (eight) hours as needed for mild pain or moderate pain. 04/13/18   Lysle Pearl, Isami, DO  latanoprost (XALATAN) 0.005 % ophthalmic solution Place 1 drop into both eyes at bedtime.  01/30/17   [provider]  metFORMIN (GLUCOPHAGE) 500 MG tablet Take 500 mg by mouth 2 (two) times daily with a meal.  10/23/16   [provider]  Multiple Vitamin (MULTI-VITAMINS) TABS Take 1 tablet by mouth daily.     [provider]  omeprazole  (PRILOSEC) 40 MG capsule Take 40 mg by mouth daily.  12/02/16   [provider]  Specialty Vitamins Products (MAGNESIUM, AMINO ACID CHELATE,) 133 MG tablet Take 1 tablet by mouth daily.    [provider]  trolamine salicylate (ASPERCREME) 10 % cream Apply 1 application topically as needed for muscle pain.    [provider]    Allergies Shellfish allergy; Iodine; and Sulfa antibiotics  Family History  Problem Relation Age of Onset  . Diabetes Sister   . Diabetes Brother   . Diabetes Sister   . Breast cancer Other   . Hypertension Mother   . Glaucoma Mother   . Heart attack Father     Social History Social History   Tobacco Use  . Smoking status: Never Smoker  . Smokeless tobacco: Never Used  Substance Use Topics  . Alcohol use: Never    Frequency: Never  . Drug use: Never    Review of Systems  Constitutional: No fever/chills Eyes: No visual changes. ENT: No sore throat. Cardiovascular: Denies chest pain. Respiratory: Denies shortness of breath. Gastrointestinal: No abdominal pain.  No nausea, no vomiting.  No diarrhea.  No constipation. Genitourinary: Negative for dysuria. Musculoskeletal: Negative for back pain. Skin: Negative for rash. Neurological: Negative for headaches, focal weakness   ____________________________________________   PHYSICAL EXAM:  VITAL SIGNS: ED Triage Vitals  Enc Vitals Group     BP 04/28/18 1234 (!) 188/80     Pulse Rate 04/28/18 1234 66     Resp 04/28/18 1234 18     Temp 04/28/18 1234 98.2 F (36.8 C)     Temp Source 04/28/18 1234 Oral     SpO2 04/28/18 1234 100 %     Weight 04/28/18 1235 163 lb (73.9 kg)     Height 04/28/18 1235 5\' 6"  (1.676 m)     Head Circumference --      Peak Flow --      Pain Score 04/28/18 1235 4     Pain Loc --      Pain Edu? --      Excl. in [place]? --     Constitutional: Alert and oriented. Well appearing and in no acute distress. Eyes: Conjunctivae are normal.  Head:  Atraumatic. Nose: No congestion/rhinnorhea. Mouth/Throat: Mucous membranes are moist.  Oropharynx non-erythematous. Neck: No stridor. Cardiovascular: Normal rate, regular rhythm. Grossly normal heart sounds.  Good peripheral circulation.  Dressing is already wet with blood Respiratory: Normal respiratory effort.  No retractions. Lungs CTAB. Gastrointestinal: Soft and nontender. No distention. No abdominal bruits. No CVA tenderness. Musculoskeletal: No lower extremity tenderness bilateral 1+ edema.  . Neurologic:  Normal speech and language. No gross focal neurologic deficits are appreciated. Skin:  Skin is warm, dry and intact. No rash noted. Psychiatric: Mood and affect are normal. Speech and behavior  are normal.  ____________________________________________   LABS (all labs ordered are listed, but only abnormal results are displayed)  Labs Reviewed  CBC WITH DIFFERENTIAL/PLATELET  BASIC METABOLIC PANEL   ____________________________________________  EKG   ____________________________________________  RADIOLOGY  ED MD interpretation:  Official radiology report(s): No results found.  ____________________________________________   PROCEDURES  Procedure(s) performed:   Procedures  Critical Care performed:  ____________________________________________   INITIAL IMPRESSION / ASSESSMENT AND PLAN / ED COURSE  Patient with postop bleeding which is continuing for 5 days.  It seems to be bleeding fairly large amount.  We will repeat her CBC and BMP and if need be we will get her typed and screened as well.         ____________________________________________   FINAL CLINICAL IMPRESSION(S) / ED DIAGNOSES  Final diagnoses:  Postoperative vaginal bleeding following non-genitourinary procedure     ED Discharge Orders    None       Note:  This document was prepared using Dragon voice recognition software and may include unintentional dictation errors.      Nena Polio, MD 04/28/18 [phone]

## 2018-04-28 NOTE — Op Note (Addendum)
Preoperative diagnosis: left breast abscess Postoperative diagnosis: same  Procedure: Incision and drainage of left breast abscess  Anesthesia: MAC  Surgeon: Lysle Pearl  Wound Classification: Contaminated  Indications: Patient is a 78 y.o. female  presented with left breast abscess.  See H&P for further details.  Specimen: none  Complications: None  Estimated Blood Loss: 29m  Findings:  1. Left breast abscess, no loculations 2. purulent secretions drained and cultured 3. Adequate hemostasis.   Description of procedure: The patient was placed in the supine position and MAC anesthesia was induced. The area was prepped and draped in the usual sterile fashion. A timeout was completed verifying correct patient, procedure, site, positioning, and implant(s) and/or special equipment prior to beginning this procedure.   Inicision made to connect two incisions.  Full exposure of abscess cavity noted some remnant purulent fluid noted in lateral aspect, which was suctioned out.   Inspection of wound in entirely, noted no loculations, tracking or extensive fibrinous/necrotic tissue.  The wound itself measures approximately 13cm x 10cm x 3cm down to chest wall fascia.  Wound then irrigated extensively using pulse irrigator, exparel infused around cavity borders hemostasis confirmed and then packed with kerlex roll, dressed with abd pads and secured with paper tape.  The patient tolerated the procedure well and was taken to the postanesthesia care unit in satisfactory condition.

## 2018-04-28 NOTE — ED Notes (Signed)
Report given to OR nurse

## 2018-04-28 NOTE — Progress Notes (Signed)
Consulted with Keith Rake, Pharmacist, regarding Lotensin ordered; takes everyday, didn't take this am; Posey Pronto stated that if patient's SBP was 120-130's to hold Lotensin until tomorrow; BP 125/55, will start Lotensin in [place], RN 8:22 PM 04/28/2018

## 2018-04-29 ENCOUNTER — Encounter: Payer: Self-pay | Admitting: Surgery

## 2018-04-29 DIAGNOSIS — Z9842 Cataract extraction status, left eye: Secondary | ICD-10-CM | POA: Diagnosis not present

## 2018-04-29 DIAGNOSIS — T8149XA Infection following a procedure, other surgical site, initial encounter: Secondary | ICD-10-CM | POA: Diagnosis present

## 2018-04-29 DIAGNOSIS — I10 Essential (primary) hypertension: Secondary | ICD-10-CM | POA: Diagnosis present

## 2018-04-29 DIAGNOSIS — Z9012 Acquired absence of left breast and nipple: Secondary | ICD-10-CM | POA: Diagnosis not present

## 2018-04-29 DIAGNOSIS — Z7984 Long term (current) use of oral hypoglycemic drugs: Secondary | ICD-10-CM | POA: Diagnosis not present

## 2018-04-29 DIAGNOSIS — N611 Abscess of the breast and nipple: Secondary | ICD-10-CM | POA: Diagnosis present

## 2018-04-29 DIAGNOSIS — Y838 Other surgical procedures as the cause of abnormal reaction of the patient, or of later complication, without mention of misadventure at the time of the procedure: Secondary | ICD-10-CM | POA: Diagnosis present

## 2018-04-29 DIAGNOSIS — E871 Hypo-osmolality and hyponatremia: Secondary | ICD-10-CM | POA: Diagnosis present

## 2018-04-29 DIAGNOSIS — Z791 Long term (current) use of non-steroidal anti-inflammatories (NSAID): Secondary | ICD-10-CM | POA: Diagnosis not present

## 2018-04-29 DIAGNOSIS — Z9841 Cataract extraction status, right eye: Secondary | ICD-10-CM | POA: Diagnosis not present

## 2018-04-29 DIAGNOSIS — E119 Type 2 diabetes mellitus without complications: Secondary | ICD-10-CM | POA: Diagnosis present

## 2018-04-29 DIAGNOSIS — Z833 Family history of diabetes mellitus: Secondary | ICD-10-CM | POA: Diagnosis not present

## 2018-04-29 DIAGNOSIS — Z79899 Other long term (current) drug therapy: Secondary | ICD-10-CM | POA: Diagnosis not present

## 2018-04-29 DIAGNOSIS — Z888 Allergy status to other drugs, medicaments and biological substances status: Secondary | ICD-10-CM | POA: Diagnosis not present

## 2018-04-29 DIAGNOSIS — Z7982 Long term (current) use of aspirin: Secondary | ICD-10-CM | POA: Diagnosis not present

## 2018-04-29 DIAGNOSIS — D509 Iron deficiency anemia, unspecified: Secondary | ICD-10-CM | POA: Diagnosis present

## 2018-04-29 DIAGNOSIS — Z882 Allergy status to sulfonamides status: Secondary | ICD-10-CM | POA: Diagnosis not present

## 2018-04-29 DIAGNOSIS — Z803 Family history of malignant neoplasm of breast: Secondary | ICD-10-CM | POA: Diagnosis not present

## 2018-04-29 DIAGNOSIS — C50912 Malignant neoplasm of unspecified site of left female breast: Secondary | ICD-10-CM | POA: Diagnosis present

## 2018-04-29 DIAGNOSIS — K219 Gastro-esophageal reflux disease without esophagitis: Secondary | ICD-10-CM | POA: Diagnosis present

## 2018-04-29 DIAGNOSIS — Z961 Presence of intraocular lens: Secondary | ICD-10-CM | POA: Diagnosis present

## 2018-04-29 DIAGNOSIS — E7800 Pure hypercholesterolemia, unspecified: Secondary | ICD-10-CM | POA: Diagnosis present

## 2018-04-29 DIAGNOSIS — Z981 Arthrodesis status: Secondary | ICD-10-CM | POA: Diagnosis not present

## 2018-04-29 DIAGNOSIS — Z83511 Family history of glaucoma: Secondary | ICD-10-CM | POA: Diagnosis not present

## 2018-04-29 DIAGNOSIS — E785 Hyperlipidemia, unspecified: Secondary | ICD-10-CM | POA: Diagnosis present

## 2018-04-29 LAB — CBC
HCT: 29 % — ABNORMAL LOW (ref 36.0–46.0)
Hemoglobin: 9.4 g/dL — ABNORMAL LOW (ref 12.0–15.0)
MCH: 23.4 pg — ABNORMAL LOW (ref 26.0–34.0)
MCHC: 32.4 g/dL (ref 30.0–36.0)
MCV: 72.3 fL — ABNORMAL LOW (ref 80.0–100.0)
NRBC: 0 % (ref 0.0–0.2)
Platelets: 308 10*3/uL (ref 150–400)
RBC: 4.01 MIL/uL (ref 3.87–5.11)
RDW: 14.6 % (ref 11.5–15.5)
WBC: 3.8 10*3/uL — ABNORMAL LOW (ref 4.0–10.5)

## 2018-04-29 LAB — BASIC METABOLIC PANEL
Anion gap: 8 (ref 5–15)
BUN: 11 mg/dL (ref 8–23)
CO2: 22 mmol/L (ref 22–32)
Calcium: 8.8 mg/dL — ABNORMAL LOW (ref 8.9–10.3)
Chloride: 98 mmol/L (ref 98–111)
Creatinine, Ser: 0.64 mg/dL (ref 0.44–1.00)
GFR calc Af Amer: >60 mL/min (ref >60)
Glucose, Bld: 177 mg/dL — ABNORMAL HIGH (ref 70–99)
Potassium: 3.9 mmol/L (ref 3.5–5.1)
Sodium: 128 mmol/L — ABNORMAL LOW (ref 135–145)

## 2018-04-29 LAB — CULTURE, BLOOD (ROUTINE X 2)
Culture: NO GROWTH
Special Requests: ADEQUATE

## 2018-04-29 LAB — MAGNESIUM: Magnesium: 1.4 mg/dL — ABNORMAL LOW (ref 1.7–2.4)

## 2018-04-29 LAB — SODIUM, URINE, RANDOM: Sodium, Ur: 67 mmol/L

## 2018-04-29 LAB — OSMOLALITY, URINE: Osmolality, Ur: 673 mOsm/kg (ref 300–900)

## 2018-04-29 LAB — PHOSPHORUS: Phosphorus: 2.7 mg/dL (ref 2.5–4.6)

## 2018-04-29 MED ORDER — INFLUENZA VAC SPLIT HIGH-DOSE 0.5 ML IM SUSY
0.5000 mL | PREFILLED_SYRINGE | INTRAMUSCULAR | Status: DC
  Filled 2018-04-29: qty 0.5

## 2018-04-29 MED ORDER — MAGNESIUM SULFATE 2 GM/50ML IV SOLN
2.0000 g | Freq: Once | INTRAVENOUS | Status: AC
  Administered 2018-04-29: 2 g via INTRAVENOUS
  Filled 2018-04-29: qty 50

## 2018-04-29 MED ORDER — SODIUM CHLORIDE 1 G PO TABS
1.0000 g | ORAL_TABLET | Freq: Two times a day (BID) | ORAL | Status: DC
  Administered 2018-04-29 – 2018-04-30 (×2): 1 g via ORAL
  Filled 2018-04-29 (×3): qty 1

## 2018-04-29 NOTE — Consult Note (Signed)
Reason for Consult:  Chief Complaint  Patient presents with  . Post-op Problem   Referring Physician: Dr. Lysle Pearl  Reason for consult low sodium and generalized weakness  Jackie Allison is an 78 y.o. female.  With history of hypertension, hyperlipidemia, diabetes mellitus is admitted to surgery service for left breast abscess and had I&D done.  Hospitalist team is consulted for low sodium at 128.  Patient reports her sodium chronically runs low but could not recall any numbers.  Reporting generalized weakness.  Primary care physician  Suggested her  to take electrolyte solutions as per patient's report  Past Medical History:  Diagnosis Date  . Anemia    chronic microcytic anemia  . Cancer ([place]) 2019   left breast  . Diabetes mellitus without complication ([place])   . GERD (gastroesophageal reflux disease)   . Glaucoma   . Hypercholesterolemia   . Hypertension     Past Surgical History:  Procedure Laterality Date  . BACK SURGERY    . BREAST BIOPSY Right    stereo- neg  . BREAST BIOPSY Left 02/2018  . BREAST EXCISIONAL BIOPSY Left 04/03/2018   left breast cancer lumpectomy   . BREAST LUMPECTOMY Left 04/03/2018  . CATARACT EXTRACTION W/ INTRAOCULAR LENS  IMPLANT, BILATERAL    . EYE SURGERY Bilateral    cataract extraction  . INCISION AND DRAINAGE ABSCESS Left 04/28/2018   Procedure: INCISION AND DRAINAGE ABSCESS;  Surgeon: Benjamine Sprague, DO;  Location: [facility] ORS;  Service: General;  Laterality: Left;  . LUMBAR FUSION  2002?  . LUMBAR LAMINECTOMY  1997   titanium in back  . PARTIAL MASTECTOMY WITH AXILLARY SENTINEL LYMPH NODE BIOPSY Left 04/13/2018   Procedure: PARTIAL MASTECTOMY WITH AXILLARY SENTINEL LYMPH NODE BIOPSY  (No Needle Loc);  Surgeon: Benjamine Sprague, DO;  Location: [facility] ORS;  Service: General;  Laterality: Left;  . RETINAL DETACHMENT SURGERY Bilateral 2009  . TUBAL LIGATION      Family History  Problem Relation Age of Onset  . Diabetes Sister   . Diabetes Brother   .  Diabetes Sister   . Breast cancer Other   . Hypertension Mother   . Glaucoma Mother   . Heart attack Father     Social History:  reports that she has never smoked. She has never used smokeless tobacco. She reports that she does not drink alcohol or use drugs.  Allergies:  Allergies  Allergen Reactions  . Shellfish Allergy Anaphylaxis  . Iodine     Patient unsure if allergic to topical betadine Shellfish allergy with ingestion  . Sulfa Antibiotics Itching    Medications: I have reviewed the patient's current medications.  Results for orders placed or performed during the hospital encounter of 04/28/18 (from the past 48 hour(s))  CBC with Differential/Platelet     Status: Abnormal   Collection Time: 04/28/18  1:36 PM  Result Value Ref Range   WBC 4.5 4.0 - 10.5 K/uL   RBC 4.11 3.87 - 5.11 MIL/uL   Hemoglobin 9.7 (L) 12.0 - 15.0 g/dL   HCT 30.5 (L) 36.0 - 46.0 %   MCV 74.2 (L) 80.0 - 100.0 fL   MCH 23.6 (L) 26.0 - 34.0 pg   MCHC 31.8 30.0 - 36.0 g/dL   RDW 14.8 11.5 - 15.5 %   Platelets 310 150 - 400 K/uL   nRBC 0.0 0.0 - 0.2 %   Neutrophils Relative % 61 %   Neutro Abs 2.7 1.7 - 7.7 K/uL   Lymphocytes Relative 22 %  Lymphs Abs 1.0 0.7 - 4.0 K/uL   Monocytes Relative 13 %   Monocytes Absolute 0.6 0.1 - 1.0 K/uL   Eosinophils Relative 3 %   Eosinophils Absolute 0.1 0.0 - 0.5 K/uL   Basophils Relative 1 %   Basophils Absolute 0.0 0.0 - 0.1 K/uL   Immature Granulocytes 0 %   Abs Immature Granulocytes 0.02 0.00 - 0.07 K/uL    Comment: Performed at [facility], [address] [place], [place] [address]  Basic metabolic panel     Status: Abnormal   Collection Time: 04/28/18  1:36 PM  Result Value Ref Range   Sodium 128 (L) 135 - 145 mmol/L   Potassium 4.1 3.5 - 5.1 mmol/L   Chloride 99 98 - 111 mmol/L   CO2 21 (L) 22 - 32 mmol/L   Glucose, Bld 126 (H) 70 - 99 mg/dL   BUN 12 8 - 23 mg/dL   Creatinine, Ser 0.67 0.44 - 1.00 mg/dL   Calcium 9.0 8.9 - 10.3  mg/dL   GFR calc non Af Amer >60 >60 mL/min   GFR calc Af Amer >60 >60 mL/min   Anion gap 8 5 - 15    Comment: Performed at [facility], [address] [place], [place] [address]  Glucose, capillary     Status: Abnormal   Collection Time: 04/28/18  5:46 PM  Result Value Ref Range   Glucose-Capillary 121 (H) 70 - 99 mg/dL  Basic metabolic panel     Status: Abnormal   Collection Time: 04/29/18  5:28 AM  Result Value Ref Range   Sodium 128 (L) 135 - 145 mmol/L   Potassium 3.9 3.5 - 5.1 mmol/L   Chloride 98 98 - 111 mmol/L   CO2 22 22 - 32 mmol/L   Glucose, Bld 177 (H) 70 - 99 mg/dL   BUN 11 8 - 23 mg/dL   Creatinine, Ser 0.64 0.44 - 1.00 mg/dL   Calcium 8.8 (L) 8.9 - 10.3 mg/dL   GFR calc non Af Amer >60 >60 mL/min   GFR calc Af Amer >60 >60 mL/min   Anion gap 8 5 - 15    Comment: Performed at [facility], [address] [place], [place] [address]  Magnesium     Status: Abnormal   Collection Time: 04/29/18  5:28 AM  Result Value Ref Range   Magnesium 1.4 (L) 1.7 - 2.4 mg/dL    Comment: Performed at [facility], [address] [place], [place] [address]  Phosphorus     Status: None   Collection Time: 04/29/18  5:28 AM  Result Value Ref Range   Phosphorus 2.7 2.5 - 4.6 mg/dL    Comment: Performed at [facility], [address] [place], [place] [address]  CBC     Status: Abnormal   Collection Time: 04/29/18  5:28 AM  Result Value Ref Range   WBC 3.8 (L) 4.0 - 10.5 K/uL   RBC 4.01 3.87 - 5.11 MIL/uL   Hemoglobin 9.4 (L) 12.0 - 15.0 g/dL   HCT 29.0 (L) 36.0 - 46.0 %   MCV 72.3 (L) 80.0 - 100.0 fL   MCH 23.4 (L) 26.0 - 34.0 pg   MCHC 32.4 30.0 - 36.0 g/dL   RDW 14.6 11.5 - 15.5 %   Platelets 308 150 - 400 K/uL   nRBC 0.0 0.0 - 0.2 %    Comment: Performed at [facility], [address], [place], [place] [address]  Sodium, urine, random     Status: None   Collection Time: 04/29/18 11:46 AM  Result Value Ref Range    Sodium, Ur 67 mmol/L    Comment: Performed at [facility], [address] [place], [place] [address]  Osmolality, urine     Status: None   Collection Time: 04/29/18 11:46 AM  Result Value Ref Range   Osmolality, Ur 673 300 - 900 mOsm/kg    Comment: Performed at [facility], [address] [place],  [address]    No results found.  ROS:  CONSTITUTIONAL: Denies fevers, chills. Denies any fatigue, weakness.  EYES: Denies blurry vision, double vision, eye pain. EARS, NOSE, THROAT: Denies tinnitus, ear pain, hearing loss. RESPIRATORY: Denies cough, wheeze, shortness of breath.  CARDIOVASCULAR: Denies chest pain, palpitations, edema.  GASTROINTESTINAL: Denies nausea, vomiting, diarrhea, abdominal pain. Denies bright red blood per rectum. GENITOURINARY: Denies dysuria, hematuria. ENDOCRINE: Denies nocturia or thyroid problems. HEMATOLOGIC AND LYMPHATIC: Denies easy bruising or bleeding. SKIN: Denies rash or lesion. MUSCULOSKELETAL: Denies pain in neck, back, shoulder, knees, hips or arthritic symptoms.  NEUROLOGIC: Denies paralysis, paresthesias.  PSYCHIATRIC: Denies anxiety or depressive symptoms. Blood pressure 139/60, pulse 67, temperature (!) 97.5 F (36.4 C), temperature source Oral, resp. rate (!) 22, height 5\' 6"  (1.676 m), weight 73.9 kg, SpO2 99 %.   PHYSICAL EXAMINATION:  GENERAL: Well-nourished, well-developed , currently in no acute distress.  HEAD: Normocephalic, atraumatic.  EYES: Pupils equal, round, and reactive to light. Extraocular muscles intact. No scleral icterus.  MOUTH: Moist mucosal membranes. Dentition intact. No abscess noted. EARS, NOSE, THROAT: Clear without exudates. No external lesions.  NECK: Supple. No thyromegaly. No nodules. No JVD.  PULMONARY: Clear to auscultation bilaterally without wheezes, rales, or rhonchi. No use of accessory muscles. Good respiratory effort. CHEST: Nontender to palpation.  CARDIOVASCULAR: S1, S2,  regular rate and rhythm. No murmurs, rubs, or gallops.  GASTROINTESTINAL: Soft, nontender, nondistended. No masses. Positive bowel sounds. No hepatosplenomegaly. MUSCULOSKELETAL: No swelling, clubbing, edema. Range of motion full in all extremities. NEUROLOGIC: Cranial nerves II-XII intact. No gross focal neurological deficits. Sensation intact. Reflexes intact. SKIN: No ulcerations, lesions, rash, cyanosis. Skin warm, dry. Turgor intact. PSYCHIATRIC: Mood, affect within normal limits. Patient awake, alert, oriented x 3. Insight and judgment intact.   Assessment/Plan:  #Acute on chronic hyponatremia Patient is mentating fine, reports she has chronic hyponatremia Sodium at 128 Check TSH, urine osmolality and random urine sodium Patient is on IV fluids LR , if no improvement will consider changing IV fluids to normal saline  we will add sodium tabs Check BMP in a.m.  #Hypomagnesemia replete and recheck in a.m. potassium is in normal range  #Left breast abscess status post IND Continue IV antibiotics and care per attending physician Dr. Lysle Pearl  #Generalized weakness PT consult if okay with surgery  #Hyperlipidemia Check fasting lipid panel   TOTAL TIME TAKING CARE OF THIS PATIENT: 41  minutes.   Note: This dictation was prepared with Dragon dictation along with smaller phrase technology. Any transcriptional errors that result from this process are unintentional.   @[facility] @ Pager - [phone] 04/29/2018, 3:29 PM

## 2018-04-29 NOTE — Consult Note (Signed)
[facility] Nurse wound consult note Reason for Consult: Place NPWT device to breast wound on Friday 04/30/18 for M/W/F changes.   Wound type: Surgical, infectious Pressure Injury POA: N/A  Supplies ordered to room for dressing placement on Friday am after discussion with Dr. Lysle Pearl.  Thank you for inviting [place] to participate in the care of this patient.  [place] nursing team will see in the am, and will remain available to this patient, the nursing, surgical and medical teams. Thanks, Maudie Flakes, MSN, RN, [place], Arther Abbott  Pager# [phone]

## 2018-04-29 NOTE — Progress Notes (Signed)
Subjective:  CC:  Jackie Allison is a 78 y.o. female  Hospital stay day 1, 1 Day Post-Op left breast abscess I&D  HPI: No acute issues.  Still complains of discomfort in the area.  ROS:  General: Denies weight loss, weight gain, fatigue, fevers, chills, and night sweats. Heart: Denies chest pain, palpitations, racing heart, irregular heartbeat, leg pain or swelling, and decreased activity tolerance. Respiratory: Denies breathing difficulty, shortness of breath, wheezing, cough, and sputum. GI: Denies change in appetite, heartburn, nausea, vomiting, constipation, diarrhea, and blood in stool. GU: Denies difficulty urinating, pain with urinating, urgency, frequency, blood in urine.   Objective:      Temp:  [97 F (36.1 C)-98.6 F (37 C)] 97.5 F (36.4 C) (01/02 1334) Pulse Rate:  [67-87] 67 (01/02 1334) Resp:  [13-22] 22 (01/02 1334) BP: (125-193)/(55-83) 139/60 (01/02 1334) SpO2:  [93 %-100 %] 99 % (01/02 1334) Weight:  [73.9 kg] 73.9 kg (01/01 1629)     Height: 5\' 6"  (167.6 cm) Weight: 73.9 kg BMI (Calculated): 26.31   Intake/Output this shift:   Intake/Output Summary (Last 24 hours) at 04/29/2018 1441 Last data filed at 04/29/2018 1146 Gross per 24 hour  Intake 1124.85 ml  Output 1725 ml  Net -600.15 ml     Constitutional :  alert, cooperative, appears stated age and no distress  Skin: Cool and moist. Wound dressing intact with some discharge noted.  Tenderness still present in area but no increasing erythema or induration.  Psychiatric: Normal affect, non-agitated, not confused       LABS:  CMP Latest Ref Rng & Units 04/29/2018 04/28/2018 04/24/2018  Glucose 70 - 99 mg/dL 177(H) 126(H) 144(H)  BUN 8 - 23 mg/dL 11 12 18   Creatinine 0.44 - 1.00 mg/dL 0.64 0.67 0.80  Sodium 135 - 145 mmol/L 128(L) 128(L) 128(L)  Potassium 3.5 - 5.1 mmol/L 3.9 4.1 3.3(L)  Chloride 98 - 111 mmol/L 98 99 97(L)  CO2 22 - 32 mmol/L 22 21(L) 24  Calcium 8.9 - 10.3 mg/dL 8.8(L) 9.0 9.3  Total  Protein 6.5 - 8.1 g/dL - - 7.4  Total Bilirubin 0.3 - 1.2 mg/dL - - 0.6  Alkaline Phos 38 - 126 U/L - - 52  AST 15 - 41 U/L - - 13(L)  ALT 0 - 44 U/L - - 14   CBC Latest Ref Rng & Units 04/29/2018 04/28/2018 04/24/2018  WBC 4.0 - 10.5 K/uL 3.8(L) 4.5 9.0  Hemoglobin 12.0 - 15.0 g/dL 9.4(L) 9.7(L) 10.1(L)  Hematocrit 36.0 - 46.0 % 29.0(L) 30.5(L) 31.1(L)  Platelets 150 - 400 K/uL 308 310 300    RADS: n/a Assessment:   Left breast abscess I&D.  Discussed case with wound care nurse and will place wound vac to facilitate wound healing tomorrow.  Continue current management until then.  Low Na levels noted.  Pt states it is chronic but does not endorse any specific interventions.  Asked for hospitalist consult to review and make sure no specific intervention is needed during hospital stay.

## 2018-04-30 LAB — CBC
HCT: 26.5 % — ABNORMAL LOW (ref 36.0–46.0)
Hemoglobin: 8.5 g/dL — ABNORMAL LOW (ref 12.0–15.0)
MCH: 23.5 pg — ABNORMAL LOW (ref 26.0–34.0)
MCHC: 32.1 g/dL (ref 30.0–36.0)
MCV: 73.2 fL — ABNORMAL LOW (ref 80.0–100.0)
Platelets: 293 10*3/uL (ref 150–400)
RBC: 3.62 MIL/uL — ABNORMAL LOW (ref 3.87–5.11)
RDW: 14.7 % (ref 11.5–15.5)
WBC: 5.8 10*3/uL (ref 4.0–10.5)
nRBC: 0 % (ref 0.0–0.2)

## 2018-04-30 LAB — LIPID PANEL
Cholesterol: 128 mg/dL (ref 0–200)
HDL: 37 mg/dL — ABNORMAL LOW (ref >40)
LDL Cholesterol: 66 mg/dL (ref 0–99)
Total CHOL/HDL Ratio: 3.5 RATIO
Triglycerides: 126 mg/dL (ref <150)
VLDL: 25 mg/dL (ref 0–40)

## 2018-04-30 LAB — BASIC METABOLIC PANEL
Anion gap: 9 (ref 5–15)
BUN: 12 mg/dL (ref 8–23)
CO2: 21 mmol/L — ABNORMAL LOW (ref 22–32)
Calcium: 8.8 mg/dL — ABNORMAL LOW (ref 8.9–10.3)
Chloride: 99 mmol/L (ref 98–111)
Creatinine, Ser: 0.76 mg/dL (ref 0.44–1.00)
GFR calc Af Amer: >60 mL/min (ref >60)
GFR calc non Af Amer: >60 mL/min (ref >60)
Glucose, Bld: 158 mg/dL — ABNORMAL HIGH (ref 70–99)
Potassium: 3.7 mmol/L (ref 3.5–5.1)
Sodium: 129 mmol/L — ABNORMAL LOW (ref 135–145)

## 2018-04-30 LAB — MAGNESIUM: Magnesium: 1.5 mg/dL — ABNORMAL LOW (ref 1.7–2.4)

## 2018-04-30 LAB — PHOSPHORUS: Phosphorus: 2.7 mg/dL (ref 2.5–4.6)

## 2018-04-30 LAB — TSH: TSH: 2.194 u[IU]/mL (ref 0.350–4.500)

## 2018-04-30 MED ORDER — IBUPROFEN 400 MG PO TABS
600.0000 mg | ORAL_TABLET | Freq: Three times a day (TID) | ORAL | Status: AC
  Administered 2018-04-30 – 2018-05-02 (×8): 600 mg via ORAL
  Filled 2018-04-30 (×8): qty 2
  Filled 2018-04-30: qty 1

## 2018-04-30 MED ORDER — POLYETHYLENE GLYCOL 3350 17 G PO PACK
17.0000 g | PACK | Freq: Every day | ORAL | Status: DC
  Administered 2018-04-30 – 2018-05-02 (×3): 17 g via ORAL
  Filled 2018-04-30 (×4): qty 1

## 2018-04-30 MED ORDER — IBUPROFEN 400 MG PO TABS: 600.0000 mg | ORAL_TABLET | Freq: Four times a day (QID) | ORAL | Status: DC | PRN

## 2018-04-30 MED ORDER — ACETAMINOPHEN 325 MG PO TABS: 650.0000 mg | ORAL_TABLET | Freq: Four times a day (QID) | ORAL | Status: DC | PRN

## 2018-04-30 MED ORDER — SODIUM CHLORIDE 1 G PO TABS
1.0000 g | ORAL_TABLET | Freq: Three times a day (TID) | ORAL | Status: AC
  Administered 2018-04-30 – 2018-05-03 (×9): 1 g via ORAL
  Filled 2018-04-30 (×9): qty 1

## 2018-04-30 MED ORDER — TRAMADOL HCL 50 MG PO TABS: 50.0000 mg | ORAL_TABLET | Freq: Four times a day (QID) | ORAL | Status: DC | PRN

## 2018-04-30 MED ORDER — HYDROCODONE-ACETAMINOPHEN 5-325 MG PO TABS
1.0000 | ORAL_TABLET | ORAL | Status: DC | PRN
  Administered 2018-04-30 – 2018-05-03 (×4): 1 via ORAL
  Filled 2018-04-30 (×6): qty 1

## 2018-04-30 MED ORDER — MAGNESIUM SULFATE 4 GM/100ML IV SOLN
4.0000 g | Freq: Once | INTRAVENOUS | Status: AC
  Administered 2018-04-30: 4 g via INTRAVENOUS
  Filled 2018-04-30: qty 100

## 2018-04-30 NOTE — Evaluation (Signed)
Physical Therapy Evaluation Patient Details Name: Jackie Allison MRN: [id number] DOB: 02-04-1941 Today's Date: 04/30/2018   History of Present Illness  Pt is a 78 y.o. female s/p I&D L breast abscess 04/28/18.  Pt noted with acute on chronic hyponatremia.  Pt s/p lumpectomy 04/03/18 and s/p partial mastectomy 04/13/18 secondary L breast CA.  PMH also includes anemia, L breast CA, DM, htn, lymphedema.  Clinical Impression  Prior to hospital admission, pt was independent with ambulation (occasional use of SPC).  Pt lives at [facility] independent living (no steps to enter home).  Currently pt is SBA with transfers and CGA progressing to SBA with ambulation around nursing loop (no UE support).  Pt reporting 4-5/10 pain L breast wound area with ambulation but 2/10 resting in bed end of session.  Pt would benefit from skilled PT to address noted impairments and functional limitations during hospitalization (see below for any additional details).  Upon hospital discharge, no further PT needs anticipated.    Follow Up Recommendations No PT follow up    Equipment Recommendations  None recommended by PT    Recommendations for Other Services       Precautions / Restrictions Precautions Precautions: Fall Restrictions Weight Bearing Restrictions: No      Mobility  Bed Mobility Overal bed mobility: Modified Independent             General bed mobility comments: Sit to semi-supine (HOB elevated) with mild increased effort and slower movements to prevent "pulling" sensation to L breast wound area.  Transfers Overall transfer level: Needs assistance Equipment used: None Transfers: Sit to/from Stand Sit to Stand: Supervision         General transfer comment: mild increased effort to stand with single UE support on bed for support  Ambulation/Gait Ambulation/Gait assistance: Min guard;Supervision Gait Distance (Feet): 340 Feet Assistive device: None   Gait velocity: mildly decreased    General Gait Details: initial decreased B step length and mild increased lateral sway but improve steadiness and step length noted with increased distance ambulating  Stairs            Wheelchair Mobility    Modified Rankin (Stroke Patients Only)       Balance Overall balance assessment: Needs assistance Sitting-balance support: No upper extremity supported;Feet supported Sitting balance-Leahy Scale: Normal Sitting balance - Comments: steady sitting reaching outside BOS with R UE    Standing balance support: No upper extremity supported;During functional activity Standing balance-Leahy Scale: Good Standing balance comment: no loss of balance ambulation                             Pertinent Vitals/Pain Pain Assessment: 0-10 Pain Score: 2  Pain Location: L breast wound Pain Descriptors / Indicators: Tender;Sore Pain Intervention(s): Limited activity within patient's tolerance;Monitored during session;Premedicated before session;Repositioned  Vitals (HR and O2 on room air) stable and WFL throughout treatment session.    Home Living Family/patient expects to be discharged to:: Private residence Living Arrangements: Spouse/significant other Available Help at Discharge: Family Type of Home: House Home Access: Level entry     [place]: One [place]: Environmental consultant - 4 wheels;Cane - single point      Prior Function Level of Independence: Independent         Comments: Pt occasionally will use SPC.  Husband assists with cleaning.     Hand Dominance        Extremity/Trunk Assessment   Upper  Extremity Assessment Upper Extremity Assessment: Generalized weakness    Lower Extremity Assessment Lower Extremity Assessment: Generalized weakness    Cervical / Trunk Assessment Cervical / Trunk Assessment: Normal  Communication   Communication: No difficulties  Cognition Arousal/Alertness: Awake/alert Behavior During Therapy: WFL for tasks  assessed/performed Overall Cognitive Status: Within Functional Limits for tasks assessed                                        General Comments   Nursing cleared pt for participation in physical therapy.  Pt agreeable to PT session.    Exercises  Encouraged pt to ambulate 3x's a day around unit nursing loop with staff assist.   Assessment/Plan    PT Assessment Patient needs continued PT services  PT Problem List Decreased strength;Decreased balance;Decreased mobility;Pain       PT Treatment Interventions DME instruction;Gait training;Functional mobility training;Therapeutic activities;Therapeutic exercise;Balance training;Patient/family education    PT Goals (Current goals can be found in the Care Plan section)  Acute Rehab PT Goals Patient Stated Goal: to go home PT Goal Formulation: With patient Time For Goal Achievement: 05/14/18 Potential to Achieve Goals: Good    Frequency Min 2X/week   Barriers to discharge        Co-evaluation               AM-PAC PT "6 Clicks" Mobility  Outcome Measure Help needed turning from your back to your side while in a flat bed without using bedrails?: None Help needed moving from lying on your back to sitting on the side of a flat bed without using bedrails?: A Little Help needed moving to and from a bed to a chair (including a wheelchair)?: A Little Help needed standing up from a chair using your arms (e.g., wheelchair or bedside chair)?: A Little Help needed to walk in hospital room?: A Little Help needed climbing 3-5 steps with a railing? : A Little 6 Click Score: 19    End of Session Equipment Utilized During Treatment: Gait belt(positioned low away from wound) Activity Tolerance: Patient tolerated treatment well Patient left: in bed;with call bell/phone within reach;with bed alarm set Nurse Communication: Mobility status;Precautions PT Visit Diagnosis: Unsteadiness on feet (R26.81);Muscle weakness  (generalized) (M62.81)    Time: 0623-7628 PT Time Calculation (min) (ACUTE ONLY): 25 min   Charges:   PT Evaluation $PT Eval Low Complexity: 1 Low         [place], PT 04/30/18, 3:54 PM [phone]

## 2018-04-30 NOTE — Progress Notes (Addendum)
Subjective:  CC:  Jackie Allison is a 78 y.o. female  Hospital stay day 1, 2 Days Post-Op left breast abscess I&D  HPI: No acute issues.  Still complains of discomfort in the area.  Denied wound vac placement today.  ROS:  General: Denies weight loss, weight gain, fatigue, fevers, chills, and night sweats. Heart: Denies chest pain, palpitations, racing heart, irregular heartbeat, leg pain or swelling, and decreased activity tolerance. Respiratory: Denies breathing difficulty, shortness of breath, wheezing, cough, and sputum. GI: Denies change in appetite, heartburn, nausea, vomiting, constipation, diarrhea, and blood in stool. GU: Denies difficulty urinating, pain with urinating, urgency, frequency, blood in urine.   Objective:      Temp:  [97.7 F (36.5 C)-98.3 F (36.8 C)] 97.8 F (36.6 C) (01/03 1144) Pulse Rate:  [65-73] 73 (01/03 1144) Resp:  [16] 16 (01/03 1144) BP: (98-187)/(49-73) 128/59 (01/03 1144) SpO2:  [98 %-99 %] 98 % (01/03 1144)     Height: 5\' 6"  (167.6 cm) Weight: 73.9 kg BMI (Calculated): 26.31   Intake/Output this shift:   Intake/Output Summary (Last 24 hours) at 04/30/2018 1518 Last data filed at 04/30/2018 1351 Gross per 24 hour  Intake 2412.46 ml  Output 1600 ml  Net 812.46 ml     Constitutional :  alert, cooperative, appears stated age and no distress  Skin: Cool and moist. Wound dressing intact with some discharge noted.  Tenderness still present in area but no increasing erythema or induration.  Psychiatric: Normal affect, non-agitated, not confused       LABS:  CMP Latest Ref Rng & Units 04/30/2018 04/29/2018 04/28/2018  Glucose 70 - 99 mg/dL 158(H) 177(H) 126(H)  BUN 8 - 23 mg/dL 12 11 12   Creatinine 0.44 - 1.00 mg/dL 0.76 0.64 0.67  Sodium 135 - 145 mmol/L 129(L) 128(L) 128(L)  Potassium 3.5 - 5.1 mmol/L 3.7 3.9 4.1  Chloride 98 - 111 mmol/L 99 98 99  CO2 22 - 32 mmol/L 21(L) 22 21(L)  Calcium 8.9 - 10.3 mg/dL 8.8(L) 8.8(L) 9.0  Total Protein 6.5  - 8.1 g/dL - - -  Total Bilirubin 0.3 - 1.2 mg/dL - - -  Alkaline Phos 38 - 126 U/L - - -  AST 15 - 41 U/L - - -  ALT 0 - 44 U/L - - -   CBC Latest Ref Rng & Units 04/30/2018 04/29/2018 04/28/2018  WBC 4.0 - 10.5 K/uL 5.8 3.8(L) 4.5  Hemoglobin 12.0 - 15.0 g/dL 8.5(L) 9.4(L) 9.7(L)  Hematocrit 36.0 - 46.0 % 26.5(L) 29.0(L) 30.5(L)  Platelets 150 - 400 K/uL 293 308 310    RADS: n/a Assessment:   Left breast abscess I&D.   Discussed risk benefits alternatives to wound VAC therapy.  Risks including theoretical proliferation of breast cancer from microscopic cells that may remain in the area.  Although this is a very unlikely risk given that the margins were negative on the latest pathology report.  Cost and the inconvenience of caring around the pump also discussed.  Benefits of wound therapy versus wet-to-dry dressing changes include less frequent dressing changes and a quicker wound healing.  Alternatives include continuing with saline dressing changes.  After extensive discussion as noted above, main reason for denying wound VAC therapy at this time is that she has not had enough time to read up on the therapy itself.  She stated that if needed she has family members that can help with the twice daily dressing changes that is recommended at this time I did  reiterate the less frequent need for wound changes and the overall shorten duration of wound care therapy that is likely to be achieved with the wound VAC compared to the wet-to-dry dressing changes, but patient still refused the therapy.  At this time we will continue with twice daily dressing changes until she would like to reconsider wound VAC therapy.  Low Na levels noted. hospitalist recs appreciated.  Will f/u with any additional recs.    Likely d/c in the next couple days.  We will also monitor her bowel movements since she has is complaining of constipation.  Mira lax added to current regimen, Celebrex Tylenol in place of Ultram and  morphine injections as well

## 2018-04-30 NOTE — Progress Notes (Signed)
[place] [facility] [place] NAME: Jackie Allison    MR#:  [id number]  DATE OF BIRTH:  June 20, 1940  SUBJECTIVE:  CHIEF COMPLAINT: Patient is resting comfortably denies any complaints REVIEW OF SYSTEMS:  CONSTITUTIONAL: No fever, fatigue or weakness.  EYES: No blurred or double vision.  EARS, NOSE, AND THROAT: No tinnitus or ear pain.  RESPIRATORY: No cough, shortness of breath, wheezing or hemoptysis.  CARDIOVASCULAR: No chest pain, orthopnea, edema.  Left breast  area is painful GASTROINTESTINAL: No nausea, vomiting, diarrhea or abdominal pain.  GENITOURINARY: No dysuria, hematuria.  ENDOCRINE: No polyuria, nocturia,  HEMATOLOGY: No anemia, easy bruising or bleeding SKIN: No rash or lesion. MUSCULOSKELETAL: No joint pain or arthritis.   NEUROLOGIC: No tingling, numbness, weakness.  PSYCHIATRY: No anxiety or depression.   DRUG ALLERGIES:   Allergies  Allergen Reactions  . Shellfish Allergy Anaphylaxis  . Iodine     Patient unsure if allergic to topical betadine Shellfish allergy with ingestion  . Sulfa Antibiotics Itching    VITALS:  Blood pressure (!) 128/59, pulse 73, temperature 97.8 F (36.6 C), temperature source Oral, resp. rate 16, height 5\' 6"  (1.676 m), weight 73.9 kg, SpO2 98 %.  PHYSICAL EXAMINATION:  GENERAL:  78 y.o.-year-old patient lying in the bed with no acute distress.  EYES: Pupils equal, round, reactive to light and accommodation. No scleral icterus. Extraocular muscles intact.  HEENT: Head atraumatic, normocephalic. Oropharynx and nasopharynx clear.  NECK:  Supple, no jugular venous distention. No thyroid enlargement, no tenderness.  LUNGS: Normal breath sounds bilaterally, no wheezing, rales,rhonchi or crepitation. No use of accessory muscles of respiration.  CARDIOVASCULAR: S1, S2 normal. No murmurs, rubs, or gallops.  Left-sided anterior chest wall breast dressing is intact with some tenderness ABDOMEN: Soft,  nontender, nondistended. Bowel sounds present.   EXTREMITIES: No pedal edema, cyanosis, or clubbing.  NEUROLOGIC: Awake, alert and oriented x3 sensation intact. Gait not checked.  PSYCHIATRIC: The patient is alert and oriented x 3.  SKIN: No obvious rash, lesion, or ulcer.    LABORATORY PANEL:   CBC Recent Labs  Lab 04/30/18 1041  WBC 5.8  HGB 8.5*  HCT 26.5*  PLT 293   ------------------------------------------------------------------------------------------------------------------  Chemistries  Recent Labs  Lab 04/24/18 2213  04/30/18 1041  NA 128*   < > 129*  K 3.3*   < > 3.7  CL 97*   < > 99  CO2 24   < > 21*  GLUCOSE 144*   < > 158*  BUN 18   < > 12  CREATININE 0.80   < > 0.76  CALCIUM 9.3   < > 8.8*  MG  --    < > 1.5*  AST 13*  --   --   ALT 14  --   --   ALKPHOS 52  --   --   BILITOT 0.6  --   --    < > = values in this interval not displayed.   ------------------------------------------------------------------------------------------------------------------  Cardiac Enzymes No results for input(s): TROPONINI in the last 168 hours. ------------------------------------------------------------------------------------------------------------------  RADIOLOGY:  No results found.  EKG:   Orders placed or performed during the hospital encounter of 10/11/17  . ED EKG  . ED EKG  . EKG    ASSESSMENT AND PLAN:   #Acute on chronic hyponatremia Patient is mentating fine, reports she has chronic hyponatremia Sodium at 128-129 Normal TSH  urine osmolality 673 and random urine sodium 67 Discontinued LR  sodium tabs Check BMP in a.m.  #Hypomagnesemia replete and recheck in a.m. potassium is in normal range Magnesium at 1.5  #Left breast abscess status post IND Continue IV antibiotics and care per attending physician Dr. Lysle Pearl  #Generalized weakness PT consult if okay with surgery  #Hyperlipidemia  fasting lipid panel-triglycerides 126, LDL 66  and HDL 37   Will sign off.  Please feel free to call us with any questions  All the records are reviewed and case discussed with Care Management/Social Workerr. Management plans discussed with the patient, family and they are in agreement.   TOTAL TIME TAKING CARE OF THIS PATIENT: 33  minutes.   .  Note: This dictation was prepared with Dragon dictation along with smaller phrase technology. Any transcriptional errors that result from this process are unintentional.   Nicholes Mango M.D on 04/30/2018 at 3:32 PM  Between 7am to 6pm - Pager - [phone] After 6pm go to [email or web address] - password [email or web address] [facility]  Office  [phone]  CC: Primary care physician; Dion Body, MD

## 2018-04-30 NOTE — Consult Note (Signed)
[place] Nurse wound consult note Reason for Consult: Patient visit today for placement of NPWT with Bedside RN Elita Quick and patient is refusing [employer]. brand therapy. She said she read about the product last night and has discussed it with her family. She states she does not want to "rip off the insurance company" and that her "wound would heal with out it". She also states that her sister "had a large wound and she healed without it".  She understands that her radiation therapy will not commence until after wound healing. She reports that she would like to continue to twice daily saline dressings to the wound. She is made aware that a nursing agency [facility]) would not be able to come twice daily for wound care, or even once daily.  She states that "they would figure it out at [facility]", that "her sister is a Marine scientist and she can help her" and that if needed, she "could learn". She states she does not want to "drag that machine around with her". She is taught that the home unit is much smaller than the unit here at the hospital and she is unwavering (currently) in her opinion.  Lum Babe (bedside RN) has notified Dr. Lysle Pearl and he states that he will be by later today to talk with the patient.  The supplies are left unopened in the room.  Bedside RNs would be able to place the NPWT dressing should the patient agree to placement.  [place] nursing team will follow again on Monday if patient is in house, and will remain available to this patient, the nursing and medical teams.    Thanks, Maudie Flakes, MSN, RN, [place], [facility]  Pager# [phone]  Time spent with the patient is 45 minutes.

## 2018-05-01 LAB — MAGNESIUM: Magnesium: 1.7 mg/dL (ref 1.7–2.4)

## 2018-05-01 LAB — CBC
HCT: 26.3 % — ABNORMAL LOW (ref 36.0–46.0)
Hemoglobin: 8.5 g/dL — ABNORMAL LOW (ref 12.0–15.0)
MCH: 23.8 pg — ABNORMAL LOW (ref 26.0–34.0)
MCHC: 32.3 g/dL (ref 30.0–36.0)
MCV: 73.7 fL — ABNORMAL LOW (ref 80.0–100.0)
Platelets: 296 10*3/uL (ref 150–400)
RBC: 3.57 MIL/uL — ABNORMAL LOW (ref 3.87–5.11)
RDW: 14.9 % (ref 11.5–15.5)
WBC: 5.1 10*3/uL (ref 4.0–10.5)
nRBC: 0 % (ref 0.0–0.2)

## 2018-05-01 LAB — BASIC METABOLIC PANEL
Anion gap: 6 (ref 5–15)
BUN: 16 mg/dL (ref 8–23)
CO2: 23 mmol/L (ref 22–32)
Calcium: 8.4 mg/dL — ABNORMAL LOW (ref 8.9–10.3)
Chloride: 101 mmol/L (ref 98–111)
Creatinine, Ser: 0.79 mg/dL (ref 0.44–1.00)
GFR calc Af Amer: >60 mL/min (ref >60)
GFR calc non Af Amer: >60 mL/min (ref >60)
Glucose, Bld: 153 mg/dL — ABNORMAL HIGH (ref 70–99)
Potassium: 3.9 mmol/L (ref 3.5–5.1)
Sodium: 130 mmol/L — ABNORMAL LOW (ref 135–145)

## 2018-05-01 LAB — PHOSPHORUS: Phosphorus: 2.8 mg/dL (ref 2.5–4.6)

## 2018-05-01 NOTE — Progress Notes (Signed)
Subjective:  CC:  Jackie Allison is a 78 y.o. female  Hospital stay day 1, 3 Days Post-Op left breast abscess I&D  HPI: No acute issues.  Still complains of discomfort in the area.    ROS:  General: Denies weight loss, weight gain, fatigue, fevers, chills, and night sweats. Heart: Denies chest pain, palpitations, racing heart, irregular heartbeat, leg pain or swelling, and decreased activity tolerance. Respiratory: Denies breathing difficulty, shortness of breath, wheezing, cough, and sputum. GI: Denies change in appetite, heartburn, nausea, vomiting, constipation, diarrhea, and blood in stool. GU: Denies difficulty urinating, pain with urinating, urgency, frequency, blood in urine.   Objective:      Temp:  [97.8 F (36.6 C)-98.6 F (37 C)] 98.4 F (36.9 C) (01/04 0454) Pulse Rate:  [64-73] 64 (01/04 0505) Resp:  [16-20] 20 (01/04 0454) BP: (98-186)/(49-78) 186/78 (01/04 0505) SpO2:  [98 %] 98 % (01/04 0454)     Height: 5\' 6"  (167.6 cm) Weight: 73.9 kg BMI (Calculated): 26.31   Intake/Output this shift:   Intake/Output Summary (Last 24 hours) at 05/01/2018 0902 Last data filed at 05/01/2018 0732 Gross per 24 hour  Intake 457 ml  Output 3100 ml  Net -2643 ml     Constitutional :  alert, cooperative, appears stated age and no distress  Skin: Cool and moist. Wound dressing intact with some discharge noted.  Tenderness still present in area but no increasing erythema or induration.  Psychiatric: Normal affect, non-agitated, not confused       LABS:  CMP Latest Ref Rng & Units 05/01/2018 04/30/2018 04/29/2018  Glucose 70 - 99 mg/dL 153(H) 158(H) 177(H)  BUN 8 - 23 mg/dL 16 12 11   Creatinine 0.44 - 1.00 mg/dL 0.79 0.76 0.64  Sodium 135 - 145 mmol/L 130(L) 129(L) 128(L)  Potassium 3.5 - 5.1 mmol/L 3.9 3.7 3.9  Chloride 98 - 111 mmol/L 101 99 98  CO2 22 - 32 mmol/L 23 21(L) 22  Calcium 8.9 - 10.3 mg/dL 8.4(L) 8.8(L) 8.8(L)  Total Protein 6.5 - 8.1 g/dL - - -  Total Bilirubin 0.3 -  1.2 mg/dL - - -  Alkaline Phos 38 - 126 U/L - - -  AST 15 - 41 U/L - - -  ALT 0 - 44 U/L - - -   CBC Latest Ref Rng & Units 05/01/2018 04/30/2018 04/29/2018  WBC 4.0 - 10.5 K/uL 5.1 5.8 3.8(L)  Hemoglobin 12.0 - 15.0 g/dL 8.5(L) 8.5(L) 9.4(L)  Hematocrit 36.0 - 46.0 % 26.3(L) 26.5(L) 29.0(L)  Platelets 150 - 400 K/uL 296 293 308    RADS: n/a Assessment:   Left breast abscess I&D.   Pt still hesitant on wound vac placement despite reiterating that the dressing changes will only have to be performed every 3-5days and that the chance of malfunction and leak issues will be far less likely and less labor intensive than the current BID wet-to-dry dressing changes.  She requested if there was a way [facility] can supplement the already arranged home health dressing needs.  She is already scheduled for MWF dressing changes.  She mentioned today that she may not be able to ask for family members to help with dressing changes as she initially thought yesterday.  I also again briefly explained r/b/a to wound vac therapy, including how she will be mobile and only have to carry around a small vac with her.  I've placed case management consult to see if supplemental home health is a feasible option for her, but I  did state I have never heard of such arrangements and that I do not have high hopes of being able to set up such an arrangement.  I asked husband who was at bedside to be sure to observe the next dressing change so that he can provide the care at home in case.  Both pt and husband verbalized understanding.  Once outpt management is finalized, pt is ok to d/c.  F/u outpt for her chronic hyponatremia.

## 2018-05-02 LAB — BASIC METABOLIC PANEL
ANION GAP: 8 (ref 5–15)
BUN: 14 mg/dL (ref 8–23)
CO2: 22 mmol/L (ref 22–32)
Calcium: 8.7 mg/dL — ABNORMAL LOW (ref 8.9–10.3)
Chloride: 99 mmol/L (ref 98–111)
Creatinine, Ser: 0.74 mg/dL (ref 0.44–1.00)
GFR calc Af Amer: >60 mL/min (ref >60)
GFR calc non Af Amer: >60 mL/min (ref >60)
GLUCOSE: 126 mg/dL — AB (ref 70–99)
Potassium: 3.9 mmol/L (ref 3.5–5.1)
Sodium: 129 mmol/L — ABNORMAL LOW (ref 135–145)

## 2018-05-02 LAB — PHOSPHORUS: Phosphorus: 3.2 mg/dL (ref 2.5–4.6)

## 2018-05-02 LAB — CBC
HEMATOCRIT: 27.2 % — AB (ref 36.0–46.0)
Hemoglobin: 9.1 g/dL — ABNORMAL LOW (ref 12.0–15.0)
MCH: 24.4 pg — ABNORMAL LOW (ref 26.0–34.0)
MCHC: 33.5 g/dL (ref 30.0–36.0)
MCV: 72.9 fL — ABNORMAL LOW (ref 80.0–100.0)
Platelets: 329 10*3/uL (ref 150–400)
RBC: 3.73 MIL/uL — ABNORMAL LOW (ref 3.87–5.11)
RDW: 15 % (ref 11.5–15.5)
WBC: 5 10*3/uL (ref 4.0–10.5)
nRBC: 0 % (ref 0.0–0.2)

## 2018-05-02 LAB — MAGNESIUM: Magnesium: 1.6 mg/dL — ABNORMAL LOW (ref 1.7–2.4)

## 2018-05-02 MED ORDER — AMOXICILLIN-POT CLAVULANATE 875-125 MG PO TABS
1.0000 | ORAL_TABLET | Freq: Two times a day (BID) | ORAL | Status: DC
  Administered 2018-05-02 – 2018-05-03 (×3): 1 via ORAL
  Filled 2018-05-02 (×3): qty 1

## 2018-05-02 MED ORDER — HYDRALAZINE HCL 50 MG PO TABS
100.0000 mg | ORAL_TABLET | Freq: Three times a day (TID) | ORAL | Status: DC
  Administered 2018-05-02 – 2018-05-03 (×4): 100 mg via ORAL
  Filled 2018-05-02 (×4): qty 2

## 2018-05-02 NOTE — Plan of Care (Signed)
Wound dressing changed this AM. Possible D/C tomorrow with wound vac. The patient has been stable. BP meds provided.  Problem: Education: Goal: Required Educational Video(s) Outcome: Progressing   Problem: Clinical Measurements: Goal: Postoperative complications will be avoided or minimized Outcome: Progressing   Problem: Skin Integrity: Goal: Demonstration of wound healing without infection will improve Outcome: Progressing   Problem: Education: Goal: Knowledge of General Education information will improve Description Including pain rating scale, medication(s)/side effects and non-pharmacologic comfort measures Outcome: Progressing   Problem: Health Behavior/Discharge Planning: Goal: Ability to manage health-related needs will improve Outcome: Progressing   Problem: Clinical Measurements: Goal: Ability to maintain clinical measurements within normal limits will improve Outcome: Progressing Goal: Will remain free from infection Outcome: Progressing Goal: Diagnostic test results will improve Outcome: Progressing Goal: Respiratory complications will improve Outcome: Progressing Goal: Cardiovascular complication will be avoided Outcome: Progressing   Problem: Activity: Goal: Risk for activity intolerance will decrease Outcome: Progressing   Problem: Nutrition: Goal: Adequate nutrition will be maintained Outcome: Progressing   Problem: Coping: Goal: Level of anxiety will decrease Outcome: Progressing   Problem: Elimination: Goal: Will not experience complications related to bowel motility Outcome: Progressing Goal: Will not experience complications related to urinary retention Outcome: Progressing   Problem: Pain Managment: Goal: General experience of comfort will improve Outcome: Progressing   Problem: Safety: Goal: Ability to remain free from injury will improve Outcome: Progressing   Problem: Skin Integrity: Goal: Risk for impaired skin integrity will  decrease Outcome: Progressing

## 2018-05-02 NOTE — Care Management Note (Addendum)
Case Management Note  Patient Details  Name: Jackie Allison MRN: [id number] Date of Birth: 1940-08-09  Subjective/Objective:   RNCM team consulted on patient regarding transition of care planning. Patient lives at home with her spouse and is independent with activities of daily living. Has a walker at home but does not require it for mobility. Patient had partial mastectomy on Dec 17th for breat cancer. Unfortunately, this would has not health properly and has required hospitalization for maintainence of the wound. Per the patient she had two [facility] to assist with dressing changes but the site began bleeding and the patient came to the ED. It has since been determined the patient will require a wound vac. Due to the maintainence of this and miscommunications with the [place] nurse the patient declined and requested home health with wet to dry dressing changes. Dressing changes ordered daily and the patient/spouse did not feel comfortable with doing so unless someone could come into the home daily seven days a week to assist. When they were told this was not feasible through home health they then became agreeable to a wound vac. There are plans to place [facility] [place] today 1/5. RNCM has began workup with [facility] per patient preference and will fill out necessary forms once wound size, vac settings, etc have been established. Brad from [facility] care aware of referral for nursing care as well as DME wound vac.     *Update 46- Spoke with Dr Lysle Pearl and plan is for Dr Lysle Pearl to sign forms for wound vac and plans for [facility] to bring vac tomorrow with the understanding that the RN will apply the vac and patient will discharge.           Action/Plan:   Expected Discharge Date:  04/29/18               Expected Discharge Plan:  [place]  In-House Referral:     Discharge planning Services  CM Consult  Post Acute Care Choice:  Durable Medical Equipment, Home  Health Choice offered to:  Patient, Spouse  DME Arranged:  Negative pressure wound device DME Agency:     HH Arranged:  RN [place] Agency:  [place]  Status of Service:  In process, will continue to follow  If discussed at Long Length of Stay Meetings, dates discussed:    Additional Comments:  Latanya Maudlin, RN 05/02/2018, 9:42 AM

## 2018-05-02 NOTE — Progress Notes (Addendum)
Subjective:  CC:  Fontaine Kossman is a 78 y.o. female  Hospital stay day 1, 4 Days Post-Op left breast abscess I&D  HPI: No acute issues.  Discomfort and drainage is decreasing.  ROS:  General: Denies weight loss, weight gain, fatigue, fevers, chills, and night sweats. Heart: Denies chest pain, palpitations, racing heart, irregular heartbeat, leg pain or swelling, and decreased activity tolerance. Respiratory: Denies breathing difficulty, shortness of breath, wheezing, cough, and sputum. GI: Denies change in appetite, heartburn, nausea, vomiting, constipation, diarrhea, and blood in stool. GU: Denies difficulty urinating, pain with urinating, urgency, frequency, blood in urine.   Objective:      Temp:  [97.9 F (36.6 C)-98.4 F (36.9 C)] 98.2 F (36.8 C) (01/05 0432) Pulse Rate:  [61-68] 65 (01/05 0639) Resp:  [18] 18 (01/05 0432) BP: (135-185)/(54-72) 135/54 (01/05 0639) SpO2:  [99 %] 99 % (01/05 0432)     Height: 5\' 6"  (167.6 cm) Weight: 73.9 kg BMI (Calculated): 26.31   Intake/Output this shift:   Intake/Output Summary (Last 24 hours) at 05/02/2018 0732 Last data filed at 05/01/2018 2330 Gross per 24 hour  Intake 240 ml  Output 2000 ml  Net -1760 ml     Constitutional :  alert, cooperative, appears stated age and no distress  Skin: Cool and moist. Wound dressing intact with some discharge noted.  Tenderness still present in area but no increasing erythema or induration.  Psychiatric: Normal affect, non-agitated, not confused       LABS:  CMP Latest Ref Rng & Units 05/02/2018 05/01/2018 04/30/2018  Glucose 70 - 99 mg/dL 126(H) 153(H) 158(H)  BUN 8 - 23 mg/dL 14 16 12   Creatinine 0.44 - 1.00 mg/dL 0.74 0.79 0.76  Sodium 135 - 145 mmol/L 129(L) 130(L) 129(L)  Potassium 3.5 - 5.1 mmol/L 3.9 3.9 3.7  Chloride 98 - 111 mmol/L 99 101 99  CO2 22 - 32 mmol/L 22 23 21(L)  Calcium 8.9 - 10.3 mg/dL 8.7(L) 8.4(L) 8.8(L)  Total Protein 6.5 - 8.1 g/dL - - -  Total Bilirubin 0.3 - 1.2  mg/dL - - -  Alkaline Phos 38 - 126 U/L - - -  AST 15 - 41 U/L - - -  ALT 0 - 44 U/L - - -   CBC Latest Ref Rng & Units 05/02/2018 05/01/2018 04/30/2018  WBC 4.0 - 10.5 K/uL 5.0 5.1 5.8  Hemoglobin 12.0 - 15.0 g/dL 9.1(L) 8.5(L) 8.5(L)  Hematocrit 36.0 - 46.0 % 27.2(L) 26.3(L) 26.5(L)  Platelets 150 - 400 K/uL 329 296 293    RADS: n/a Assessment:   Left breast abscess I&D.    Pt is now requesting going home with wound vac since she has come to the realization that it is highly unlikely she will be able to find help for the daily dressing changes that will be required with wet-to-dry packing.  Will arrange wound vac at home and then ok to d/c once arrangements made.  Home with augmentin x 5days to complete abx course as well.   F/u outpt for her chronic hyponatremia.

## 2018-05-03 MED ORDER — POLYETHYLENE GLYCOL 3350 17 G PO PACK: 17.0000 g | PACK | Freq: Every day | ORAL | 0 refills | Status: AC | PRN

## 2018-05-03 MED ORDER — HYDROCODONE-ACETAMINOPHEN 5-325 MG PO TABS: 1.0000 | ORAL_TABLET | Freq: Four times a day (QID) | ORAL | 0 refills | Status: AC | PRN

## 2018-05-03 NOTE — Care Management (Signed)
Wound vac form completed by MD.  Harlin Heys to be delivered by Corene Cornea from [facility], and to be placed by bedside RN.  Anticipated discharge after vac is placed.

## 2018-05-03 NOTE — Care Management Important Message (Signed)
Copy of signed Medicare IM left with patient in room. 

## 2018-05-03 NOTE — Consult Note (Signed)
WOC spoke with CM/RN. Patient is to DC today with [facility] NP[facility].  CM is familiar with device and will assist bedside nurse with placement prior to discharge.   No further WOC nurse needs   Re consult if needed, will not follow at this time. Thanks  Whittaker Lenis [employer], RN,CWOCN, CNS, [place] [phone])

## 2018-05-03 NOTE — Progress Notes (Signed)
PT Cancellation Note  Patient Details Name: Jackie Allison MRN: [id number] DOB: 1940/06/13   Cancelled Treatment:    Reason Eval/Treat Not Completed: Patient declined, no reason specified.  Pt reports that she is going home today and has been walking around in her room just fine (pt declined physical therapy today).  Will re-attempt PT session at a later date/time if pt does not discharge today.  Leitha Bleak, PT 05/03/18, 3:17 PM [phone]

## 2018-05-03 NOTE — Progress Notes (Signed)
Initial Nutrition Assessment  DOCUMENTATION CODES:   Not applicable  INTERVENTION:   -Continue MVI with minerals daily -Discussed importance of adequate nutritional intake, especially protein and optimal glycemic control, to promote wound healing  NUTRITION DIAGNOSIS:   Increased nutrient needs related to wound healing as evidenced by estimated needs.  GOAL:   Patient will meet greater than or equal to 90% of their needs  MONITOR:   PO intake, Supplement acceptance, Labs, Weight trends, Skin, I & O's  REASON FOR ASSESSMENT:   Consult Assessment of nutrition requirement/status  ASSESSMENT:   Jackie Allison is a 78 y.o. 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  1/1- s/p Incision and drainage of left breast abscess 1/3- CWOCN nurse consulted for wound vac placement, but pt refused; plan for wet to dry dressings 1/5- pt now agreeable to wound vac  Received call from [facility], who reports pt is now agreeable to wound vac placement, which will be placed prior to d/c today. She is requesting RD consult prior to discharge today for insurance authorization.   Spoke with pt and husband at bedside, who reports that pt generally has a good appetite. Pt reports she feels like she is gaining weight during hospitalization as she is not used to eating large quantities at home. Noted meal completion 100%. Pt husband has also been providing snacks for pt to consume. Pt typically consumes 2 meals and a snack daily (Breakfast: country ham biscuit OR eggs and toast OR yogurt and bran flakes; Lunch: half sandwich OR salad; Dinner: meat, starch, and vegetable- from [place], or [employer]). Pt has been enjoying salmon and pinto beans as of late.   Pt denies any recent wt loss. Per wt hx, pt has experienced a 3% wt loss over the past 6 months, which is not significant for time frame.   Discussed with pt importance of  adequate nutrition and glycemic control to assist with wound healing. Spent most of the visit discussing ways to increase protein in diet (ex adding peanut butter to crackers or eating greek yogurt as a snack). Pt takes a MVI routinely at home and recommended continuing this while at home. Additionally discussed importance of continuing glycemic control and role of blood sugar as it pertains to wound healing. Pt and husband with no further questions but were very appreciative of visit.   No results found for: HGBA1C PTA DM medications are 500 mg metformin BID. Spoke with pt regarding glycemic control at home. She is very particular about what she eats and has eliminated most sweets from her diet. She denies any further questions regarding DM at this time and reports compliance with metformin.    Labs reviewed: Na: 129, Mg: 1.6, Glucose: 126 (inpatient orders for glycemic control are 500 mg metformin BID).   NUTRITION - FOCUSED PHYSICAL EXAM:    Most Recent Value  Orbital Region  No depletion  Upper Arm Region  No depletion  Thoracic and Lumbar Region  No depletion  Buccal Region  No depletion  Temple Region  No depletion  Clavicle Bone Region  No depletion  Clavicle and Acromion Bone Region  No depletion  Scapular Bone Region  No depletion  Dorsal Hand  No depletion  Patellar Region  No depletion  Anterior Thigh Region  No depletion  Posterior Calf Region  No depletion  Edema (RD Assessment)  None  Hair  Reviewed  Eyes  Reviewed  Mouth  Reviewed  Skin  Reviewed  Nails  Reviewed       Diet Order:   Diet Order            Diet regular Room service appropriate? Yes; Fluid consistency: Thin  Diet effective now              EDUCATION NEEDS:   Education needs have been addressed  Skin:  Skin Assessment: Skin Integrity Issues: Skin Integrity Issues:: Wound VAC Wound Vac: lt breast to be placed prior to discharge  Last BM:  04/30/18  Height:   Ht Readings from Last 1  Encounters:  04/28/18 5\' 6"  (1.676 m)    Weight:   Wt Readings from Last 1 Encounters:  04/28/18 73.9 kg    Ideal Body Weight:  59.1 kg  BMI:  Body mass index is 26.3 kg/m.  Estimated Nutritional Needs:   Kcal:  6503-5465  Protein:  100-115 grams  Fluid:  > 1.7 L    Lokelani Lutes A. Jimmye Norman, RD, LDN, CDE Pager: [phone] After hours Pager: [phone]

## 2018-05-04 NOTE — Discharge Summary (Addendum)
Physician Discharge Summary  Patient ID: Jackie Allison MRN: [id number] DOB/AGE: Nov 03, 1940 78 y.o.  Admit date: 04/28/2018 Discharge date: 05/04/2018  Admission Diagnoses: left breast abscess  Discharge Diagnoses:  Same as above  Discharged Condition: good  Hospital Course: Patient status post left breast lumpectomy in the area was noted to have a subsequent wound infection and clinic follow-up.  Initial attempt at in office drainage was unsuccessful therefore patient was admitted for formal I&D and debridement in the operating room.  She underwent the procedure without any issues and was recovering from the surgery.  She initially was hesitant on placing a wound VAC to the wound so arrangements were trying to be made to provide a daily wet-to-dry dressing changes.  However when the patient finally came to the realization that this was not going to be a realistic plan, she did decide to proceed with wound VAC placement.  It was placed at bedside personally by myself without any issues and patient was ready to be discharged and follow-up as an outpatient basis with home health arranged to provide wound care at home.   Consults: None  Discharge Exam: Blood pressure (!) 158/61, pulse 66, temperature 97.7 F (36.5 C), temperature source Oral, resp. rate 17, height 5\' 6"  (1.676 m), weight 73.9 kg, SpO2 99 %. General appearance: alert, cooperative and no distress Skin: Left breast wound clean with healthy granulation tissue already feeling in the defect.  No evidence of additional tunneling persistent infection.  Disposition:  Discharge disposition: 01-Home or Self Care       Discharge Instructions    Discharge patient   Complete by:  As directed    Discharge disposition:  01-Home or Self Care   Discharge patient date:  05/03/2018     Allergies as of 05/03/2018      Reactions   Shellfish Allergy Anaphylaxis   Iodine    Patient unsure if allergic to topical betadine Shellfish allergy  with ingestion   Sulfa Antibiotics Itching      Medication List    STOP taking these medications   cephALEXin 500 MG capsule Commonly known as:  KEFLEX     TAKE these medications   acetaminophen 500 MG tablet Commonly known as:  TYLENOL Take 500 mg by mouth every 6 (six) hours as needed for moderate pain.   aspirin 81 MG EC tablet Take 81 mg by mouth daily.   atorvastatin 10 MG tablet Commonly known as:  LIPITOR Take 10 mg by mouth daily.   benazepril 40 MG tablet Commonly known as:  LOTENSIN Take 40 mg by mouth daily.   brimonidine 0.2 % ophthalmic solution Commonly known as:  ALPHAGAN Place 1 drop into the right eye 2 (two) times daily.   carvedilol 12.5 MG tablet Commonly known as:  COREG Take 12.5 mg by mouth 2 (two) times daily with a meal.   chlorhexidine 0.12 % solution Commonly known as:  PERIDEX Use as directed 15 mLs in the mouth or throat 2 (two) times daily.   COQ10 PO Take 1 tablet by mouth daily.   dorzolamide-timolol 22.3-6.8 MG/ML ophthalmic solution Commonly known as:  COSOPT Place 1 drop into both eyes 2 (two) times daily.   hydrALAZINE 100 MG tablet Commonly known as:  APRESOLINE Take 100 mg by mouth 3 (three) times daily.   HYDROcodone-acetaminophen 5-325 MG tablet Commonly known as:  NORCO/VICODIN Take 1 tablet by mouth every 6 (six) hours as needed for up to 5 days for severe pain. What changed:  when to take this  reasons to take this   ibuprofen 800 MG tablet Commonly known as:  ADVIL,MOTRIN Take 1 tablet (800 mg total) by mouth every 8 (eight) hours as needed for mild pain or moderate pain.   latanoprost 0.005 % ophthalmic solution Commonly known as:  XALATAN Place 1 drop into both eyes at bedtime.   magnesium (amino acid chelate) 133 MG tablet Take 1 tablet by mouth daily.   metFORMIN 500 MG tablet Commonly known as:  GLUCOPHAGE Take 500 mg by mouth 2 (two) times daily with a meal.   MULTI-VITAMINS Tabs Take 1  tablet by mouth daily.   omeprazole 40 MG capsule Commonly known as:  PRILOSEC Take 40 mg by mouth daily.   polyethylene glycol packet Commonly known as:  MIRALAX Take 17 g by mouth daily as needed for mild constipation.   trolamine salicylate 10 % cream Commonly known as:  ASPERCREME Apply 1 application topically as needed for muscle pain.      Follow-up Information    Sylvester Salonga, DO Follow up in 1 week(s).   Specialty:  Surgery Contact information: [place] [address] [phone]            Total time spent arranging discharge was >57min. Signed: Benjamine Sprague 05/04/2018, 2:31 PM

## 2018-05-17 ENCOUNTER — Encounter (INDEPENDENT_AMBULATORY_CARE_PROVIDER_SITE_OTHER): Payer: Medicare Other

## 2018-05-17 ENCOUNTER — Ambulatory Visit (INDEPENDENT_AMBULATORY_CARE_PROVIDER_SITE_OTHER): Payer: Medicare Other | Admitting: Vascular Surgery

## 2018-05-20 ENCOUNTER — Telehealth: Payer: Self-pay | Admitting: *Deleted

## 2018-05-20 NOTE — Telephone Encounter (Signed)
See Dr. Tasia Catchings for a F/u per Vita Barley 05/20/18 staff message Appt has been scheduled as requested. Called and left a detailed message on pt vmail making her aware of the scheduled date and time Also a reminder letter will be mailed out as well.

## 2018-05-21 ENCOUNTER — Encounter: Payer: Self-pay | Admitting: *Deleted

## 2018-05-21 NOTE — Progress Notes (Signed)
  Oncology Nurse Navigator Documentation  Navigator Location: [facility] (05/21/18 1100)   )Navigator Encounter Type: Telephone (05/21/18 1100) Telephone: Outgoing Call (05/21/18 1100)                       Barriers/Navigation Needs: Coordination of Care (05/21/18 1100)   Interventions: Coordination of Care (05/21/18 1100)   Coordination of Care: Appts (05/21/18 1100)                  Time Spent with Patient: 15 (05/21/18 1100)   Called and informed patient of her follow up appointment with Dr. Tasia Catchings on Thursday 05/27/18 @ 3:00.

## 2018-05-27 ENCOUNTER — Encounter: Payer: Self-pay | Admitting: Oncology

## 2018-05-27 ENCOUNTER — Other Ambulatory Visit: Payer: Self-pay

## 2018-05-27 ENCOUNTER — Inpatient Hospital Stay: Payer: Medicare Other | Attending: Oncology | Admitting: Oncology

## 2018-05-27 ENCOUNTER — Encounter (INDEPENDENT_AMBULATORY_CARE_PROVIDER_SITE_OTHER): Payer: Self-pay

## 2018-05-27 VITALS — BP 121/55 | HR 72 | Temp 96.7°F | Ht 66.0 in | Wt 163.1 lb

## 2018-05-27 DIAGNOSIS — Z7982 Long term (current) use of aspirin: Secondary | ICD-10-CM | POA: Diagnosis not present

## 2018-05-27 DIAGNOSIS — D509 Iron deficiency anemia, unspecified: Secondary | ICD-10-CM | POA: Diagnosis not present

## 2018-05-27 DIAGNOSIS — Z79899 Other long term (current) drug therapy: Secondary | ICD-10-CM

## 2018-05-27 DIAGNOSIS — Z7984 Long term (current) use of oral hypoglycemic drugs: Secondary | ICD-10-CM

## 2018-05-27 DIAGNOSIS — Z170 Estrogen receptor positive status [ER+]: Secondary | ICD-10-CM

## 2018-05-27 DIAGNOSIS — G8929 Other chronic pain: Secondary | ICD-10-CM | POA: Diagnosis not present

## 2018-05-27 DIAGNOSIS — C50912 Malignant neoplasm of unspecified site of left female breast: Secondary | ICD-10-CM | POA: Diagnosis present

## 2018-05-27 DIAGNOSIS — M549 Dorsalgia, unspecified: Secondary | ICD-10-CM | POA: Diagnosis not present

## 2018-05-27 DIAGNOSIS — C50919 Malignant neoplasm of unspecified site of unspecified female breast: Secondary | ICD-10-CM

## 2018-05-27 DIAGNOSIS — Z923 Personal history of irradiation: Secondary | ICD-10-CM | POA: Insufficient documentation

## 2018-05-27 DIAGNOSIS — E119 Type 2 diabetes mellitus without complications: Secondary | ICD-10-CM | POA: Insufficient documentation

## 2018-05-27 DIAGNOSIS — I10 Essential (primary) hypertension: Secondary | ICD-10-CM | POA: Diagnosis not present

## 2018-05-27 NOTE — Progress Notes (Signed)
Hematology/Oncology follow up note [facility] Telephone:([phone]) [phone] [phone]) [phone]   Patient Care Team: Dion Body, MD as PCP - General (Family Medicine)  CHIEF COMPLAINTS/REASON FOR VISIT:  Follow up for management  of breast cancer  HISTORY OF PRESENTING ILLNESS:  Jackie Allison is a  78 y.o.  female with PMH listed below who was referred to me for evaluation of breast cancer Self palpated left breast mass for 1 month.  Patient had mammogram and ultrasound on 03/11/2018 which showed suspicious left breast mass, indeterminate left aixllary lymph node.  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   Nipple discharge: denies.  Family history: great niece had breast cancer OCP use: denies.  Estrogen and progesterone therapy: denies History of radiation to chest: denies.  Previous breast surgery: previous right breast biopsy.   INTERVAL HISTORY Jackie Allison is a 79 y.o. female who has above history reviewed by me today presents for follow up visit for management of breast cancer. During the interval, patient is status post left lumpectomy and sentinel lymph node biopsy on 04/13/2018 Pathology showed invasive mammary carcinoma, negative margin, sentinel lymph nodes negative 0/5,  Grade 2, ER positive, PR negative HER-2 negative[performed on previous biopsy]. Patient developed left breast abscess and infection status post I&D and debridement on 04/28/2018. She was recently seen by Dr. Lysle Pearl on 05/24/2018.   She reports feeling well today.  Chronic back pain.  No new symptoms.  Denies any fever, chills, left breast pain or tenderness.  Reports still some focal swelling of breast. On ciproflaxacin.   Review of Systems  Constitutional: Negative for appetite change, chills, fatigue and fever.  HENT:   Negative for hearing loss and voice change.   Eyes: Negative for eye problems.    Respiratory: Negative for chest tightness and cough.   Cardiovascular: Negative for chest pain.  Gastrointestinal: Negative for abdominal distention, abdominal pain and blood in stool.  Endocrine: Negative for hot flashes.  Genitourinary: Negative for difficulty urinating and frequency.   Musculoskeletal: Negative for arthralgias.  Skin: Negative for itching and rash.  Neurological: Negative for extremity weakness.  Hematological: Negative for adenopathy.  Psychiatric/Behavioral: Negative for confusion.    MEDICAL HISTORY:  Past Medical History:  Diagnosis Date  . Anemia    chronic microcytic anemia  . Cancer ([place]) 2019   left breast  . Diabetes mellitus without complication ([place])   . GERD (gastroesophageal reflux disease)   . Glaucoma   . Hypercholesterolemia   . Hypertension     SURGICAL HISTORY: Past Surgical History:  Procedure Laterality Date  . BACK SURGERY    . BREAST BIOPSY Right    stereo- neg  . BREAST BIOPSY Left 02/2018  . BREAST EXCISIONAL BIOPSY Left 04/03/2018   left breast cancer lumpectomy   . BREAST LUMPECTOMY Left 04/03/2018  . CATARACT EXTRACTION W/ INTRAOCULAR LENS  IMPLANT, BILATERAL    . EYE SURGERY Bilateral    cataract extraction  . INCISION AND DRAINAGE ABSCESS Left 04/28/2018   Procedure: INCISION AND DRAINAGE ABSCESS;  Surgeon: Benjamine Sprague, DO;  Location: [facility] ORS;  Service: General;  Laterality: Left;  . LUMBAR FUSION  2002?  . LUMBAR LAMINECTOMY  1997   titanium in back  . PARTIAL MASTECTOMY WITH AXILLARY SENTINEL LYMPH NODE BIOPSY Left 04/13/2018   Procedure: PARTIAL MASTECTOMY WITH AXILLARY SENTINEL LYMPH NODE BIOPSY  (No Needle Loc);  Surgeon: Benjamine Sprague, DO;  Location: [facility] ORS;  Service:  General;  Laterality: Left;  . RETINAL DETACHMENT SURGERY Bilateral 2009  . TUBAL LIGATION      SOCIAL HISTORY: Social History   Socioeconomic History  . Marital status: Married    Spouse name: [name]  . Number of children: Not on file  .  Years of education: Not on file  . Highest education level: Not on file  Occupational History  . Occupation: Restaurant manager, fast food estate  Social Needs  . Financial resource strain: Not on file  . Food insecurity:    Worry: Not on file    Inability: Not on file  . Transportation needs:    Medical: Not on file    Non-medical: Not on file  Tobacco Use  . Smoking status: Never Smoker  . Smokeless tobacco: Never Used  Substance and Sexual Activity  . Alcohol use: Never    Frequency: Never  . Drug use: Never  . Sexual activity: Not on file  Lifestyle  . Physical activity:    Days per week: Not on file    Minutes per session: Not on file  . Stress: Not on file  Relationships  . Social connections:    Talks on phone: Not on file    Gets together: Not on file    Attends religious service: Not on file    Active member of club or organization: Not on file    Attends meetings of clubs or organizations: Not on file    Relationship status: Not on file  . Intimate partner violence:    Fear of current or ex partner: Not on file    Emotionally abused: Not on file    Physically abused: Not on file    Forced sexual activity: Not on file  Other Topics Concern  . Not on file  Social History Narrative  . Not on file    FAMILY HISTORY: Family History  Problem Relation Age of Onset  . Diabetes Sister   . Diabetes Brother   . Diabetes Sister   . Breast cancer Other   . Hypertension Mother   . Glaucoma Mother   . Heart attack Father     ALLERGIES:  is allergic to shellfish allergy; iodine; and sulfa antibiotics.  MEDICATIONS:  Current Outpatient Medications  Medication Sig Dispense Refill  . acetaminophen (TYLENOL) 500 MG tablet Take 500 mg by mouth every 6 (six) hours as needed for moderate pain.     Marland Kitchen aspirin 81 MG EC tablet Take 81 mg by mouth daily.     Marland Kitchen atorvastatin (LIPITOR) 10 MG tablet Take 10 mg by mouth daily.     . benazepril (LOTENSIN) 40 MG tablet Take 40 mg by mouth  daily.     . brimonidine (ALPHAGAN) 0.2 % ophthalmic solution Place 1 drop into the right eye 2 (two) times daily.     . carvedilol (COREG) 12.5 MG tablet Take 12.5 mg by mouth 2 (two) times daily with a meal.     . chlorhexidine (PERIDEX) 0.12 % solution Use as directed 15 mLs in the mouth or throat 2 (two) times daily.   99  . Coenzyme Q10 (COQ10 PO) Take 1 tablet by mouth daily.    . dorzolamide-timolol (COSOPT) 22.3-6.8 MG/ML ophthalmic solution Place 1 drop into both eyes 2 (two) times daily.    . hydrALAZINE (APRESOLINE) 100 MG tablet Take 100 mg by mouth 3 (three) times daily.   3  . ibuprofen (ADVIL,MOTRIN) 800 MG tablet Take 1 tablet (800 mg total) by mouth every  8 (eight) hours as needed for mild pain or moderate pain. 30 tablet 0  . latanoprost (XALATAN) 0.005 % ophthalmic solution Place 1 drop into both eyes at bedtime.     . metFORMIN (GLUCOPHAGE) 500 MG tablet Take 500 mg by mouth 2 (two) times daily with a meal.     . Multiple Vitamin (MULTI-VITAMINS) TABS Take 1 tablet by mouth daily.     Marland Kitchen omeprazole (PRILOSEC) 40 MG capsule Take 40 mg by mouth daily.     . polyethylene glycol (MIRALAX) packet Take 17 g by mouth daily as needed for mild constipation. 14 each 0  . Specialty Vitamins Products (MAGNESIUM, AMINO ACID CHELATE,) 133 MG tablet Take 1 tablet by mouth daily.    Marland Kitchen trolamine salicylate (ASPERCREME) 10 % cream Apply 1 application topically as needed for muscle pain.     No current facility-administered medications for this visit.      PHYSICAL EXAMINATION: ECOG PERFORMANCE STATUS: 1 - Symptomatic but completely ambulatory Vitals:   05/27/18 0908  BP: (!) 121/55  Pulse: 72  Temp: (!) 96.7 F (35.9 C)   Filed Weights   05/27/18 0908  Weight: 163 lb 2 oz (74 kg)    Physical Exam Constitutional:      General: She is not in acute distress. HENT:     Head: Normocephalic and atraumatic.  Eyes:     General: No scleral icterus.    Pupils: Pupils are equal, round,  and reactive to light.  Neck:     Musculoskeletal: Normal range of motion and neck supple.  Cardiovascular:     Rate and Rhythm: Normal rate and regular rhythm.     Heart sounds: Normal heart sounds.  Pulmonary:     Effort: Pulmonary effort is normal. No respiratory distress.     Breath sounds: No wheezing.  Abdominal:     General: Bowel sounds are normal. There is no distension.     Palpations: Abdomen is soft. There is no mass.     Tenderness: There is no abdominal tenderness.  Musculoskeletal: Normal range of motion.        General: No deformity.  Skin:    General: Skin is warm and dry.     Findings: No erythema or rash.  Neurological:     Mental Status: She is alert and oriented to person, place, and time.     Cranial Nerves: No cranial nerve deficit.     Coordination: Coordination normal.  Psychiatric:        Behavior: Behavior normal.        Thought Content: Thought content normal.      LABORATORY DATA:  I have reviewed the data as listed Lab Results  Component Value Date   WBC 5.0 05/02/2018   HGB 9.1 (L) 05/02/2018   HCT 27.2 (L) 05/02/2018   MCV 72.9 (L) 05/02/2018   PLT 329 05/02/2018   Recent Labs    03/31/18 1159 04/24/18 2213  04/30/18 1041 05/01/18 0538 05/02/18 0624  NA 129* 128*   < > 129* 130* 129*  K 4.1 3.3*   < > 3.7 3.9 3.9  CL 96* 97*   < > 99 101 99  CO2 23 24   < > 21* 23 22  GLUCOSE 144* 144*   < > 158* 153* 126*  BUN 13 18   < > '12 16 14  '$ CREATININE 0.77 0.80   < > 0.76 0.79 0.74  CALCIUM 9.4 9.3   < > 8.8* 8.4*  8.7*  GFRNONAA >60 >60   < > >60 >60 >60  GFRAA >60 >60   < > >60 >60 >60  PROT 7.5 7.4  --   --   --   --   ALBUMIN 4.1 3.9  --   --   --   --   AST 19 13*  --   --   --   --   ALT 19 14  --   --   --   --   ALKPHOS 57 52  --   --   --   --   BILITOT 0.5 0.6  --   --   --   --    < > = values in this interval not displayed.   Iron/TIBC/Ferritin/ %Sat No results found for: IRON, TIBC, FERRITIN, IRONPCTSAT    RADIOGRAPHIC STUDIES: I have personally reviewed the radiological images as listed and agreed with the findings in the report. 03/11/2018 Diagnostic Mammogram and [place]. A radiopaque BB was placed at the site of the patient's palpable lump in the lower inner left breast at far posterior depth. A spiculated hyperdense mass is seen deep to the radiopaque BB along the chest wall. No additional suspicious findings are identified within either breast. Further evaluation with ultrasound was performed. Mammographic images were processed with CAD.  On physical exam, I palpate a firm, fixed 2-3 cm mass along the inframammary fold of the 7 o'clock position. Targeted ultrasound is performed, showing an irregular hypoechoic mass with posterior acoustic shadowing at the 7:30 position 11 cm from the nipple. It measures 2.7 x 2.5 x 2.0 cm. There is associated vascularity. This correlates well with the mammographic finding. Evaluation of the axilla demonstrates a single axillary lymph node with borderline cortical thickening of 0.4 cm. IMPRESSION: 1. Highly suspicious left breast mass corresponding with the patient's palpable lump. Recommendation is for ultrasound-guided biopsy. 2. Indeterminate left axillary lymph node. Recommendation is for ultrasound-guided biopsy.  Pathology CASE: [id number]  PATIENT: [facility]  Surgical Pathology Report  Addendum Reason for Addendum #1: Breast Biomarker Results  SPECIMEN SUBMITTED:  A. Breast, left, 7:30 o'clock 11 cm from nipple; biopsy  B. Lymph node, left axilla; biopsy  DIAGNOSIS:  A. BREAST, LEFT, LOWER INNER AT 7:30 O'CLOCK 11 CM FROM THE NIPPLE;  ULTRASOUND-GUIDED CORE BIOPSY:  - INVASIVE MAMMARY CARCINOMA, NO SPECIAL TYPE.  Size of invasive carcinoma: 12 mm in this sample  Histologic grade of invasive carcinoma: Grade 2            Glandular/tubular differentiation score: 3            Nuclear pleomorphism score: 2             Mitotic rate score: 1            Total score: 6  Ductal carcinoma in situ: Not identified  Lymphovascular invasion: Not identified  ER/PR/HER2: Immunohistochemistry will be performed on block A1, with  reflex to [place] for HER2 2+. The results will be reported in an addendum.  B. LYMPH NODE, LEFT AXILLA; ULTRASOUND-GUIDED CORE BIOPSY:  - REACTIVE LYMPH NODE.  - NEGATIVE FOR MALIGNANCY IN THIS SAMPLE.   #Estrogen Receptor (ER) Status: POSITIVE Percentage of cells with nuclear positivity: >90%  Average intensity of staining: Strong  Progesterone Receptor (PR) Status: NEGATIVE (less than 1%),   Internal control cells absent  HER2 (by immunohistochemistry): NEGATIVE (score 1+)  04/13/2018 Surgical Pathology  CASE: [id number]  PATIENT: [facility]  Surgical Pathology Report  SPECIMEN SUBMITTED:  A. Breast mass, left  B. Breast, left, anterior, superior, lateral margins  C. Breast, left, anterior, inferior margins  D. Sentinel lymph node 1, from left axilla  E. Sentinel lymph node 2, from left axilla   CLINICAL HISTORY:  78 year old female with invasive mammary carcinoma of left breast along  the inframammary fold 7:30 position 11 cm from nipple, measuring 2.7 x  2.5 x 2.0 cm by ultrasound; indeterminate left axillary lymph node   PRE-OPERATIVE DIAGNOSIS:  Left breast cancer   POST-OPERATIVE DIAGNOSIS:  Same as pre-op      DIAGNOSIS:  A. BREAST, LEFT; LUMPECTOMY:  - INVASIVE MAMMARY CARCINOMA.  - SEE CANCER SUMMARY BELOW.  - BIOPSY SITE CHANGE.   B. BREAST, LEFT ANTERIOR, SUPERIOR, LATERAL MARGIN; EXCISION:  - BREAST TISSUE, NEGATIVE FOR MALIGNANCY.   C. BREAST, LEFT ANTERIOR, INFERIOR MARGINS; EXCISION:  - SKIN AND UNDERLYING SUBCUTANEOUS ADIPOSETISSUE WITH CAUTERY ARTIFACT  AT DEEP ASPECT.  - NEGATIVE FOR MALIGNANCY.   D. SENTINEL LYMPH NODE 1, LEFT AXILLA; EXCISION:  - ONE LYMPH NODE NEGATIVE FOR MALIGNANCY (0/1).  -  CHANGES CONSISTENT WITH PRIOR BIOPSY.   E. SENTINEL LYMPH NODE 2, LEFT AXILLA; EXCISION:  - FOUR LYMPH NODES NEGATIVE FOR MALIGNANCY (0/4).   CANCER CASE SUMMARY: INVASIVE CARCINOMA OF THE BREAST  Procedure: Lumpectomy  Specimen Laterality: Left  Tumor Size: 28 mm  Histologic Type: Invasive mammary carcinoma no special type  Histologic Grade ([place] Histologic Score)    Glandular (Acinar)/Tubular Differentiation: 3       Nuclear Pleomorphism: 2       Mitotic Rate: 1       Overall Grade: 2  Ductal Carcinoma In Situ (DCIS): Not identified  Margins:       Invasive Carcinoma Margins: Uninvolved by invasive carcinoma            Distance from closest margin: 1.5 mm            Specify closest margin: Deep  Regional Lymph Nodes: Uninvolved by tumor cells    Number of Lymph Nodes Examined: 5    Number of Sentinel Nodes Examined: 5  Lymphovascular Invasion: Not identified  Pathologic Stage Classification (pTNM, AJCC 8th Edition): pT2 pN0 (sn)   BREAST BIOMARKER TESTS - performed on prior biopsy [id number]  Estrogen Receptor (ER) Status: POSITIVE  Progesterone Receptor (PR) Status: NEGATIVE  HER2 (by immunohistochemistry): NEGATIVE     ASSESSMENT & PLAN:  1. Malignant neoplasm of breast in female, estrogen receptor positive, unspecified laterality, unspecified site of breast ([place])   2. Microcytic anemia    pT2 pN0 ER positive invasive mammary carcinoma. Pathology results were discussed with patient. We discussed about availability of Oncotype which can further provide predictive and prognostic value of chemotherapy benefit. If she is not interested in chemotherapy, will omit OncotypeDx testing.  Patient feels that she would like to have the test done even if she may not choose to proceed with chemotherapy.  Will send OncotypeDx.  Refer to RadOnc for adjuvant RT.  Discussed about anti estrogen treatment after RT.   #  Microcytic anemia,   check retic panel and CBC, iron panel    Orders Placed This Encounter  Procedures  . Ambulatory referral to Radiation Oncology    Referral Priority:   Routine    Referral Type:   Consultation    Referral Reason:   Specialty Services Required    Referred to Provider:   Noreene Filbert, MD    Requested Specialty:  Radiation Oncology    Number of Visits Requested:   1    All questions were answered. The patient knows to call the clinic with any problems questions or concerns.  Return of visit: to be determined.  We spent sufficient time to discuss many aspect of care, questions were answered to patient's satisfaction. Total face to face encounter time for this patient visit was 40 min. >50% of the time was  spent in counseling and coordination of care.  Earlie Server, MD, PhD Hematology Oncology [facility] Pager- [phone] 05/27/2018

## 2018-05-27 NOTE — Progress Notes (Signed)
Patient here today for follow up.  Patient states no new concerns today  

## 2018-06-02 ENCOUNTER — Inpatient Hospital Stay: Payer: Medicare Other | Attending: Radiation Oncology

## 2018-06-02 DIAGNOSIS — Z923 Personal history of irradiation: Secondary | ICD-10-CM | POA: Insufficient documentation

## 2018-06-02 DIAGNOSIS — E871 Hypo-osmolality and hyponatremia: Secondary | ICD-10-CM | POA: Insufficient documentation

## 2018-06-02 DIAGNOSIS — G8929 Other chronic pain: Secondary | ICD-10-CM | POA: Insufficient documentation

## 2018-06-02 DIAGNOSIS — Z7984 Long term (current) use of oral hypoglycemic drugs: Secondary | ICD-10-CM | POA: Insufficient documentation

## 2018-06-02 DIAGNOSIS — D508 Other iron deficiency anemias: Secondary | ICD-10-CM | POA: Diagnosis not present

## 2018-06-02 DIAGNOSIS — Z79899 Other long term (current) drug therapy: Secondary | ICD-10-CM | POA: Diagnosis not present

## 2018-06-02 DIAGNOSIS — Z170 Estrogen receptor positive status [ER+]: Secondary | ICD-10-CM | POA: Insufficient documentation

## 2018-06-02 DIAGNOSIS — I10 Essential (primary) hypertension: Secondary | ICD-10-CM | POA: Insufficient documentation

## 2018-06-02 DIAGNOSIS — Z7982 Long term (current) use of aspirin: Secondary | ICD-10-CM | POA: Diagnosis not present

## 2018-06-02 DIAGNOSIS — M549 Dorsalgia, unspecified: Secondary | ICD-10-CM | POA: Diagnosis not present

## 2018-06-02 DIAGNOSIS — C50912 Malignant neoplasm of unspecified site of left female breast: Secondary | ICD-10-CM | POA: Insufficient documentation

## 2018-06-02 DIAGNOSIS — D509 Iron deficiency anemia, unspecified: Secondary | ICD-10-CM

## 2018-06-02 DIAGNOSIS — H409 Unspecified glaucoma: Secondary | ICD-10-CM | POA: Insufficient documentation

## 2018-06-02 DIAGNOSIS — K219 Gastro-esophageal reflux disease without esophagitis: Secondary | ICD-10-CM | POA: Diagnosis not present

## 2018-06-02 DIAGNOSIS — E7800 Pure hypercholesterolemia, unspecified: Secondary | ICD-10-CM | POA: Insufficient documentation

## 2018-06-02 DIAGNOSIS — E119 Type 2 diabetes mellitus without complications: Secondary | ICD-10-CM | POA: Insufficient documentation

## 2018-06-02 LAB — RETIC PANEL
Immature Retic Fract: 4 % (ref 2.3–15.9)
RBC.: 4.42 MIL/uL (ref 3.87–5.11)
Retic Count, Absolute: 39.8 10*3/uL (ref 19.0–186.0)
Retic Ct Pct: 0.9 % (ref 0.4–3.1)
Reticulocyte Hemoglobin: 28.4 pg (ref >27.9)

## 2018-06-02 LAB — CBC WITH DIFFERENTIAL/PLATELET
Abs Immature Granulocytes: 0.02 10*3/uL (ref 0.00–0.07)
BASOS ABS: 0.1 10*3/uL (ref 0.0–0.1)
Basophils Relative: 1 %
Eosinophils Absolute: 0.1 10*3/uL (ref 0.0–0.5)
Eosinophils Relative: 2 %
HCT: 31.8 % — ABNORMAL LOW (ref 36.0–46.0)
Hemoglobin: 10.3 g/dL — ABNORMAL LOW (ref 12.0–15.0)
Immature Granulocytes: 0 %
Lymphocytes Relative: 19 %
Lymphs Abs: 1 10*3/uL (ref 0.7–4.0)
MCH: 23.3 pg — ABNORMAL LOW (ref 26.0–34.0)
MCHC: 32.4 g/dL (ref 30.0–36.0)
MCV: 71.9 fL — ABNORMAL LOW (ref 80.0–100.0)
Monocytes Absolute: 0.7 10*3/uL (ref 0.1–1.0)
Monocytes Relative: 13 %
NRBC: 0 % (ref 0.0–0.2)
Neutro Abs: 3.6 10*3/uL (ref 1.7–7.7)
Neutrophils Relative %: 65 %
Platelets: 334 10*3/uL (ref 150–400)
RBC: 4.42 MIL/uL (ref 3.87–5.11)
RDW: 15.7 % — ABNORMAL HIGH (ref 11.5–15.5)
WBC: 5.5 10*3/uL (ref 4.0–10.5)

## 2018-06-02 LAB — IRON AND TIBC
IRON: 51 ug/dL (ref 28–170)
Saturation Ratios: 15 % (ref 10.4–31.8)
TIBC: 332 ug/dL (ref 250–450)
UIBC: 281 ug/dL

## 2018-06-02 LAB — FERRITIN: Ferritin: 15 ng/mL (ref 11–307)

## 2018-06-03 ENCOUNTER — Other Ambulatory Visit: Payer: Self-pay

## 2018-06-03 MED ORDER — FERROUS SULFATE 325 (65 FE) MG PO TBEC: 325.0000 mg | DELAYED_RELEASE_TABLET | Freq: Two times a day (BID) | ORAL | 3 refills | Status: DC

## 2018-06-07 ENCOUNTER — Encounter: Payer: Self-pay | Admitting: Oncology

## 2018-06-10 ENCOUNTER — Encounter: Payer: Self-pay | Admitting: Oncology

## 2018-06-10 ENCOUNTER — Inpatient Hospital Stay (HOSPITAL_BASED_OUTPATIENT_CLINIC_OR_DEPARTMENT_OTHER): Payer: Medicare Other | Admitting: Oncology

## 2018-06-10 ENCOUNTER — Other Ambulatory Visit: Payer: Self-pay

## 2018-06-10 VITALS — BP 177/79 | HR 66 | Temp 96.5°F | Resp 18 | Wt 160.3 lb

## 2018-06-10 DIAGNOSIS — E7800 Pure hypercholesterolemia, unspecified: Secondary | ICD-10-CM

## 2018-06-10 DIAGNOSIS — Z7982 Long term (current) use of aspirin: Secondary | ICD-10-CM

## 2018-06-10 DIAGNOSIS — E871 Hypo-osmolality and hyponatremia: Secondary | ICD-10-CM

## 2018-06-10 DIAGNOSIS — C50919 Malignant neoplasm of unspecified site of unspecified female breast: Secondary | ICD-10-CM

## 2018-06-10 DIAGNOSIS — C50912 Malignant neoplasm of unspecified site of left female breast: Secondary | ICD-10-CM | POA: Diagnosis not present

## 2018-06-10 DIAGNOSIS — G8929 Other chronic pain: Secondary | ICD-10-CM

## 2018-06-10 DIAGNOSIS — D508 Other iron deficiency anemias: Secondary | ICD-10-CM

## 2018-06-10 DIAGNOSIS — Z7984 Long term (current) use of oral hypoglycemic drugs: Secondary | ICD-10-CM

## 2018-06-10 DIAGNOSIS — K219 Gastro-esophageal reflux disease without esophagitis: Secondary | ICD-10-CM

## 2018-06-10 DIAGNOSIS — H409 Unspecified glaucoma: Secondary | ICD-10-CM

## 2018-06-10 DIAGNOSIS — Z923 Personal history of irradiation: Secondary | ICD-10-CM | POA: Diagnosis not present

## 2018-06-10 DIAGNOSIS — Z170 Estrogen receptor positive status [ER+]: Secondary | ICD-10-CM

## 2018-06-10 DIAGNOSIS — E119 Type 2 diabetes mellitus without complications: Secondary | ICD-10-CM

## 2018-06-10 DIAGNOSIS — I10 Essential (primary) hypertension: Secondary | ICD-10-CM

## 2018-06-10 DIAGNOSIS — M549 Dorsalgia, unspecified: Secondary | ICD-10-CM

## 2018-06-10 DIAGNOSIS — Z79899 Other long term (current) drug therapy: Secondary | ICD-10-CM

## 2018-06-10 NOTE — Progress Notes (Signed)
Patient here for follow up. Pt's appetite is poor. She states she is on a salt restriction and that is why she is not eating. Blood pressure elevated, she states she has not taken BP medication this morning.

## 2018-06-11 ENCOUNTER — Other Ambulatory Visit: Payer: Self-pay | Admitting: *Deleted

## 2018-06-11 DIAGNOSIS — C50919 Malignant neoplasm of unspecified site of unspecified female breast: Secondary | ICD-10-CM

## 2018-06-11 DIAGNOSIS — Z170 Estrogen receptor positive status [ER+]: Principal | ICD-10-CM

## 2018-06-11 MED ORDER — DEXAMETHASONE 4 MG PO TABS: 8.0000 mg | ORAL_TABLET | Freq: Two times a day (BID) | ORAL | 1 refills | Status: DC

## 2018-06-11 MED ORDER — ONDANSETRON HCL 8 MG PO TABS: 8.0000 mg | ORAL_TABLET | Freq: Two times a day (BID) | ORAL | 1 refills | Status: DC | PRN

## 2018-06-11 MED ORDER — LIDOCAINE-PRILOCAINE 2.5-2.5 % EX CREA: TOPICAL_CREAM | CUTANEOUS | 3 refills | Status: DC

## 2018-06-11 MED ORDER — PROCHLORPERAZINE MALEATE 10 MG PO TABS: 10.0000 mg | ORAL_TABLET | Freq: Four times a day (QID) | ORAL | 1 refills | Status: DC | PRN

## 2018-06-11 NOTE — Progress Notes (Signed)
Hematology/Oncology follow up note [facility] [phone] [phone]   Patient Care Team: Dion Body, MD as PCP - General (Family Medicine)  CHIEF COMPLAINTS/REASON FOR VISIT:  Follow up for management  of breast cancer  HISTORY OF PRESENTING ILLNESS:  Jackie Allison is a  78 y.o.  female with PMH listed below who was referred to me for evaluation of breast cancer Self palpated left breast mass for 1 month.  Patient had mammogram and ultrasound on 03/11/2018 which showed suspicious left breast mass, indeterminate left aixllary lymph node.  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  Nipple discharge: denies.  Family history: great niece had breast cancer OCP use: denies.  Estrogen and progesterone therapy: denies History of radiation to chest: denies.  Previous breast surgery: previous right breast biopsy.   #  04/13/2018 underwent left lumpectomy and sentinel lymph node biopsy on Pathology showed invasive mammary carcinoma, negative margin, sentinel lymph nodes negative 0/5,  Grade 2, ER positive, PR negative HER-2 negative[performed on previous biopsy].Lymphovascular invasion: Not identified  Patient developed left breast abscess and infection status post I&D and debridement on 04/28/2018  INTERVAL HISTORY Jackie Allison is a 78 y.o. female who has above history reviewed by me today presents for follow up visit for management of stage IIA breast cancer.  Patient was seen by me 2 weeks ago and after discussion patient is interested in chemotherapy if she needs and desires further testing with Oncotype DX testing. Today she present to discuss lab results and further management plan. She feels well today.  Chronic back pain is at baseline.  No new symptoms. Denies any fever, chills, left breast pain or tenderness. Patient was seen by Dr. Lysle Pearl on 06/07/2018.  She has an  appointment with Dr. Lysle Pearl for wound check on 06/21/2018.   Review of Systems  Constitutional: Negative for appetite change, chills, fatigue and fever.  HENT:   Negative for hearing loss and voice change.   Eyes: Negative for eye problems.  Respiratory: Negative for chest tightness and cough.   Cardiovascular: Negative for chest pain.  Gastrointestinal: Negative for abdominal distention, abdominal pain and blood in stool.  Endocrine: Negative for hot flashes.  Genitourinary: Negative for difficulty urinating and frequency.   Musculoskeletal: Negative for arthralgias.       Chronic back pain  Skin: Negative for itching and rash.  Neurological: Negative for extremity weakness.  Hematological: Negative for adenopathy.  Psychiatric/Behavioral: Negative for confusion.    MEDICAL HISTORY:  Past Medical History:  Diagnosis Date  . Anemia    chronic microcytic anemia  . Cancer ([place]) 2019   left breast  . Diabetes mellitus without complication ([place])   . GERD (gastroesophageal reflux disease)   . Glaucoma   . Hypercholesterolemia   . Hypertension     SURGICAL HISTORY: Past Surgical History:  Procedure Laterality Date  . BACK SURGERY    . BREAST BIOPSY Right    stereo- neg  . BREAST BIOPSY Left 02/2018  . BREAST EXCISIONAL BIOPSY Left 04/03/2018   left breast cancer lumpectomy   . BREAST LUMPECTOMY Left 04/03/2018  . CATARACT EXTRACTION W/ INTRAOCULAR LENS  IMPLANT, BILATERAL    . EYE SURGERY Bilateral    cataract extraction  . INCISION AND DRAINAGE ABSCESS Left 04/28/2018   Procedure: INCISION AND DRAINAGE ABSCESS;  Surgeon: Benjamine Sprague, DO;  Location: [facility] ORS;  Service: General;  Laterality: Left;  . LUMBAR FUSION  2002?  . LUMBAR LAMINECTOMY  1997   titanium in back  . PARTIAL MASTECTOMY WITH AXILLARY SENTINEL LYMPH NODE BIOPSY Left 04/13/2018   Procedure: PARTIAL MASTECTOMY WITH AXILLARY SENTINEL LYMPH NODE BIOPSY  (No Needle Loc);  Surgeon: Benjamine Sprague, DO;  Location:  [facility] ORS;  Service: General;  Laterality: Left;  . RETINAL DETACHMENT SURGERY Bilateral 2009  . TUBAL LIGATION      SOCIAL HISTORY: Social History   Socioeconomic History  . Marital status: Married    Spouse name: [name]  . Number of children: Not on file  . Years of education: Not on file  . Highest education level: Not on file  Occupational History  . Occupation: Restaurant manager, fast food estate  Social Needs  . Financial resource strain: Not on file  . Food insecurity:    Worry: Not on file    Inability: Not on file  . Transportation needs:    Medical: Not on file    Non-medical: Not on file  Tobacco Use  . Smoking status: Never Smoker  . Smokeless tobacco: Never Used  Substance and Sexual Activity  . Alcohol use: Never    Frequency: Never  . Drug use: Never  . Sexual activity: Not on file  Lifestyle  . Physical activity:    Days per week: Not on file    Minutes per session: Not on file  . Stress: Not on file  Relationships  . Social connections:    Talks on phone: Not on file    Gets together: Not on file    Attends religious service: Not on file    Active member of club or organization: Not on file    Attends meetings of clubs or organizations: Not on file    Relationship status: Not on file  . Intimate partner violence:    Fear of current or ex partner: Not on file    Emotionally abused: Not on file    Physically abused: Not on file    Forced sexual activity: Not on file  Other Topics Concern  . Not on file  Social History Narrative  . Not on file    FAMILY HISTORY: Family History  Problem Relation Age of Onset  . Diabetes Sister   . Diabetes Brother   . Diabetes Sister   . Breast cancer Other   . Hypertension Mother   . Glaucoma Mother   . Heart attack Father     ALLERGIES:  is allergic to shellfish allergy; iodine; and sulfa antibiotics.  MEDICATIONS:  Current Outpatient Medications  Medication Sig Dispense Refill  . acetaminophen (TYLENOL) 500 MG  tablet Take 500 mg by mouth every 6 (six) hours as needed for moderate pain.     Marland Kitchen aspirin 81 MG EC tablet Take 81 mg by mouth daily.     Marland Kitchen atorvastatin (LIPITOR) 10 MG tablet Take 10 mg by mouth daily.     . benazepril (LOTENSIN) 40 MG tablet Take 40 mg by mouth daily.     . brimonidine (ALPHAGAN) 0.2 % ophthalmic solution Place 1 drop into the right eye 2 (two) times daily.     . carvedilol (COREG) 12.5 MG tablet Take 12.5 mg by mouth 2 (two) times daily with a meal.     . chlorhexidine (PERIDEX) 0.12 % solution Use as directed 15 mLs in the mouth or throat 2 (two) times daily.   99  . Coenzyme Q10 (COQ10 PO) Take 1 tablet by mouth daily.    . dorzolamide-timolol (COSOPT) 22.3-6.8  MG/ML ophthalmic solution Place 1 drop into both eyes 2 (two) times daily.    . ferrous sulfate 325 (65 FE) MG EC tablet Take 1 tablet (325 mg total) by mouth 2 (two) times daily with a meal. 30 tablet 3  . hydrALAZINE (APRESOLINE) 100 MG tablet Take 100 mg by mouth 3 (three) times daily.   3  . ibuprofen (ADVIL,MOTRIN) 800 MG tablet Take 1 tablet (800 mg total) by mouth every 8 (eight) hours as needed for mild pain or moderate pain. 30 tablet 0  . latanoprost (XALATAN) 0.005 % ophthalmic solution Place 1 drop into both eyes at bedtime.     . metFORMIN (GLUCOPHAGE) 500 MG tablet Take 500 mg by mouth 2 (two) times daily with a meal.     . Multiple Vitamin (MULTI-VITAMINS) TABS Take 1 tablet by mouth daily.     Marland Kitchen omeprazole (PRILOSEC) 40 MG capsule Take 40 mg by mouth daily.     Marland Kitchen Specialty Vitamins Products (MAGNESIUM, AMINO ACID CHELATE,) 133 MG tablet Take 1 tablet by mouth daily.    Marland Kitchen trolamine salicylate (ASPERCREME) 10 % cream Apply 1 application topically as needed for muscle pain.    . Dakins (HYSEPT) 0.25 % SOLN PLEASE SEE ATTACHED FOR DETAILED DIRECTIONS     No current facility-administered medications for this visit.      PHYSICAL EXAMINATION: ECOG PERFORMANCE STATUS: 1 - Symptomatic but completely  ambulatory Vitals:   06/10/18 1027  BP: (!) 177/79  Pulse: 66  Resp: 18  Temp: (!) 96.5 F (35.8 C)   Filed Weights   06/10/18 1027  Weight: 160 lb 4.8 oz (72.7 kg)    Physical Exam Constitutional:      General: She is not in acute distress. HENT:     Head: Normocephalic and atraumatic.  Eyes:     General: No scleral icterus.    Pupils: Pupils are equal, round, and reactive to light.  Neck:     Musculoskeletal: Normal range of motion and neck supple.  Cardiovascular:     Rate and Rhythm: Normal rate and regular rhythm.     Heart sounds: Normal heart sounds.  Pulmonary:     Effort: Pulmonary effort is normal. No respiratory distress.     Breath sounds: No wheezing.  Abdominal:     General: Bowel sounds are normal. There is no distension.     Palpations: Abdomen is soft. There is no mass.     Tenderness: There is no abdominal tenderness.  Musculoskeletal: Normal range of motion.        General: No deformity.  Skin:    General: Skin is warm and dry.     Findings: No erythema or rash.  Neurological:     Mental Status: She is alert and oriented to person, place, and time.     Cranial Nerves: No cranial nerve deficit.     Coordination: Coordination normal.  Psychiatric:        Behavior: Behavior normal.        Thought Content: Thought content normal.      LABORATORY DATA:  I have reviewed the data as listed Lab Results  Component Value Date   WBC 5.5 06/02/2018   HGB 10.3 (L) 06/02/2018   HCT 31.8 (L) 06/02/2018   MCV 71.9 (L) 06/02/2018   PLT 334 06/02/2018   Recent Labs    03/31/18 1159 04/24/18 2213  04/30/18 1041 05/01/18 0538 05/02/18 0624  NA 129* 128*   < > 129* 130* 129*  K 4.1 3.3*   < > 3.7 3.9 3.9  CL 96* 97*   < > 99 101 99  CO2 23 24   < > 21* 23 22  GLUCOSE 144* 144*   < > 158* 153* 126*  BUN 13 18   < > '12 16 14  '$ CREATININE 0.77 0.80   < > 0.76 0.79 0.74  CALCIUM 9.4 9.3   < > 8.8* 8.4* 8.7*  GFRNONAA >60 >60   < > >60 >60 >60    GFRAA >60 >60   < > >60 >60 >60  PROT 7.5 7.4  --   --   --   --   ALBUMIN 4.1 3.9  --   --   --   --   AST 19 13*  --   --   --   --   ALT 19 14  --   --   --   --   ALKPHOS 57 52  --   --   --   --   BILITOT 0.5 0.6  --   --   --   --    < > = values in this interval not displayed.   Iron/TIBC/Ferritin/ %Sat    Component Value Date/Time   IRON 51 06/02/2018 1120   TIBC 332 06/02/2018 1120   FERRITIN 15 06/02/2018 1120   IRONPCTSAT 15 06/02/2018 1120     RADIOGRAPHIC STUDIES: I have personally reviewed the radiological images as listed and agreed with the findings in the report. 03/11/2018 Diagnostic Mammogram and [place]. A radiopaque BB was placed at the site of the patient's palpable lump in the lower inner left breast at far posterior depth. A spiculated hyperdense mass is seen deep to the radiopaque BB along the chest wall. No additional suspicious findings are identified within either breast. Further evaluation with ultrasound was performed. Mammographic images were processed with CAD.  On physical exam, I palpate a firm, fixed 2-3 cm mass along the inframammary fold of the 7 o'clock position. Targeted ultrasound is performed, showing an irregular hypoechoic mass with posterior acoustic shadowing at the 7:30 position 11 cm from the nipple. It measures 2.7 x 2.5 x 2.0 cm. There is associated vascularity. This correlates well with the mammographic finding. Evaluation of the axilla demonstrates a single axillary lymph node with borderline cortical thickening of 0.4 cm. IMPRESSION: 1. Highly suspicious left breast mass corresponding with the patient's palpable lump. Recommendation is for ultrasound-guided biopsy. 2. Indeterminate left axillary lymph node. Recommendation is for ultrasound-guided biopsy.  Same as pre-op    ASSESSMENT & PLAN:  1. Malignant neoplasm of breast in female, estrogen receptor positive, unspecified laterality, unspecified site of breast ([place])   2.  Hyponatremia   3. Other iron deficiency anemia   Cancer Staging Breast cancer in female [facility]) Staging form: Breast, AJCC 8th Edition - Clinical: No stage assigned - Unsigned - Pathologic stage from 06/11/2018: Stage IIA (pT2, pN0, cM0, G2, ER+, PR-, HER2-, Oncotype DX score: 27) - Signed by Earlie Server, MD on 06/11/2018   pT2 pN0 ER positive invasive mammary carcinoma. Oncotype DX recurrence score came back at 27, predicting distant recurrence risk at 9 years with tamoxifen alone around 16%.  Group average absolute chemotherapy benefit >15%  Discussed with patient about adjuvant chemotherapy with 4 cycles of TC. Rationale of chemotherapy discussed with patient. The diagnosis and care plan were discussed with patient in detail.  NCCN guidelines were reviewed and shared with patient.  The goal of treatment which is with curable intent, to reduce future distant metastasis. Chemotherapy education was provided.  We had discussed the composition of chemotherapy regimen, length of chemo cycle, duration of treatment and the time to assess response to treatment.    I explained to the patient the risks and benefits of chemotherapy Taxotere and Cytoxan including all but not limited to infustion reactions, hair loss, mouth sore, nausea, vomiting, low blood counts, bleeding, and risk of life threatening infection and even death, secondary malignancy etc. Patient voices understanding and appreciated explanation.  Patient wants to think about it and update me about her decision.  She called on 06/11/2018 and informed me that she wants to the proceed with chemotherapy.  and willing to proceed chemotherapy.   # Chemotherapy education; Medi- port placement. Antiemetics-Zofran and Compazine; Dexamethasone, EMLA cream sent to pharmacy.  #She has appointment to establish care with RadOnc for adjuvant RT. We discussed that since she desires chemotherapy which will be given first. RT will be offered after she finishes  adjuvant chemotherapy.    # Microcytic anemia, checked retic panel and CBC, iron panel  Labs reviewed, consistent with iron deficiency. Will discuss with patient about IV iron.    Orders Placed This Encounter  Procedures  . CBC with Differential    Standing Status:   Standing    Number of Occurrences:   20    Standing Expiration Date:   06/12/2019  . Comprehensive metabolic panel    Standing Status:   Standing    Number of Occurrences:   20    Standing Expiration Date:   06/12/2019    Return of visit: Day 1 of chemotherapy.  We spent sufficient time to discuss many aspect of care, questions were answered to patient's satisfaction. Total face to face encounter time for this patient visit was 25 min. >50% of the time was  spent in counseling and coordination of care.   Earlie Server, MD, PhD Hematology Oncology [facility] at [facility] Pager- [phone] 06/11/2018

## 2018-06-11 NOTE — Progress Notes (Signed)
START ON PATHWAY REGIMEN - Breast     A cycle is every 21 days:     Docetaxel      Cyclophosphamide   **Always confirm dose/schedule in your pharmacy ordering system**  Patient Characteristics: Postoperative without Neoadjuvant Therapy (Pathologic Staging), Invasive Disease, Adjuvant Therapy, HER2 Negative/Unknown/Equivocal, ER Positive, Node Negative, pT1a-c, pN0/N88m or pT2 or Higher, pN0, Oncotype High Risk (? 26) Therapeutic Status: Postoperative without Neoadjuvant Therapy (Pathologic Staging) AJCC Grade: G2 AJCC N Category: pN0 AJCC M Category: cM0 ER Status: Positive (+) AJCC 8 Stage Grouping: IIA HER2 Status: Negative (-) Oncotype Dx Recurrence Score: 27 AJCC T Category: pT2 PR Status: Negative (-) Has this patient completed genomic testing<= Yes - Oncotype DX(R) Intent of Therapy: Curative Intent, Discussed with Patient

## 2018-06-14 ENCOUNTER — Telehealth: Payer: Self-pay | Admitting: *Deleted

## 2018-06-14 ENCOUNTER — Other Ambulatory Visit: Payer: Self-pay | Admitting: *Deleted

## 2018-06-14 ENCOUNTER — Ambulatory Visit
Admission: RE | Admit: 2018-06-14 | Discharge: 2018-06-14 | Disposition: A | Discharge status: Home / Self Care | Payer: Medicare Other | Source: Ambulatory Visit | Attending: Radiation Oncology | Admitting: Radiation Oncology

## 2018-06-14 ENCOUNTER — Other Ambulatory Visit: Payer: Self-pay

## 2018-06-14 ENCOUNTER — Encounter: Payer: Self-pay | Admitting: Radiation Oncology

## 2018-06-14 VITALS — BP 142/67 | HR 71 | Temp 97.7°F | Resp 18 | Wt 159.7 lb

## 2018-06-14 DIAGNOSIS — E871 Hypo-osmolality and hyponatremia: Secondary | ICD-10-CM

## 2018-06-14 DIAGNOSIS — Z7982 Long term (current) use of aspirin: Secondary | ICD-10-CM | POA: Diagnosis not present

## 2018-06-14 DIAGNOSIS — Z170 Estrogen receptor positive status [ER+]: Principal | ICD-10-CM

## 2018-06-14 DIAGNOSIS — I10 Essential (primary) hypertension: Secondary | ICD-10-CM | POA: Insufficient documentation

## 2018-06-14 DIAGNOSIS — Z79899 Other long term (current) drug therapy: Secondary | ICD-10-CM | POA: Diagnosis not present

## 2018-06-14 DIAGNOSIS — R5383 Other fatigue: Secondary | ICD-10-CM | POA: Insufficient documentation

## 2018-06-14 DIAGNOSIS — C50919 Malignant neoplasm of unspecified site of unspecified female breast: Secondary | ICD-10-CM

## 2018-06-14 DIAGNOSIS — Z7984 Long term (current) use of oral hypoglycemic drugs: Secondary | ICD-10-CM | POA: Diagnosis not present

## 2018-06-14 DIAGNOSIS — E119 Type 2 diabetes mellitus without complications: Secondary | ICD-10-CM | POA: Diagnosis not present

## 2018-06-14 DIAGNOSIS — K219 Gastro-esophageal reflux disease without esophagitis: Secondary | ICD-10-CM | POA: Insufficient documentation

## 2018-06-14 DIAGNOSIS — C50312 Malignant neoplasm of lower-inner quadrant of left female breast: Secondary | ICD-10-CM | POA: Insufficient documentation

## 2018-06-14 DIAGNOSIS — E7800 Pure hypercholesterolemia, unspecified: Secondary | ICD-10-CM | POA: Insufficient documentation

## 2018-06-14 DIAGNOSIS — C50912 Malignant neoplasm of unspecified site of left female breast: Secondary | ICD-10-CM

## 2018-06-14 MED ORDER — ONDANSETRON HCL 8 MG PO TABS: 8.0000 mg | ORAL_TABLET | Freq: Two times a day (BID) | ORAL | 1 refills | Status: DC | PRN

## 2018-06-14 NOTE — Telephone Encounter (Signed)
Pharamcy requested new rx with diagnosis.

## 2018-06-14 NOTE — Telephone Encounter (Signed)
Need diagnosed code on Zofran prescription

## 2018-06-14 NOTE — Consult Note (Signed)
NEW PATIENT EVALUATION  Name: Jackie Allison  MRN: [id number]  Date:   06/14/2018     DOB: 1941/04/08   This 78 y.o. 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  REFERRING PHYSICIAN: Earlie Server, MD  CHIEF COMPLAINT:  Chief Complaint  Patient presents with  . Breast Cancer    Inital consultation of breast cancer    DIAGNOSIS: The encounter diagnosis was Malignant neoplasm of left breast in female, estrogen receptor positive, unspecified site of breast ([place]).   PREVIOUS INVESTIGATIONS:  ammogram and ultrasound reviewed Pathology reports reviewed Clinical notes reviewed  HPI: Jackie Allison is a 78 year old female presented with a self discovered mass in her left breast. Initial imaging showed a highly suspicious lesion the left breast corresponding to late patient's palpable lump. Indeterminate left axillary lymph nodes were seen.MRI scan confirmed a 2.7 cm mass in the lower inner left breast no evidence of multifocal or multicentric disease. One axillary lymph node at border line cortical thickening and was biopsied and was negative. Patient underwent wide local excisionand sentinel node biopsy. Tumor was overall grade 2 measured 2.8 cm in greatest dimension margins were clear at 1.5 mm. Sentinel lymph nodes were submitted all negative for metastatic disease. Tumor was ER positive PR negative and HER-2/neu not overexpressed.patient had a abscess formation her lumpectomy cavity was treated with antibiotic therapy and has slowly resolved.Oncotype DX was performed showing recurrence risk of 27 and patient will receive adjuvant chemotherapy withDocetaxel Cyclophosphamide . She seen today and is doing well she's quite fatigued. No soreness in the breast all still some erythematous changes of the skin noted.  PLANNED  TREATMENT REGIMEN: djuvant chemotherapy followed by whole breast radiation  PAST MEDICAL HISTORY:  has a past medical history of Anemia, Cancer ([place]) (2019), Diabetes mellitus without complication ([place]), GERD (gastroesophageal reflux disease), Glaucoma, Hypercholesterolemia, and Hypertension.    PAST SURGICAL HISTORY:  Past Surgical History:  Procedure Laterality Date  . BACK SURGERY    . BREAST BIOPSY Right    stereo- neg  . BREAST BIOPSY Left 02/2018  . BREAST EXCISIONAL BIOPSY Left 04/03/2018   left breast cancer lumpectomy   . BREAST LUMPECTOMY Left 04/03/2018  . CATARACT EXTRACTION W/ INTRAOCULAR LENS  IMPLANT, BILATERAL    . EYE SURGERY Bilateral    cataract extraction  . INCISION AND DRAINAGE ABSCESS Left 04/28/2018   Procedure: INCISION AND DRAINAGE ABSCESS;  Surgeon: Benjamine Sprague, DO;  Location: [facility] ORS;  Service: General;  Laterality: Left;  . LUMBAR FUSION  2002?  . LUMBAR LAMINECTOMY  1997   titanium in back  . PARTIAL MASTECTOMY WITH AXILLARY SENTINEL LYMPH NODE BIOPSY Left 04/13/2018   Procedure: PARTIAL MASTECTOMY WITH AXILLARY SENTINEL LYMPH NODE BIOPSY  (No Needle Loc);  Surgeon: Benjamine Sprague, DO;  Location: [facility] ORS;  Service: General;  Laterality: Left;  . RETINAL DETACHMENT SURGERY Bilateral 2009  . TUBAL LIGATION      FAMILY HISTORY: family history includes Breast cancer in an other family member; Diabetes in her brother, sister, and sister; Glaucoma in her mother; Heart attack in her father; Hypertension in her mother.  SOCIAL HISTORY:  reports that she has never smoked. She has never used smokeless tobacco. She reports that she does not drink alcohol or use drugs.  ALLERGIES: Shellfish allergy; Iodine; and Sulfa antibiotics  MEDICATIONS:  Current Outpatient  Medications  Medication Sig Dispense Refill  . acetaminophen (TYLENOL) 500 MG tablet Take 500 mg by mouth every 6 (six) hours as needed for moderate pain.     Marland Kitchen aspirin 81 MG EC tablet Take 81 mg by  mouth daily.     Marland Kitchen atorvastatin (LIPITOR) 10 MG tablet Take 10 mg by mouth daily.     . benazepril (LOTENSIN) 40 MG tablet Take 40 mg by mouth daily.     . brimonidine (ALPHAGAN) 0.2 % ophthalmic solution Place 1 drop into the right eye 2 (two) times daily.     . carvedilol (COREG) 12.5 MG tablet Take 12.5 mg by mouth 2 (two) times daily with a meal.     . chlorhexidine (PERIDEX) 0.12 % solution Use as directed 15 mLs in the mouth or throat 2 (two) times daily.   99  . Coenzyme Q10 (COQ10 PO) Take 1 tablet by mouth daily.    . Dakins (HYSEPT) 0.25 % SOLN PLEASE SEE ATTACHED FOR DETAILED DIRECTIONS    . dexamethasone (DECADRON) 4 MG tablet Take 2 tablets (8 mg total) by mouth 2 (two) times daily. Start the day before Taxotere. Then again the day after chemo for 3 days. 30 tablet 1  . dorzolamide-timolol (COSOPT) 22.3-6.8 MG/ML ophthalmic solution Place 1 drop into both eyes 2 (two) times daily.    . ferrous sulfate 325 (65 FE) MG EC tablet Take 1 tablet (325 mg total) by mouth 2 (two) times daily with a meal. 30 tablet 3  . hydrALAZINE (APRESOLINE) 100 MG tablet Take 100 mg by mouth 3 (three) times daily.   3  . ibuprofen (ADVIL,MOTRIN) 800 MG tablet Take 1 tablet (800 mg total) by mouth every 8 (eight) hours as needed for mild pain or moderate pain. 30 tablet 0  . latanoprost (XALATAN) 0.005 % ophthalmic solution Place 1 drop into both eyes at bedtime.     . lidocaine-prilocaine (EMLA) cream Apply to affected area once 30 g 3  . metFORMIN (GLUCOPHAGE) 500 MG tablet Take 500 mg by mouth 2 (two) times daily with a meal.     . Multiple Vitamin (MULTI-VITAMINS) TABS Take 1 tablet by mouth daily.     Marland Kitchen omeprazole (PRILOSEC) 40 MG capsule Take 40 mg by mouth daily.     . prochlorperazine (COMPAZINE) 10 MG tablet Take 1 tablet (10 mg total) by mouth every 6 (six) hours as needed (Nausea or vomiting). 30 tablet 1  . Specialty Vitamins Products (MAGNESIUM, AMINO ACID CHELATE,) 133 MG tablet Take 1 tablet  by mouth daily.    Marland Kitchen trolamine salicylate (ASPERCREME) 10 % cream Apply 1 application topically as needed for muscle pain.    Marland Kitchen ondansetron (ZOFRAN) 8 MG tablet Take 1 tablet (8 mg total) by mouth 2 (two) times daily as needed for refractory nausea / vomiting. Start on day 3 after chemo. 30 tablet 1   No current facility-administered medications for this encounter.     ECOG PERFORMANCE STATUS:  0 - Asymptomatic  REVIEW OF SYSTEMS:  Patient denies any weight loss, fatigue, weakness, fever, chills or night sweats. Patient denies any loss of vision, blurred vision. Patient denies any ringing  of the ears or hearing loss. No irregular heartbeat. Patient denies heart murmur or history of fainting. Patient denies any chest pain or pain radiating to her upper extremities. Patient denies any shortness of breath, difficulty breathing at night, cough or hemoptysis. Patient denies any swelling in the lower legs. Patient denies any nausea  vomiting, vomiting of blood, or coffee ground material in the vomitus. Patient denies any stomach pain. Patient states has had normal bowel movements no significant constipation or diarrhea. Patient denies any dysuria, hematuria or significant nocturia. Patient denies any problems walking, swelling in the joints or loss of balance. Patient denies any skin changes, loss of hair or loss of weight. Patient denies any excessive worrying or anxiety or significant depression. Patient denies any problems with insomnia. Patient denies excessive thirst, polyuria, polydipsia. Patient denies any swollen glands, patient denies easy bruising or easy bleeding. Patient denies any recent infections, allergies or URI. Patient "s visual fields have not changed significantly in recent time.    PHYSICAL EXAM: BP (!) 142/67 (BP Location: Right Arm, Patient Position: Sitting)   Pulse 71   Temp 97.7 F (36.5 C) (Tympanic)   Resp 18   Wt 159 lb 11.6 oz (72.4 kg)   BMI 25.78 kg/m  Left breast has  some slight erythema around the nipple areolar complex. No dominant mass or nodularity is noted noted in either breast in 2 positions examined. No swelling of her left upper extremity is noted.Well-developed well-nourished patient in NAD. HEENT reveals PERLA, EOMI, discs not visualized.  Oral cavity is clear. No oral mucosal lesions are identified. Neck is clear without evidence of cervical or supraclavicular adenopathy. Lungs are clear to A&P. Cardiac examination is essentially unremarkable with regular rate and rhythm without murmur rub or thrill. Abdomen is benign with no organomegaly or masses noted. Motor sensory and DTR levels are equal and symmetric in the upper and lower extremities. Cranial nerves II through XII are grossly intact. Proprioception is intact. No peripheral adenopathy or edema is identified. No motor or sensory levels are noted. Crude visual fields are within normal range.  LABORATORY DATA: pathology reports reviewed    RADIOLOGY RESULTS:mammogram and ultrasound reviewed   IMPRESSION: tage II (T2 N0 M0) invasive mammary carcinoma of left breast status post wide local excision and sentinel node biopsy in 78 year old female.  PLAN: at this time I believe we can go ahead with hypofractionated course of radiation therapy to her right breast over 4 weeks. Based on the close scar would also boost her scar another 1600 cGy using electron beam. Risks and benefits of treatment including skin reaction fatigue alteration of blood counts possible inclusion of superficial lung all were discussed in detail with the patient. Patient seems to comprehend my treatment plan well. I personally will see her back for reevaluation after completion of chemotherapy and make further treatment planning decisions at that time. Patient family know to call at any time.  I would like to take this opportunity to thank you for allowing me to participate in the care of your patient.Noreene Filbert,  MD

## 2018-06-15 ENCOUNTER — Other Ambulatory Visit: Payer: Self-pay

## 2018-06-15 ENCOUNTER — Encounter: Payer: Self-pay | Admitting: Surgery

## 2018-06-15 ENCOUNTER — Inpatient Hospital Stay: Payer: Medicare Other

## 2018-06-15 ENCOUNTER — Ambulatory Visit (INDEPENDENT_AMBULATORY_CARE_PROVIDER_SITE_OTHER): Payer: Medicare Other | Admitting: Surgery

## 2018-06-15 VITALS — BP 150/62 | HR 76 | Temp 97.5°F | Resp 18 | Ht 67.0 in | Wt 157.8 lb

## 2018-06-15 DIAGNOSIS — C50912 Malignant neoplasm of unspecified site of left female breast: Secondary | ICD-10-CM

## 2018-06-15 DIAGNOSIS — Z170 Estrogen receptor positive status [ER+]: Secondary | ICD-10-CM

## 2018-06-15 MED ORDER — CEFAZOLIN SODIUM-DEXTROSE 2-4 GM/100ML-% IV SOLN: 2.0000 g | INTRAVENOUS | Status: DC

## 2018-06-15 NOTE — Patient Instructions (Signed)
You may shower as usual. Lift the left breast up and apply soap and water to the area. Please pat dry. Apply a piece of gauze and apply tape to secure.   We have seen you today and have spoken about your port placement this will be done  at [facility] by Dr. Tama High.    Please call our office with any questions or concerns that you have.   Port-a-Cath [facility]) A central line is a soft, flexible tube (catheter) that can be used to collect blood for testing or to give medicine or nutrition through a vein. The tip of the central line ends in a large vein just above the heart called the vena cava. A central line may be placed because:  You need to get medicines or fluids through an IV tube for a long period of time.  You need nutrition but cannot eat or absorb nutrients.  The veins in your hands or arms are hard to access.  You need to have blood taken often for blood tests.  You need a blood transfusion  You need chemotherapy or dialysis.  There are many types of central lines:  Peripherally inserted central catheter (PICC) line. This type is used for intermediate access to long-term access of one week or more. It can be used to draw blood and give fluids or medicines. A PICC looks like an IV tube, but it goes up the arm to the heart. It is usually inserted in the upper arm and taped in place on the arm.  Tunneled central line. This type is used for long-term therapy and dialysis. It is placed in a large vein in the neck, chest, or groin. A tunneled central line is inserted through a small incision made over the vein and is advanced into the heart. It is tunneled beneath the skin and brought out through a second incision.  Non-tunneled central line. This type is used for short-term access, usually of a maximum of 7 days. It is often used in the emergency department. A non-tunneled central line is inserted in the neck, chest, or groin.  Implanted port. This type is used for long-term  therapy. It can stay in place longer than other types of central lines. An implanted port is normally inserted in the upper chest but can also be placed in the upper arm or in the abdomen. It is inserted and removed with surgery, and it is accessed using a special needle.  The type of central line that you receive depends on how long you will need it, your medical condition, and the condition of your veins. What are the risks? Using any type of central line has risks that you should be aware of, including:  Infection.  A blood clot that blocks the central line or forms in the vein and travels to the heart.  Bleeding from the place where the central line was put in.  Developing a hole or crack within the central line. If this happens, the central line will need to be replaced.  Developing an abnormal heart rhythm (arrhythmia). This is rare.  Central line failure.  Follow these instructions at home: Flushing and cleaning the central line  Follow instructions from the health care provider about flushing and cleaning the central line.  Wear a mask when flushing or cleaning the central line.  Before you flush or clean the central line: ? Wash your hands with soap and water. ? Clean the central line hub with rubbing alcohol.  Insertion site care  Keep the insertion site of your central line clean and dry at all times.  Check your incision or central line site every day for signs of infection. Check for: ? More redness, swelling, or pain. ? More fluid or blood. ? Warmth. ? Pus or a bad smell. General instructions  Follow instructions from your health care provider for the type of device that you have.  If the central line accidentally gets pulled on, make sure: ? The bandage (dressing) is okay. ? There is no bleeding. ? The line has not been pulled out.  Return to your normal activities as told by your health care provider. Ask your health care provider what activities are safe  for you. You may be restricted from lifting or making repetitive arm movements on the side with the catheter.  Do not swim or bathe unless your health care provider approves.  Keep your dressing dry. Your health care provider can instruct you about how to keep your specific type of dressing from getting wet.  Keep all follow-up visits as told by your health care provider. This is important. Contact a health care provider if:  You have more redness, swelling, or pain around your incision.  You have more fluid or blood coming from your incision.  Your incision feels warm to the touch.  You have pus or a bad smell coming from your incision. Get help right away if:  You have: ? Chills. ? A fever. ? Shortness of breath. ? Trouble breathing. ? Chest pain. ? Swelling in your neck, face, chest, or arm on the side of your central line.  You are coughing.  You feel your heart beating rapidly or skipping beats.  You feel dizzy or you faint.  Your incision or central line site has red streaks spreading away from the area.  Your incision or central line site is bleeding and does not stop.  Your central line is difficult to flush or will not flush.  You do not get a blood return from the central line.  Your central line gets loose or comes out.  Your central line gets damaged.  Your catheter leaks when flushed or when fluids are infused into it. This information is not intended to replace advice given to you by your health care provider. Make sure you discuss any questions you have with your health care provider. Document Released: 06/05/2005 Document Revised: 12/12/2015 Document Reviewed: 11/21/2015 Elsevier Interactive Patient Education  2017 [employer].

## 2018-06-15 NOTE — Progress Notes (Signed)
Surgical Clinic History and Physical  Referring provider:  Dion Body, [place] [facility] [place],  [address]  HISTORY OF PRESENT ILLNESS (HPI):  78 y.o. 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 Patient requested consultation with another surgeon and requests to proceed with port placement without delay to her scheduled chemotherapy education tomorrow morning and initiation of chemotherapy this Friday. She denies any prior indwelling central venous catheters, blood clots, or unilateral upper extremity swelling. She describes Left axillary pain over the past 2 days and concern regarding faint odor from under her Left breast, both without any surrounding redness or drainage, fever/chills.  PAST MEDICAL HISTORY (PMH):  Past Medical History:  Diagnosis Date  . Anemia    chronic microcytic anemia  . Cancer ([place]) 2019   left breast  . Diabetes mellitus without complication ([place])   . GERD (gastroesophageal reflux disease)   . Glaucoma   . Hypercholesterolemia   . Hypertension     PAST SURGICAL HISTORY ([place]):  Past Surgical History:  Procedure Laterality Date  . BACK SURGERY    . BREAST BIOPSY Right    stereo- neg  . BREAST BIOPSY Left 02/2018  . BREAST EXCISIONAL BIOPSY Left 04/03/2018   left breast cancer lumpectomy   . BREAST LUMPECTOMY Left 04/03/2018  . CATARACT EXTRACTION W/ INTRAOCULAR LENS  IMPLANT, BILATERAL    . EYE SURGERY Bilateral    cataract extraction  . INCISION AND DRAINAGE ABSCESS Left 04/28/2018   Procedure: INCISION AND DRAINAGE ABSCESS;  Surgeon: Benjamine Sprague, DO;  Location: [facility] ORS;  Service: General;  Laterality: Left;  . LUMBAR FUSION  2002?  . LUMBAR LAMINECTOMY  1997   titanium in  back  . PARTIAL MASTECTOMY WITH AXILLARY SENTINEL LYMPH NODE BIOPSY Left 04/13/2018   Procedure: PARTIAL MASTECTOMY WITH AXILLARY SENTINEL LYMPH NODE BIOPSY  (No Needle Loc);  Surgeon: Benjamine Sprague, DO;  Location: [facility] ORS;  Service: General;  Laterality: Left;  . RETINAL DETACHMENT SURGERY Bilateral 2009  . TUBAL LIGATION      MEDICATIONS:  Prior to Admission medications   Medication Sig Start Date End Date Taking? Authorizing Provider  acetaminophen (TYLENOL) 500 MG tablet Take 500 mg by mouth every 6 (six) hours as needed for moderate pain.  11/04/16  Yes [provider]  aspirin 81 MG EC tablet Take 81 mg by mouth daily.    Yes [provider]  atorvastatin (LIPITOR) 10 MG tablet Take 10 mg by mouth every evening.  10/16/16  Yes [provider]  benazepril (LOTENSIN) 40 MG tablet Take 40 mg by mouth daily.  09/11/17 09/11/18 Yes [provider]  brimonidine (ALPHAGAN) 0.2 % ophthalmic solution Place 1 drop into the right eye 2 (two) times daily.    Yes [provider]  carvedilol (COREG) 12.5 MG tablet Take 12.5 mg by mouth 2 (two) times daily with a meal.    Yes [provider]  chlorhexidine (PERIDEX) 0.12 % solution Use as directed 15 mLs in the mouth or throat 2 (two) times daily.  09/07/17  Yes [provider]  Coenzyme Q10 (COQ10 PO) Take 1 tablet by mouth daily.   Yes [provider]  dexamethasone (DECADRON) 4 MG tablet Take 2 tablets (8 mg total) by mouth 2 (two) times daily. Start the  day before Taxotere. Then again the day after chemo for 3 days. 06/11/18  Yes Earlie Server, MD  docusate sodium (COLACE) 100 MG capsule Take 100 mg by mouth daily as needed for mild constipation.   Yes [provider]  dorzolamide-timolol (COSOPT) 22.3-6.8 MG/ML ophthalmic solution Place 1 drop into both eyes 2 (two) times daily. 03/12/18  Yes [provider]  ferrous sulfate 325 (65 FE) MG EC tablet Take 1 tablet (325 mg  total) by mouth 2 (two) times daily with a meal. Patient taking differently: Take 325 mg by mouth daily.  06/03/18  Yes Earlie Server, MD  hydrALAZINE (APRESOLINE) 100 MG tablet Take 100 mg by mouth 3 (three) times daily.  02/04/18  Yes [provider]  ibuprofen (ADVIL,MOTRIN) 800 MG tablet Take 1 tablet (800 mg total) by mouth every 8 (eight) hours as needed for mild pain or moderate pain. 04/13/18  Yes Sakai, Isami, DO  latanoprost (XALATAN) 0.005 % ophthalmic solution Place 1 drop into both eyes at bedtime.  01/30/17  Yes [provider]  lidocaine-prilocaine (EMLA) cream Apply to affected area once Patient taking differently: Apply 1 application topically daily as needed (port access).  06/11/18  Yes Earlie Server, MD  metFORMIN (GLUCOPHAGE) 500 MG tablet Take 500 mg by mouth 2 (two) times daily with a meal.  10/23/16  Yes [provider]  Multiple Vitamin (MULTI-VITAMINS) TABS Take 1 tablet by mouth daily.    Yes [provider]  omeprazole (PRILOSEC) 40 MG capsule Take 40 mg by mouth daily.  12/02/16  Yes [provider]  ondansetron (ZOFRAN) 8 MG tablet Take 1 tablet (8 mg total) by mouth 2 (two) times daily as needed for refractory nausea / vomiting. Start on day 3 after chemo. 06/14/18  Yes Earlie Server, MD  prochlorperazine (COMPAZINE) 10 MG tablet Take 1 tablet (10 mg total) by mouth every 6 (six) hours as needed (Nausea or vomiting). 06/11/18  Yes Earlie Server, MD  Specialty Vitamins Products (MAGNESIUM, AMINO ACID CHELATE,) 133 MG tablet Take 1 tablet by mouth daily.   Yes [provider]  trolamine salicylate (ASPERCREME) 10 % cream Apply 1 application topically as needed for muscle pain.   Yes [provider]    ALLERGIES:  Allergies  Allergen Reactions  . Shellfish Allergy Anaphylaxis  . Iodine     Patient unsure if allergic to topical betadine Shellfish allergy with ingestion  . Sulfa Antibiotics Itching    SOCIAL HISTORY:  Social History    Socioeconomic History  . Marital status: Married    Spouse name: [name]  . Number of children: Not on file  . Years of education: Not on file  . Highest education level: Not on file  Occupational History  . Occupation: Restaurant manager, fast food estate  Social Needs  . Financial resource strain: Not on file  . Food insecurity:    Worry: Not on file    Inability: Not on file  . Transportation needs:    Medical: Not on file    Non-medical: Not on file  Tobacco Use  . Smoking status: Never Smoker  . Smokeless tobacco: Never Used  Substance and Sexual Activity  . Alcohol use: Never    Frequency: Never  . Drug use: Never  . Sexual activity: Not on file  Lifestyle  . Physical activity:    Days per week: Not on file    Minutes per session: Not on file  . Stress: Not on file  Relationships  . Social  connections:    Talks on phone: Not on file    Gets together: Not on file    Attends religious service: Not on file    Active member of club or organization: Not on file    Attends meetings of clubs or organizations: Not on file    Relationship status: Not on file  . Intimate partner violence:    Fear of current or ex partner: Not on file    Emotionally abused: Not on file    Physically abused: Not on file    Forced sexual activity: Not on file  Other Topics Concern  . Not on file  Social History Narrative  . Not on file    The patient currently resides (home / rehab facility / nursing home): Home The patient normally is (ambulatory / bedbound): Ambulatory  FAMILY HISTORY:  Family History  Problem Relation Age of Onset  . Diabetes Sister   . Diabetes Brother   . Diabetes Sister   . Breast cancer Other   . Hypertension Mother   . Glaucoma Mother   . Heart attack Father     Otherwise negative/non-contributory.  REVIEW OF SYSTEMS:  Constitutional: denies any other weight loss, fever, chills, or sweats  Eyes: denies any other vision changes, history of eye injury  ENT: denies  sore throat, hearing problems  Respiratory: denies shortness of breath, wheezing  Cardiovascular: denies chest pain, palpitations Breasts: Left axillary  Gastrointestinal: denies abdominal pain, N/V, or diarrhea Musculoskeletal: denies any other joint pains or cramps  Skin: Denies any other rashes or skin discolorations except as per HPI Neurological: denies any other headache, dizziness, weakness  Psychiatric: Denies any other depression, anxiety   All other review of systems were otherwise negative   VITAL SIGNS:  BP (!) 150/62   Pulse 76   Temp (!) 97.5 F (36.4 C) (Temporal)   Resp 18   Ht _0  (1.702 m)   Wt 157 lb 12.8 oz (71.6 kg)   SpO2 98%   BMI 24.71 kg/m    PHYSICAL EXAM:  Constitutional:  -- Normal body habitus  -- Awake, alert, and oriented x3  Eyes:  -- Pupils equally round and reactive to light  -- No scleral icterus  Ear, nose, throat:  -- No jugular venous distension -- No nasal drainage, bleeding Pulmonary:  -- No crackles  -- Equal breath sounds bilaterally -- Breathing non-labored at rest Cardiovascular:  -- S1, S2 present  -- No pericardial rubs  Breasts: -- Left breast inframammary crease post-surgical incision well-approximated with small amount of bleeding at edge of incision when wiped with a dry gauze sponge, non-tender to palpation with no surrounding erythema or drainage, small amount of moisture along inframammary crease and thin white film, no odor appreciated -- Left axillary post-surgical wound well-approximated without surrounding erythema or drainage, tender to even gentle palpation without fluctuance or axillary adenopathy Gastrointestinal:  -- Abdomen soft, non-tender to palpation, non-distended, no guarding/rebound tenderness -- No abdominal masses appreciated, pulsatile or otherwise  Musculoskeletal and Integumentary:  -- Wounds or skin discoloration: None appreciated except post-surgical wounds as described above (Breast) --  Extremities: B/L UE and LE FROM, hands and feet warm, no edema  Neurologic:  -- Motor function: Intact and symmetric -- Sensation: Intact and symmetric  Labs:  CBC Latest Ref Rng & Units 06/02/2018 05/02/2018 05/01/2018  WBC 4.0 - 10.5 K/uL 5.5 5.0 5.1  Hemoglobin 12.0 - 15.0 g/dL 10.3(L) 9.1(L) 8.5(L)  Hematocrit 36.0 - 46.0 % 31.8(L)  27.2(L) 26.3(L)  Platelets 150 - 400 K/uL 334 329 296   CMP Latest Ref Rng & Units 05/02/2018 05/01/2018 04/30/2018  Glucose 70 - 99 mg/dL 126(H) 153(H) 158(H)  BUN 8 - 23 mg/dL _0 Creatinine 0.44 - 1.00 mg/dL 0.74 0.79 0.76  Sodium 135 - 145 mmol/L 129(L) 130(L) 129(L)  Potassium 3.5 - 5.1 mmol/L 3.9 3.9 3.7  Chloride 98 - 111 mmol/L 99 101 99  CO2 22 - 32 mmol/L 22 23 21(L)  Calcium 8.9 - 10.3 mg/dL 8.7(L) 8.4(L) 8.8(L)  Total Protein 6.5 - 8.1 g/dL - - -  Total Bilirubin 0.3 - 1.2 mg/dL - - -  Alkaline Phos 38 - 126 U/L - - -  AST 15 - 41 U/L - - -  ALT 0 - 44 U/L - - -   Imaging studies:  Chest X-ray (04/25/2019) The heart size and mediastinal contours are within normal limits. Both lungs are clear. The visualized skeletal structures are unremarkable.   Assessment/Plan:  78 y.o. 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   - chemotherapy teaching confirmed for tomorrow   - all risks, benefits, and alternatives to insertion of central venous catheter with subcutaneous port were discussed with the patient and her husband, all of their questions were answered to their expressed satisfaction, patient expresses she wishes to proceed, and informed consent was obtained.  - will plan to proceed with placement of central venous catheter with subcutaneous port for chemotherapy tomorrow pending OR availability   - anticipate  return to clinic 2 weeks following above planned procedure  - instructed to call if any questions or concerns  All of the above recommendations were discussed with the patient and patient's husband, and all of patient's and family's questions were answered to their expressed satisfaction.  Thank you for the opportunity to participate in this patient's care.  -- Marilynne Drivers Rosana Hoes, MD, [place]: [facility] - Partnering for exceptional care. Office: [phone]

## 2018-06-16 ENCOUNTER — Encounter: Payer: Self-pay | Admitting: Anesthesiology

## 2018-06-16 ENCOUNTER — Ambulatory Visit: Payer: Medicare Other | Admitting: Anesthesiology

## 2018-06-16 ENCOUNTER — Encounter
Admission: RE | Disposition: A | Discharge status: Home / Self Care | Payer: Self-pay | Source: Home / Self Care | Attending: Surgery

## 2018-06-16 ENCOUNTER — Other Ambulatory Visit: Payer: Self-pay

## 2018-06-16 ENCOUNTER — Ambulatory Visit
Admission: RE | Admit: 2018-06-16 | Discharge: 2018-06-16 | Disposition: A | Discharge status: Home / Self Care | Payer: Medicare Other | Attending: Surgery | Admitting: Surgery

## 2018-06-16 ENCOUNTER — Telehealth: Payer: Self-pay | Admitting: *Deleted

## 2018-06-16 ENCOUNTER — Inpatient Hospital Stay: Payer: Medicare Other

## 2018-06-16 DIAGNOSIS — H409 Unspecified glaucoma: Secondary | ICD-10-CM | POA: Insufficient documentation

## 2018-06-16 DIAGNOSIS — Z803 Family history of malignant neoplasm of breast: Secondary | ICD-10-CM | POA: Diagnosis not present

## 2018-06-16 DIAGNOSIS — Z7982 Long term (current) use of aspirin: Secondary | ICD-10-CM | POA: Insufficient documentation

## 2018-06-16 DIAGNOSIS — Z981 Arthrodesis status: Secondary | ICD-10-CM | POA: Diagnosis not present

## 2018-06-16 DIAGNOSIS — Z7984 Long term (current) use of oral hypoglycemic drugs: Secondary | ICD-10-CM | POA: Diagnosis not present

## 2018-06-16 DIAGNOSIS — K219 Gastro-esophageal reflux disease without esophagitis: Secondary | ICD-10-CM | POA: Diagnosis not present

## 2018-06-16 DIAGNOSIS — Z9842 Cataract extraction status, left eye: Secondary | ICD-10-CM | POA: Diagnosis not present

## 2018-06-16 DIAGNOSIS — E7800 Pure hypercholesterolemia, unspecified: Secondary | ICD-10-CM | POA: Insufficient documentation

## 2018-06-16 DIAGNOSIS — C50912 Malignant neoplasm of unspecified site of left female breast: Secondary | ICD-10-CM | POA: Diagnosis not present

## 2018-06-16 DIAGNOSIS — E119 Type 2 diabetes mellitus without complications: Secondary | ICD-10-CM | POA: Diagnosis not present

## 2018-06-16 DIAGNOSIS — Z882 Allergy status to sulfonamides status: Secondary | ICD-10-CM | POA: Diagnosis not present

## 2018-06-16 DIAGNOSIS — Z833 Family history of diabetes mellitus: Secondary | ICD-10-CM | POA: Diagnosis not present

## 2018-06-16 DIAGNOSIS — Z91013 Allergy to seafood: Secondary | ICD-10-CM | POA: Insufficient documentation

## 2018-06-16 DIAGNOSIS — Z820 Family history of epilepsy and other diseases of the nervous system: Secondary | ICD-10-CM | POA: Insufficient documentation

## 2018-06-16 DIAGNOSIS — I10 Essential (primary) hypertension: Secondary | ICD-10-CM | POA: Diagnosis not present

## 2018-06-16 DIAGNOSIS — Z9841 Cataract extraction status, right eye: Secondary | ICD-10-CM | POA: Insufficient documentation

## 2018-06-16 DIAGNOSIS — Z79899 Other long term (current) drug therapy: Secondary | ICD-10-CM | POA: Diagnosis not present

## 2018-06-16 DIAGNOSIS — Z5309 Procedure and treatment not carried out because of other contraindication: Secondary | ICD-10-CM | POA: Insufficient documentation

## 2018-06-16 DIAGNOSIS — M79622 Pain in left upper arm: Secondary | ICD-10-CM | POA: Diagnosis not present

## 2018-06-16 DIAGNOSIS — Z8249 Family history of ischemic heart disease and other diseases of the circulatory system: Secondary | ICD-10-CM | POA: Diagnosis not present

## 2018-06-16 DIAGNOSIS — Z91041 Radiographic dye allergy status: Secondary | ICD-10-CM | POA: Diagnosis not present

## 2018-06-16 DIAGNOSIS — Z961 Presence of intraocular lens: Secondary | ICD-10-CM | POA: Insufficient documentation

## 2018-06-16 DIAGNOSIS — Z170 Estrogen receptor positive status [ER+]: Secondary | ICD-10-CM

## 2018-06-16 LAB — GLUCOSE, CAPILLARY: Glucose-Capillary: 141 mg/dL — ABNORMAL HIGH (ref 70–99)

## 2018-06-16 SURGERY — INSERTION, TUNNELED CENTRAL VENOUS DEVICE, WITH PORT
Anesthesia: General

## 2018-06-16 MED ORDER — CHLORHEXIDINE GLUCONATE CLOTH 2 % EX PADS: 6.0000 | MEDICATED_PAD | Freq: Once | CUTANEOUS | Status: DC

## 2018-06-16 MED ORDER — CEPHALEXIN 500 MG PO CAPS: 500.0000 mg | ORAL_CAPSULE | Freq: Four times a day (QID) | ORAL | 0 refills | Status: DC

## 2018-06-16 MED ORDER — LIDOCAINE HCL (PF) 2 % IJ SOLN
INTRAMUSCULAR | Status: AC
  Filled 2018-06-16: qty 10

## 2018-06-16 MED ORDER — FENTANYL CITRATE (PF) 100 MCG/2ML IJ SOLN
INTRAMUSCULAR | Status: AC
  Filled 2018-06-16: qty 2

## 2018-06-16 MED ORDER — PROPOFOL 500 MG/50ML IV EMUL
INTRAVENOUS | Status: AC
  Filled 2018-06-16: qty 50

## 2018-06-16 MED ORDER — BUPIVACAINE HCL (PF) 0.5 % IJ SOLN
INTRAMUSCULAR | Status: AC
  Filled 2018-06-16: qty 30

## 2018-06-16 MED ORDER — ACETAMINOPHEN 500 MG PO TABS: 1000.0000 mg | ORAL_TABLET | ORAL | Status: DC

## 2018-06-16 MED ORDER — ACETAMINOPHEN 500 MG PO TABS
ORAL_TABLET | ORAL | Status: AC
  Filled 2018-06-16: qty 2

## 2018-06-16 MED ORDER — LIDOCAINE HCL (PF) 1 % IJ SOLN
INTRAMUSCULAR | Status: AC
  Filled 2018-06-16: qty 30

## 2018-06-16 MED ORDER — HEPARIN SOD (PORK) LOCK FLUSH 100 UNIT/ML IV SOLN
INTRAVENOUS | Status: AC
  Filled 2018-06-16: qty 5

## 2018-06-16 MED ORDER — CEFAZOLIN SODIUM-DEXTROSE 2-4 GM/100ML-% IV SOLN
INTRAVENOUS | Status: AC
  Filled 2018-06-16: qty 100

## 2018-06-16 MED ORDER — MIDAZOLAM HCL 2 MG/2ML IJ SOLN
INTRAMUSCULAR | Status: AC
  Filled 2018-06-16: qty 2

## 2018-06-16 SURGICAL SUPPLY — 27 items
BAG DECANTER FOR FLEXI CONT (MISCELLANEOUS) ×2 IMPLANT
BLADE SURG SZ11 CARB STEEL (BLADE) ×2 IMPLANT
CHLORAPREP W/TINT 26ML (MISCELLANEOUS) ×4 IMPLANT
COVER LIGHT HANDLE STERIS (MISCELLANEOUS) ×4 IMPLANT
COVER PROBE FLX POLY STRL (MISCELLANEOUS) ×2 IMPLANT
COVER WAND RF STERILE (DRAPES) ×2 IMPLANT
DECANTER SPIKE VIAL GLASS SM (MISCELLANEOUS) ×4 IMPLANT
DERMABOND ADVANCED (GAUZE/BANDAGES/DRESSINGS) ×1
DERMABOND ADVANCED .7 DNX12 (GAUZE/BANDAGES/DRESSINGS) ×1 IMPLANT
DRAPE C-ARM 42X70 (DRAPES) ×2 IMPLANT
DRAPE LAPAROTOMY 77X122 PED (DRAPES) ×2 IMPLANT
ELECT REM PT RETURN 9FT ADLT (ELECTROSURGICAL) ×2
ELECTRODE REM PT RTRN 9FT ADLT (ELECTROSURGICAL) ×1 IMPLANT
GLOVE BIO SURGEON STRL SZ7 (GLOVE) ×2 IMPLANT
GLOVE BIOGEL PI IND STRL 7.5 (GLOVE) ×1 IMPLANT
GLOVE BIOGEL PI INDICATOR 7.5 (GLOVE) ×1
GOWN STRL REUS W/TWL LRG LVL3 (GOWN DISPOSABLE) ×4 IMPLANT
IV NS 500ML (IV SOLUTION) ×1
IV NS 500ML BAXH (IV SOLUTION) ×1 IMPLANT
KIT TURNOVER KIT A (KITS) ×2 IMPLANT
NEEDLE HYPO 25X1 1.5 SAFETY (NEEDLE) ×2 IMPLANT
PACK BASIN MINOR ARMC (MISCELLANEOUS) ×2 IMPLANT
SUT MNCRL AB 4-0 PS2 18 (SUTURE) ×2 IMPLANT
SUT VIC AB 3-0 SH 27 (SUTURE) ×1
SUT VIC AB 3-0 SH 27X BRD (SUTURE) ×1 IMPLANT
SYR 10ML LL (SYRINGE) ×2 IMPLANT
SYR 20CC LL (SYRINGE) ×2 IMPLANT

## 2018-06-16 NOTE — Anesthesia Preprocedure Evaluation (Signed)
Anesthesia Evaluation  Patient identified by MRN, date of birth, ID band Patient awake    Reviewed: Allergy & Precautions, NPO status , Patient's Chart, lab work & pertinent test results  History of Anesthesia Complications Negative for: history of anesthetic complications  Airway Mallampati: II  TM Distance: >3 FB Neck ROM: Full    Dental no notable dental hx.    Pulmonary neg pulmonary ROS, neg sleep apnea, neg COPD,    breath sounds clear to auscultation- rhonchi (-) wheezing      Cardiovascular Exercise Tolerance: Good hypertension, Pt. on medications (-) CAD, (-) Past MI, (-) Cardiac Stents and (-) CABG  Rhythm:Regular Rate:Normal - Systolic murmurs and - Diastolic murmurs    Neuro/Psych neg Seizures negative neurological ROS  negative psych ROS   GI/Hepatic Neg liver ROS, GERD  ,  Endo/Other  diabetes, Oral Hypoglycemic Agents  Renal/GU negative Renal ROS     Musculoskeletal negative musculoskeletal ROS (+)   Abdominal (+) - obese,   Peds  Hematology  (+) anemia ,   Anesthesia Other Findings Past Medical History: No date: Anemia     Comment:  chronic microcytic anemia 2019: Cancer ([facility])     Comment:  left breast No date: Diabetes mellitus without complication ([facility]) No date: GERD (gastroesophageal reflux disease) No date: Glaucoma No date: Hypercholesterolemia No date: Hypertension   Reproductive/Obstetrics                             Anesthesia Physical Anesthesia Plan  ASA: III  Anesthesia Plan: General   Post-op Pain Management:    Induction: Intravenous  PONV Risk Score and Plan: 2 and Propofol infusion  Airway Management Planned: Natural Airway  Additional Equipment:   Intra-op Plan:   Post-operative Plan:   Informed Consent: I have reviewed the patients History and Physical, chart, labs and discussed the procedure including the risks, benefits and  alternatives for the proposed anesthesia with the patient or authorized representative who has indicated his/her understanding and acceptance.     Dental advisory given  Plan Discussed with: CRNA and Anesthesiologist  Anesthesia Plan Comments:         Anesthesia Quick Evaluation

## 2018-06-16 NOTE — Telephone Encounter (Signed)
Patient called the office back and appointment scheduled for 06-22-18 at 2:45 pm with Dr. Rosana Hoes.

## 2018-06-16 NOTE — Telephone Encounter (Signed)
Message left on home and cell numbers for patient to call the office.   Per Dr. Rosana Hoes, he was unable to do patient's surgery today. We need to get her scheduled for a follow up appointment with Dr. Rosana Hoes for Tuesday, 06-22-18.   *Surgery placed in the depot.

## 2018-06-16 NOTE — Progress Notes (Addendum)
Patient presents today for placement of central venous catheter with subcutaneous port for adjuvant chemotherapy treatment of Left breast cancer. Patient previously with another surgeon underwent Left breast lumpectomy and sentinel lymph node excisional biopsy (04/13/2018), after which she developed a large Left breast abscess, treated with incision, drainage, wound VAC, and antibiotics. Following recovery, she yesterday presented to request placement of her central venous catheter with subcutaneous port for adjuvant chemotherapy. When evaluated yesterday, her Left inframammary crease breast incision appeared to be healing well, while her Left axillary incision was tender to palpation with scant reactive-appearing peri-incisional blush.   Unfortunately, upon presentation today for her port placement, her Left axillary pain and erythema have worsened significantly (without fluctuance), for which her port placement and chemotherapy have been cancelled. [facility] office will contact patient to schedule follow-up appointment next week, and Dr. Tasia Catchings has been updated accordingly, agrees with plan.  Patient and her husband express understanding and agreement, were instructed to call if any questions or concerns.  -- Marilynne Drivers Rosana Hoes, MD, [place]: [facility] - Partnering for exceptional care. Office: [phone]

## 2018-06-16 NOTE — OR Nursing (Signed)
Pt arrived via wheelchair from home, pt c/o increased soreness and redness under left arm. Swelling and tenderness noted under left arm. Pt c/o difficulty raising left arm. Dr Rosana Hoes notified and evaluated left underarm, pt surgery cancelled per Dr. Rosana Hoes, left underarm incision suspect of possible infection. Antibiotics prescribed for pt and sent to [facility].

## 2018-06-17 ENCOUNTER — Ambulatory Visit: Payer: Medicare Other

## 2018-06-17 ENCOUNTER — Ambulatory Visit: Payer: Medicare Other | Admitting: Oncology

## 2018-06-17 ENCOUNTER — Other Ambulatory Visit: Payer: Medicare Other

## 2018-06-18 ENCOUNTER — Inpatient Hospital Stay: Payer: Medicare Other

## 2018-06-18 ENCOUNTER — Telehealth: Payer: Self-pay | Admitting: *Deleted

## 2018-06-18 ENCOUNTER — Inpatient Hospital Stay: Payer: Medicare Other | Admitting: Oncology

## 2018-06-18 NOTE — Telephone Encounter (Signed)
Patient reports that she did not have port placed due to having an infection in the axilla. She is complaining of pain and asking what she can take. She also cannot afford the Zofran because insurance will not cover it and it is too expensive. Please advise

## 2018-06-19 ENCOUNTER — Other Ambulatory Visit: Payer: Self-pay

## 2018-06-19 ENCOUNTER — Emergency Department: Payer: Medicare Other

## 2018-06-19 ENCOUNTER — Inpatient Hospital Stay
Admission: EM | Admit: 2018-06-19 | Discharge: 2018-06-22 | DRG: 585 | Disposition: A | Discharge status: Home Health | Payer: Medicare Other | Attending: General Surgery | Admitting: General Surgery

## 2018-06-19 DIAGNOSIS — N61 Mastitis without abscess: Secondary | ICD-10-CM

## 2018-06-19 DIAGNOSIS — Z833 Family history of diabetes mellitus: Secondary | ICD-10-CM

## 2018-06-19 DIAGNOSIS — Z83511 Family history of glaucoma: Secondary | ICD-10-CM

## 2018-06-19 DIAGNOSIS — I10 Essential (primary) hypertension: Secondary | ICD-10-CM | POA: Diagnosis present

## 2018-06-19 DIAGNOSIS — D509 Iron deficiency anemia, unspecified: Secondary | ICD-10-CM | POA: Diagnosis present

## 2018-06-19 DIAGNOSIS — Z7982 Long term (current) use of aspirin: Secondary | ICD-10-CM

## 2018-06-19 DIAGNOSIS — H409 Unspecified glaucoma: Secondary | ICD-10-CM | POA: Diagnosis present

## 2018-06-19 DIAGNOSIS — Z8249 Family history of ischemic heart disease and other diseases of the circulatory system: Secondary | ICD-10-CM

## 2018-06-19 DIAGNOSIS — Z888 Allergy status to other drugs, medicaments and biological substances status: Secondary | ICD-10-CM

## 2018-06-19 DIAGNOSIS — N611 Abscess of the breast and nipple: Secondary | ICD-10-CM | POA: Diagnosis present

## 2018-06-19 DIAGNOSIS — N644 Mastodynia: Secondary | ICD-10-CM | POA: Diagnosis not present

## 2018-06-19 DIAGNOSIS — E119 Type 2 diabetes mellitus without complications: Secondary | ICD-10-CM | POA: Diagnosis present

## 2018-06-19 DIAGNOSIS — K219 Gastro-esophageal reflux disease without esophagitis: Secondary | ICD-10-CM | POA: Diagnosis present

## 2018-06-19 DIAGNOSIS — Z7984 Long term (current) use of oral hypoglycemic drugs: Secondary | ICD-10-CM

## 2018-06-19 DIAGNOSIS — Z853 Personal history of malignant neoplasm of breast: Secondary | ICD-10-CM

## 2018-06-19 DIAGNOSIS — Z91013 Allergy to seafood: Secondary | ICD-10-CM

## 2018-06-19 DIAGNOSIS — Z803 Family history of malignant neoplasm of breast: Secondary | ICD-10-CM

## 2018-06-19 DIAGNOSIS — Z79899 Other long term (current) drug therapy: Secondary | ICD-10-CM

## 2018-06-19 DIAGNOSIS — E785 Hyperlipidemia, unspecified: Secondary | ICD-10-CM | POA: Diagnosis present

## 2018-06-19 DIAGNOSIS — Z882 Allergy status to sulfonamides status: Secondary | ICD-10-CM

## 2018-06-19 DIAGNOSIS — Z9012 Acquired absence of left breast and nipple: Secondary | ICD-10-CM

## 2018-06-19 MED ORDER — SODIUM CHLORIDE 0.9 % IV BOLUS
500.0000 mL | Freq: Once | INTRAVENOUS | Status: AC
  Administered 2018-06-19: 500 mL via INTRAVENOUS

## 2018-06-19 MED ORDER — MORPHINE SULFATE (PF) 4 MG/ML IV SOLN
4.0000 mg | Freq: Once | INTRAVENOUS | Status: AC
  Administered 2018-06-19: 4 mg via INTRAVENOUS
  Filled 2018-06-19: qty 1

## 2018-06-19 MED ORDER — ONDANSETRON HCL 4 MG/2ML IJ SOLN
4.0000 mg | Freq: Once | INTRAMUSCULAR | Status: AC
  Administered 2018-06-19: 4 mg via INTRAVENOUS
  Filled 2018-06-19: qty 2

## 2018-06-19 MED ORDER — SODIUM CHLORIDE 0.9 % IV SOLN
2.0000 g | INTRAVENOUS | Status: DC
  Administered 2018-06-19: 2 g via INTRAVENOUS
  Filled 2018-06-19 (×2): qty 20

## 2018-06-19 MED ORDER — VANCOMYCIN HCL IN DEXTROSE 1-5 GM/200ML-% IV SOLN
1000.0000 mg | Freq: Once | INTRAVENOUS | Status: AC
  Administered 2018-06-20: 1000 mg via INTRAVENOUS
  Filled 2018-06-19: qty 200

## 2018-06-19 NOTE — ED Notes (Signed)
Pt taking her own cephalexin as allowed by MD; pt understands nothing to eat until seen by provider

## 2018-06-19 NOTE — ED Triage Notes (Signed)
Abscess L axilla x 2 days. States is not getting chemo currently.

## 2018-06-19 NOTE — ED Provider Notes (Addendum)
[facility] Emergency Department Provider Note   ____________________________________________   First MD Initiated Contact with Patient 06/19/18 2148     (approximate)  I have reviewed the triage vital signs and the nursing notes.   HISTORY  Chief Complaint Abscess    HPI Jackie Allison is a 78 y.o. female here for evaluation of left breast pain and suspected abscess  Patient has had a recently complicated course involving left breast surgery, revision, and saw Dr. Rosana Hoes and start antibiotics  She reports her symptoms are clearly worsening.  She reports a severe pain under the left breast area and she is also felt a small amount of drainage.  She also had a large incision underneath the left breast.  Has seen general surgery, had planned to have a port inserted but could not have it completed due to this associated infection just a couple days ago  Reports she feels fevers and chills no measured temp.  No nausea or vomiting.  No headache.  No chest pain except over the left armpit area and the redness is extended from that small area now all the way out over the left breast.  Previous infection in the same place.   Past Medical History:  Diagnosis Date  . Anemia    chronic microcytic anemia  . Cancer ([place]) 2019   left breast  . Diabetes mellitus without complication ([place])   . GERD (gastroesophageal reflux disease)   . Glaucoma   . Hypercholesterolemia   . Hypertension     Patient Active Problem List   Diagnosis Date Noted  . Breast cancer in female [facility]) 04/28/2018  . Lymphedema 02/14/2018  . 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 Hyperlipidemia 02/11/2018  . Diabetes ([place]) 02/11/2018    Past Surgical History:  Procedure Laterality Date  . BACK SURGERY    . BREAST BIOPSY Right    stereo- neg  . BREAST BIOPSY Left 02/2018  . BREAST EXCISIONAL BIOPSY Left 04/03/2018   left breast cancer lumpectomy   . BREAST LUMPECTOMY Left 04/03/2018  .  CATARACT EXTRACTION W/ INTRAOCULAR LENS  IMPLANT, BILATERAL    . EYE SURGERY Bilateral    cataract extraction  . INCISION AND DRAINAGE ABSCESS Left 04/28/2018   Procedure: INCISION AND DRAINAGE ABSCESS;  Surgeon: Benjamine Sprague, DO;  Location: [facility] ORS;  Service: General;  Laterality: Left;  . LUMBAR FUSION  2002?  . LUMBAR LAMINECTOMY  1997   titanium in back  . PARTIAL MASTECTOMY WITH AXILLARY SENTINEL LYMPH NODE BIOPSY Left 04/13/2018   Procedure: PARTIAL MASTECTOMY WITH AXILLARY SENTINEL LYMPH NODE BIOPSY  (No Needle Loc);  Surgeon: Benjamine Sprague, DO;  Location: [facility] ORS;  Service: General;  Laterality: Left;  . RETINAL DETACHMENT SURGERY Bilateral 2009  . TUBAL LIGATION      Prior to Admission medications   Medication Sig Start Date End Date Taking? Authorizing Provider  acetaminophen (TYLENOL) 500 MG tablet Take 500 mg by mouth every 6 (six) hours as needed for moderate pain.  11/04/16   [provider]  aspirin 81 MG EC tablet Take 81 mg by mouth daily.     [provider]  atorvastatin (LIPITOR) 10 MG tablet Take 10 mg by mouth every evening.  10/16/16   [provider]  benazepril (LOTENSIN) 40 MG tablet Take 40 mg by mouth daily.  09/11/17 09/11/18  [provider]  brimonidine (ALPHAGAN) 0.2 % ophthalmic solution Place 1 drop into the right eye 2 (two) times daily.  [provider]  carvedilol (COREG) 12.5 MG tablet Take 12.5 mg by mouth 2 (two) times daily with a meal.     [provider]  cephALEXin (KEFLEX) 500 MG capsule Take 1 capsule (500 mg total) by mouth 4 (four) times daily for 10 days. 06/16/18 06/26/18  Vickie Epley, MD  chlorhexidine (PERIDEX) 0.12 % solution Use as directed 15 mLs in the mouth or throat 2 (two) times daily.  09/07/17   [provider]  Coenzyme Q10 (COQ10 PO) Take 1 tablet by mouth daily.    [provider]  dexamethasone (DECADRON) 4 MG tablet Take 2 tablets (8 mg total) by mouth 2  (two) times daily. Start the day before Taxotere. Then again the day after chemo for 3 days. 06/11/18   Earlie Server, MD  docusate sodium (COLACE) 100 MG capsule Take 100 mg by mouth daily as needed for mild constipation.    [provider]  dorzolamide-timolol (COSOPT) 22.3-6.8 MG/ML ophthalmic solution Place 1 drop into both eyes 2 (two) times daily. 03/12/18   [provider]  ferrous sulfate 325 (65 FE) MG EC tablet Take 1 tablet (325 mg total) by mouth 2 (two) times daily with a meal. Patient taking differently: Take 325 mg by mouth daily.  06/03/18   Earlie Server, MD  hydrALAZINE (APRESOLINE) 100 MG tablet Take 100 mg by mouth 3 (three) times daily.  02/04/18   [provider]  ibuprofen (ADVIL,MOTRIN) 800 MG tablet Take 1 tablet (800 mg total) by mouth every 8 (eight) hours as needed for mild pain or moderate pain. 04/13/18   Lysle Pearl, Isami, DO  latanoprost (XALATAN) 0.005 % ophthalmic solution Place 1 drop into both eyes at bedtime.  01/30/17   [provider]  lidocaine-prilocaine (EMLA) cream Apply to affected area once Patient taking differently: Apply 1 application topically daily as needed (port access).  06/11/18   Earlie Server, MD  metFORMIN (GLUCOPHAGE) 500 MG tablet Take 500 mg by mouth 2 (two) times daily with a meal.  10/23/16   [provider]  Multiple Vitamin (MULTI-VITAMINS) TABS Take 1 tablet by mouth daily.     [provider]  omeprazole (PRILOSEC) 40 MG capsule Take 40 mg by mouth daily.  12/02/16   [provider]  ondansetron (ZOFRAN) 8 MG tablet Take 1 tablet (8 mg total) by mouth 2 (two) times daily as needed for refractory nausea / vomiting. Start on day 3 after chemo. 06/14/18   Earlie Server, MD  prochlorperazine (COMPAZINE) 10 MG tablet Take 1 tablet (10 mg total) by mouth every 6 (six) hours as needed (Nausea or vomiting). 06/11/18   Earlie Server, MD  Specialty Vitamins Products (MAGNESIUM, AMINO ACID CHELATE,) 133 MG tablet Take 1  tablet by mouth daily.    [provider]  trolamine salicylate (ASPERCREME) 10 % cream Apply 1 application topically as needed for muscle pain.    [provider]    Allergies Shellfish allergy; Iodine; and Sulfa antibiotics  Family History  Problem Relation Age of Onset  . Diabetes Sister   . Diabetes Brother   . Diabetes Sister   . Breast cancer Other   . Hypertension Mother   . Glaucoma Mother   . Heart attack Father     Social History Social History   Tobacco Use  . Smoking status: Never Smoker  . Smokeless tobacco: Never Used  Substance Use Topics  . Alcohol use: Never    Frequency: Never  . Drug  use: Never    Review of Systems Constitutional: Feeling feverish and chills a little bit of fatigue. Eyes: No visual changes. ENT: No sore throat. Cardiovascular: Denies chest pain except left breast and armpit region where swelling is also present. Respiratory: Denies shortness of breath. Gastrointestinal: No abdominal pain.   Genitourinary: Negative for dysuria. Musculoskeletal: Negative for back pain. Skin: Negative for rash. Neurological: Negative for headaches, areas of focal weakness or numbness.    ____________________________________________   PHYSICAL EXAM:  VITAL SIGNS: ED Triage Vitals  Enc Vitals Group     BP 06/19/18 1552 (!) 152/62     Pulse Rate 06/19/18 1552 78     Resp 06/19/18 1552 16     Temp 06/19/18 1552 98.6 F (37 C)     Temp Source 06/19/18 1552 Oral     SpO2 06/19/18 1552 99 %     Weight 06/19/18 1553 150 lb (68 kg)     Height 06/19/18 1553 5\' 7"  (1.702 m)     Head Circumference --      Peak Flow --      Pain Score 06/19/18 1600 8     Pain Loc --      Pain Edu? --      Excl. in [place]? --     Constitutional: Alert and oriented. Well appearing and in no acute distress.  She and her husband are both extremely pleasant. Eyes: Conjunctivae are normal. Head: Atraumatic. Nose: No  congestion/rhinnorhea. Mouth/Throat: Mucous membranes are moist. Neck: No stridor.  Cardiovascular: Normal rate, regular rhythm. Grossly normal heart sounds.  Good peripheral circulation. Respiratory: Normal respiratory effort.  No retractions. Lungs CTAB. Gastrointestinal: Soft and nontender. No distention. Musculoskeletal: No lower extremity tenderness nor edema. Neurologic:  Normal speech and language. No gross focal neurologic deficits are appreciated.  Skin:  Skin is warm, dry and intact. No rash noted except for extensive redness over about half of the left breast, also underneath the left breast fold his surgical wound with dressing in place, there is also a palpable swollen area over the left armpit that appears likely an abscess. Psychiatric: Mood and affect are normal. Speech and behavior are normal.  ____________________________________________   LABS (all labs ordered are listed, but only abnormal results are displayed)  Labs Reviewed  CULTURE, BLOOD (ROUTINE X 2)  CULTURE, BLOOD (ROUTINE X 2)  COMPREHENSIVE METABOLIC PANEL  LIPASE, BLOOD  CBC WITH DIFFERENTIAL/PLATELET  PROTIME-INR  CBG MONITORING, ED   ____________________________________________  EKG  ED ECG REPORT I, Delman Kitten, the attending physician, personally viewed and interpreted this ECG.  Date: 07/19/2018 EKG Time: 2250 Rate: 80 Rhythm: normal sinus rhythm QRS Axis: normal Intervals: normal ST/T Wave abnormalities: normal Narrative Interpretation: no evidence of acute ischemia  ____________________________________________  RADIOLOGY  Dg Chest 2 View  Result Date: 06/19/2018 CLINICAL DATA:  Left breast cellulitis EXAM: CHEST - 2 VIEW COMPARISON:  04/24/2018 FINDINGS: No acute opacity or pleural effusion. Heart size upper normal. Linear atelectasis or scar at the left base. Aortic atherosclerosis. No pneumothorax. No soft tissue emphysema. IMPRESSION: No active cardiopulmonary disease.  Electronically Signed   By: Donavan Foil M.D.   On: 06/19/2018 22:48     ____________________________________________   PROCEDURES  Procedure(s) performed: None  Procedures  Critical Care performed: No  ____________________________________________   INITIAL IMPRESSION / ASSESSMENT AND PLAN / ED COURSE  Pertinent labs & imaging results that were available during my care of the patient were reviewed by me and considered in my medical  decision making (see chart for details).   Probable left breast abscess with associated cellulitis.  Consultation placed to general surgery, anticipate admission and she is on antibiotics with a rapid worsening notable pain and involvement of a large portion of the left breast.  Will broaden antibiotic coverage to vancomycin and ceftriaxone.  Pain control with morphine as it appears quite painful and tender to touch and she reports it severe painful.  Await lab work, rule out sepsis.  Consult placed to Dr. Peyton Najjar at [phone] via [facility] and order in [place] (he is in [place] presently with emergent case).   ----------------------------------------- 11:40 PM on 06/19/2018 -----------------------------------------  Ongoing care assigned to Dr. Owens Shark.  Patient does report pain is starting to improve slightly after morphine.  She is fully awake and alert.  Awaiting lab work at this time.  Labs have been sent, antibiotics initiated.  Consult placed with surgery for further recommendations and patient will require admission       ____________________________________________   FINAL CLINICAL IMPRESSION(S) / ED DIAGNOSES  Final diagnoses:  Cellulitis of left breast        Note:  This document was prepared using Dragon voice recognition software and may include unintentional dictation errors       Delman Kitten, MD 06/19/18 Darci Needle    Delman Kitten, MD 07/19/18 2228

## 2018-06-20 ENCOUNTER — Inpatient Hospital Stay: Payer: Medicare Other | Admitting: Anesthesiology

## 2018-06-20 ENCOUNTER — Inpatient Hospital Stay: Payer: Medicare Other

## 2018-06-20 ENCOUNTER — Other Ambulatory Visit: Payer: Self-pay

## 2018-06-20 ENCOUNTER — Encounter
Admission: EM | Disposition: A | Discharge status: Home Health | Payer: Self-pay | Source: Home / Self Care | Attending: General Surgery

## 2018-06-20 ENCOUNTER — Encounter: Payer: Self-pay | Admitting: Certified Registered Nurse Anesthetist

## 2018-06-20 DIAGNOSIS — N611 Abscess of the breast and nipple: Secondary | ICD-10-CM | POA: Diagnosis present

## 2018-06-20 DIAGNOSIS — Z7982 Long term (current) use of aspirin: Secondary | ICD-10-CM | POA: Diagnosis not present

## 2018-06-20 DIAGNOSIS — Z882 Allergy status to sulfonamides status: Secondary | ICD-10-CM | POA: Diagnosis not present

## 2018-06-20 DIAGNOSIS — Z803 Family history of malignant neoplasm of breast: Secondary | ICD-10-CM | POA: Diagnosis not present

## 2018-06-20 DIAGNOSIS — Z7984 Long term (current) use of oral hypoglycemic drugs: Secondary | ICD-10-CM | POA: Diagnosis not present

## 2018-06-20 DIAGNOSIS — K219 Gastro-esophageal reflux disease without esophagitis: Secondary | ICD-10-CM | POA: Diagnosis present

## 2018-06-20 DIAGNOSIS — N644 Mastodynia: Secondary | ICD-10-CM | POA: Diagnosis present

## 2018-06-20 DIAGNOSIS — Z833 Family history of diabetes mellitus: Secondary | ICD-10-CM | POA: Diagnosis not present

## 2018-06-20 DIAGNOSIS — E785 Hyperlipidemia, unspecified: Secondary | ICD-10-CM | POA: Diagnosis present

## 2018-06-20 DIAGNOSIS — H409 Unspecified glaucoma: Secondary | ICD-10-CM | POA: Diagnosis present

## 2018-06-20 DIAGNOSIS — Z888 Allergy status to other drugs, medicaments and biological substances status: Secondary | ICD-10-CM | POA: Diagnosis not present

## 2018-06-20 DIAGNOSIS — Z9012 Acquired absence of left breast and nipple: Secondary | ICD-10-CM | POA: Diagnosis not present

## 2018-06-20 DIAGNOSIS — E119 Type 2 diabetes mellitus without complications: Secondary | ICD-10-CM | POA: Diagnosis present

## 2018-06-20 DIAGNOSIS — Z8249 Family history of ischemic heart disease and other diseases of the circulatory system: Secondary | ICD-10-CM | POA: Diagnosis not present

## 2018-06-20 DIAGNOSIS — Z853 Personal history of malignant neoplasm of breast: Secondary | ICD-10-CM | POA: Diagnosis not present

## 2018-06-20 DIAGNOSIS — D509 Iron deficiency anemia, unspecified: Secondary | ICD-10-CM | POA: Diagnosis present

## 2018-06-20 DIAGNOSIS — Z79899 Other long term (current) drug therapy: Secondary | ICD-10-CM | POA: Diagnosis not present

## 2018-06-20 DIAGNOSIS — Z91013 Allergy to seafood: Secondary | ICD-10-CM | POA: Diagnosis not present

## 2018-06-20 DIAGNOSIS — I10 Essential (primary) hypertension: Secondary | ICD-10-CM | POA: Diagnosis present

## 2018-06-20 DIAGNOSIS — Z83511 Family history of glaucoma: Secondary | ICD-10-CM | POA: Diagnosis not present

## 2018-06-20 HISTORY — PX: INCISION AND DRAINAGE ABSCESS: SHX5864

## 2018-06-20 HISTORY — DX: Abscess of the breast and nipple: N61.1

## 2018-06-20 LAB — CBC WITH DIFFERENTIAL/PLATELET
Abs Immature Granulocytes: 0.04 10*3/uL (ref 0.00–0.07)
BASOS PCT: 0 %
Basophils Absolute: 0 10*3/uL (ref 0.0–0.1)
Eosinophils Absolute: 0.1 10*3/uL (ref 0.0–0.5)
Eosinophils Relative: 1 %
HCT: 25.3 % — ABNORMAL LOW (ref 36.0–46.0)
Hemoglobin: 8.4 g/dL — ABNORMAL LOW (ref 12.0–15.0)
Immature Granulocytes: 0 %
Lymphocytes Relative: 11 %
Lymphs Abs: 1.1 10*3/uL (ref 0.7–4.0)
MCH: 23.7 pg — ABNORMAL LOW (ref 26.0–34.0)
MCHC: 33.2 g/dL (ref 30.0–36.0)
MCV: 71.3 fL — ABNORMAL LOW (ref 80.0–100.0)
Monocytes Absolute: 1 10*3/uL (ref 0.1–1.0)
Monocytes Relative: 11 %
NEUTROS ABS: 7.4 10*3/uL (ref 1.7–7.7)
Neutrophils Relative %: 77 %
Platelets: 309 10*3/uL (ref 150–400)
RBC: 3.55 MIL/uL — ABNORMAL LOW (ref 3.87–5.11)
RDW: 15.8 % — ABNORMAL HIGH (ref 11.5–15.5)
WBC: 9.6 10*3/uL (ref 4.0–10.5)
nRBC: 0 % (ref 0.0–0.2)

## 2018-06-20 LAB — COMPREHENSIVE METABOLIC PANEL
ALT: 13 U/L (ref 0–44)
AST: 13 U/L — ABNORMAL LOW (ref 15–41)
Albumin: 3 g/dL — ABNORMAL LOW (ref 3.5–5.0)
Alkaline Phosphatase: 60 U/L (ref 38–126)
Anion gap: 9 (ref 5–15)
BUN: 9 mg/dL (ref 8–23)
CO2: 21 mmol/L — ABNORMAL LOW (ref 22–32)
Calcium: 8.6 mg/dL — ABNORMAL LOW (ref 8.9–10.3)
Chloride: 94 mmol/L — ABNORMAL LOW (ref 98–111)
Creatinine, Ser: 0.57 mg/dL (ref 0.44–1.00)
GFR calc Af Amer: >60 mL/min (ref >60)
GFR calc non Af Amer: >60 mL/min (ref >60)
Glucose, Bld: 121 mg/dL — ABNORMAL HIGH (ref 70–99)
Potassium: 3.3 mmol/L — ABNORMAL LOW (ref 3.5–5.1)
Sodium: 124 mmol/L — ABNORMAL LOW (ref 135–145)
Total Bilirubin: 0.5 mg/dL (ref 0.3–1.2)
Total Protein: 6.4 g/dL — ABNORMAL LOW (ref 6.5–8.1)

## 2018-06-20 LAB — GLUCOSE, CAPILLARY
Glucose-Capillary: 119 mg/dL — ABNORMAL HIGH (ref 70–99)
Glucose-Capillary: 204 mg/dL — ABNORMAL HIGH (ref 70–99)

## 2018-06-20 LAB — SODIUM: Sodium: 126 mmol/L — ABNORMAL LOW (ref 135–145)

## 2018-06-20 LAB — LIPASE, BLOOD: LIPASE: 21 U/L (ref 11–51)

## 2018-06-20 LAB — PROTIME-INR
INR: 1.1
Prothrombin Time: 14.1 seconds (ref 11.4–15.2)

## 2018-06-20 SURGERY — INCISION AND DRAINAGE, ABSCESS
Anesthesia: General | Laterality: Left

## 2018-06-20 MED ORDER — SODIUM CHLORIDE 0.9 % IV SOLN
INTRAVENOUS | Status: DC
  Administered 2018-06-20 – 2018-06-22 (×4): via INTRAVENOUS

## 2018-06-20 MED ORDER — VANCOMYCIN HCL IN DEXTROSE 1-5 GM/200ML-% IV SOLN
1000.0000 mg | INTRAVENOUS | Status: DC
  Administered 2018-06-20: 1000 mg via INTRAVENOUS
  Filled 2018-06-20: qty 200

## 2018-06-20 MED ORDER — FAMOTIDINE IN NACL 20-0.9 MG/50ML-% IV SOLN
20.0000 mg | Freq: Two times a day (BID) | INTRAVENOUS | Status: DC
  Administered 2018-06-20 – 2018-06-22 (×5): 20 mg via INTRAVENOUS
  Filled 2018-06-20 (×5): qty 50

## 2018-06-20 MED ORDER — DEXAMETHASONE SODIUM PHOSPHATE 4 MG/ML IJ SOLN
INTRAMUSCULAR | Status: AC
  Filled 2018-06-20: qty 1

## 2018-06-20 MED ORDER — FENTANYL CITRATE (PF) 100 MCG/2ML IJ SOLN
25.0000 ug | INTRAMUSCULAR | Status: DC | PRN
  Administered 2018-06-20 (×4): 25 ug via INTRAVENOUS

## 2018-06-20 MED ORDER — ACETAMINOPHEN 650 MG RE SUPP: 650.0000 mg | Freq: Four times a day (QID) | RECTAL | Status: DC | PRN

## 2018-06-20 MED ORDER — CARVEDILOL 12.5 MG PO TABS
12.5000 mg | ORAL_TABLET | Freq: Two times a day (BID) | ORAL | Status: DC
  Administered 2018-06-20 – 2018-06-21 (×3): 12.5 mg via ORAL
  Filled 2018-06-20: qty 1
  Filled 2018-06-20: qty 2
  Filled 2018-06-20 (×3): qty 1

## 2018-06-20 MED ORDER — DOCUSATE SODIUM 100 MG PO CAPS: 100.0000 mg | ORAL_CAPSULE | Freq: Every day | ORAL | Status: DC | PRN

## 2018-06-20 MED ORDER — ENOXAPARIN SODIUM 40 MG/0.4ML ~~LOC~~ SOLN
40.0000 mg | SUBCUTANEOUS | Status: DC
  Administered 2018-06-20 – 2018-06-21 (×2): 40 mg via SUBCUTANEOUS
  Filled 2018-06-20 (×2): qty 0.4

## 2018-06-20 MED ORDER — FERROUS SULFATE 325 (65 FE) MG PO TABS
325.0000 mg | ORAL_TABLET | Freq: Every day | ORAL | Status: DC
  Administered 2018-06-20 – 2018-06-22 (×3): 325 mg via ORAL
  Filled 2018-06-20 (×3): qty 1

## 2018-06-20 MED ORDER — PANTOPRAZOLE SODIUM 40 MG PO TBEC
40.0000 mg | DELAYED_RELEASE_TABLET | Freq: Every day | ORAL | Status: DC
  Administered 2018-06-20 – 2018-06-22 (×3): 40 mg via ORAL
  Filled 2018-06-20 (×3): qty 1

## 2018-06-20 MED ORDER — MIDAZOLAM HCL 5 MG/5ML IJ SOLN
INTRAMUSCULAR | Status: DC | PRN
  Administered 2018-06-20: 2 mg via INTRAVENOUS

## 2018-06-20 MED ORDER — HYDRALAZINE HCL 50 MG PO TABS
100.0000 mg | ORAL_TABLET | Freq: Three times a day (TID) | ORAL | Status: DC
  Administered 2018-06-20 – 2018-06-22 (×7): 100 mg via ORAL
  Filled 2018-06-20 (×7): qty 2

## 2018-06-20 MED ORDER — ONDANSETRON HCL 4 MG/2ML IJ SOLN: 4.0000 mg | Freq: Four times a day (QID) | INTRAMUSCULAR | Status: DC | PRN

## 2018-06-20 MED ORDER — METHYLPREDNISOLONE SODIUM SUCC 40 MG IJ SOLR
40.0000 mg | Freq: Once | INTRAMUSCULAR | Status: AC
  Administered 2018-06-20: 40 mg via INTRAVENOUS
  Filled 2018-06-20: qty 1

## 2018-06-20 MED ORDER — VANCOMYCIN HCL 10 G IV SOLR: 1.0000 g | Freq: Once | INTRAVENOUS | Status: DC

## 2018-06-20 MED ORDER — FENTANYL CITRATE (PF) 100 MCG/2ML IJ SOLN
INTRAMUSCULAR | Status: AC
  Filled 2018-06-20: qty 2

## 2018-06-20 MED ORDER — LIDOCAINE HCL (CARDIAC) PF 100 MG/5ML IV SOSY
PREFILLED_SYRINGE | INTRAVENOUS | Status: DC | PRN
  Administered 2018-06-20: 80 mg via INTRAVENOUS

## 2018-06-20 MED ORDER — PIPERACILLIN-TAZOBACTAM 3.375 G IVPB 30 MIN: 3.3750 g | Freq: Four times a day (QID) | INTRAVENOUS | Status: DC

## 2018-06-20 MED ORDER — LIDOCAINE HCL (PF) 2 % IJ SOLN
INTRAMUSCULAR | Status: AC
  Filled 2018-06-20: qty 10

## 2018-06-20 MED ORDER — ACETAMINOPHEN 325 MG PO TABS: 650.0000 mg | ORAL_TABLET | Freq: Four times a day (QID) | ORAL | Status: DC | PRN

## 2018-06-20 MED ORDER — DEXAMETHASONE SODIUM PHOSPHATE 10 MG/ML IJ SOLN
INTRAMUSCULAR | Status: DC | PRN
  Administered 2018-06-20: 8 mg via INTRAVENOUS

## 2018-06-20 MED ORDER — PROPOFOL 10 MG/ML IV BOLUS
INTRAVENOUS | Status: AC
  Filled 2018-06-20: qty 20

## 2018-06-20 MED ORDER — IOHEXOL 300 MG/ML  SOLN
75.0000 mL | Freq: Once | INTRAMUSCULAR | Status: AC | PRN
  Administered 2018-06-20: 75 mL via INTRAVENOUS

## 2018-06-20 MED ORDER — MIDAZOLAM HCL 2 MG/2ML IJ SOLN
INTRAMUSCULAR | Status: AC
  Filled 2018-06-20: qty 2

## 2018-06-20 MED ORDER — ONDANSETRON HCL 4 MG/2ML IJ SOLN: 4.0000 mg | Freq: Once | INTRAMUSCULAR | Status: DC | PRN

## 2018-06-20 MED ORDER — PROPOFOL 10 MG/ML IV BOLUS
INTRAVENOUS | Status: DC | PRN
  Administered 2018-06-20: 150 mg via INTRAVENOUS

## 2018-06-20 MED ORDER — DIPHENHYDRAMINE HCL 50 MG/ML IJ SOLN
50.0000 mg | Freq: Once | INTRAMUSCULAR | Status: AC
  Administered 2018-06-20: 50 mg via INTRAVENOUS
  Filled 2018-06-20: qty 1

## 2018-06-20 MED ORDER — ONDANSETRON HCL 4 MG/2ML IJ SOLN
INTRAMUSCULAR | Status: DC | PRN
  Administered 2018-06-20: 4 mg via INTRAVENOUS

## 2018-06-20 MED ORDER — ONDANSETRON 4 MG PO TBDP: 4.0000 mg | ORAL_TABLET | Freq: Four times a day (QID) | ORAL | Status: DC | PRN

## 2018-06-20 MED ORDER — SODIUM CHLORIDE 0.9 % IV SOLN
INTRAVENOUS | Status: DC | PRN
  Administered 2018-06-20: 10 mL via INTRAVENOUS
  Administered 2018-06-20: 50 mL via INTRAVENOUS

## 2018-06-20 MED ORDER — PIPERACILLIN-TAZOBACTAM 3.375 G IVPB
3.3750 g | Freq: Three times a day (TID) | INTRAVENOUS | Status: DC
  Administered 2018-06-20 – 2018-06-22 (×6): 3.375 g via INTRAVENOUS
  Filled 2018-06-20 (×6): qty 50

## 2018-06-20 MED ORDER — HYDROCODONE-ACETAMINOPHEN 5-325 MG PO TABS: 1.0000 | ORAL_TABLET | ORAL | Status: DC | PRN

## 2018-06-20 MED ORDER — FENTANYL CITRATE (PF) 100 MCG/2ML IJ SOLN
50.0000 ug | Freq: Once | INTRAMUSCULAR | Status: AC
  Administered 2018-06-20: 50 ug via INTRAVENOUS
  Filled 2018-06-20: qty 2

## 2018-06-20 MED ORDER — BENAZEPRIL HCL 20 MG PO TABS
40.0000 mg | ORAL_TABLET | Freq: Every day | ORAL | Status: DC
  Administered 2018-06-20 – 2018-06-22 (×3): 40 mg via ORAL
  Filled 2018-06-20 (×5): qty 2

## 2018-06-20 MED ORDER — ONDANSETRON HCL 4 MG/2ML IJ SOLN
INTRAMUSCULAR | Status: AC
  Filled 2018-06-20: qty 2

## 2018-06-20 MED ORDER — MORPHINE SULFATE (PF) 2 MG/ML IV SOLN
2.0000 mg | INTRAVENOUS | Status: DC | PRN
  Administered 2018-06-20: 2 mg via INTRAVENOUS
  Filled 2018-06-20: qty 1

## 2018-06-20 MED ORDER — FENTANYL CITRATE (PF) 100 MCG/2ML IJ SOLN
INTRAMUSCULAR | Status: DC | PRN
  Administered 2018-06-20: 50 ug via INTRAVENOUS

## 2018-06-20 SURGICAL SUPPLY — 29 items
BLADE CLIPPER SURG (BLADE) IMPLANT
BLADE SURG 15 STRL LF DISP TIS (BLADE) ×1 IMPLANT
BLADE SURG 15 STRL SS (BLADE) ×1
BRUSH SCRUB EZ  4% CHG (MISCELLANEOUS)
BRUSH SCRUB EZ 4% CHG (MISCELLANEOUS) IMPLANT
CANISTER SUCT 3000ML PPV (MISCELLANEOUS) ×2 IMPLANT
COVER WAND RF STERILE (DRAPES) IMPLANT
DRAIN PENROSE 1/4X12 LTX (DRAIN) ×2 IMPLANT
DRAPE CHEST BREAST 77X106 FENE (MISCELLANEOUS) IMPLANT
DRAPE LAPAROTOMY 77X122 PED (DRAPES) IMPLANT
ELECT REM PT RETURN 9FT ADLT (ELECTROSURGICAL) ×2
ELECTRODE REM PT RTRN 9FT ADLT (ELECTROSURGICAL) ×1 IMPLANT
GAUZE SPONGE 4X4 12PLY STRL (GAUZE/BANDAGES/DRESSINGS) ×2 IMPLANT
GLOVE BIO SURGEON STRL SZ 6.5 (GLOVE) ×4 IMPLANT
GLOVE BIOGEL PI IND STRL 6.5 (GLOVE) ×2 IMPLANT
GLOVE BIOGEL PI INDICATOR 6.5 (GLOVE) ×2
GOWN STRL REUS W/ TWL LRG LVL3 (GOWN DISPOSABLE) ×2 IMPLANT
GOWN STRL REUS W/TWL LRG LVL3 (GOWN DISPOSABLE) ×2
NEEDLE HYPO 22GX1.5 SAFETY (NEEDLE) IMPLANT
NS IRRIG 1000ML POUR BTL (IV SOLUTION) IMPLANT
PACK BASIN MINOR ARMC (MISCELLANEOUS) ×2 IMPLANT
PAD ABD DERMACEA PRESS 5X9 (GAUZE/BANDAGES/DRESSINGS) ×2 IMPLANT
SCRUB POVIDONE IODINE 4 OZ (MISCELLANEOUS) ×2 IMPLANT
SOL PREP PVP 2OZ (MISCELLANEOUS) ×2
SOLUTION PREP PVP 2OZ (MISCELLANEOUS) ×1 IMPLANT
SPONGE LAP 18X18 RF (DISPOSABLE) ×4 IMPLANT
SUT ETH BLK MONO 3 0 FS 1 12/B (SUTURE) ×2 IMPLANT
SWAB DUAL CULTURE TRANS RED ST (MISCELLANEOUS) ×2 IMPLANT
SYR BULB IRRIG 60ML STRL (SYRINGE) ×4 IMPLANT

## 2018-06-20 NOTE — Anesthesia Post-op Follow-up Note (Signed)
Anesthesia QCDR form completed.        

## 2018-06-20 NOTE — H&P (Signed)
SURGICAL HISTORY AND PHYSICAL NOTE   HISTORY OF PRESENT ILLNESS (HPI):  78 y.o. female presented to [facility] ED for evaluation of left breast and axilla area with tenderness redness and warm. Patient reports she was supposed to have a Mediport placement on 06/16/2018 but she was found with cellulitis on the left breast axillary area and was started antibiotic therapy.  She refers that since then he is continues to get worse, more painful, and warm. Denies fever or chills.  Patient has history of needle guided partial mastectomy with sentinel node biopsy on 04/13/2018.  It was complicated with an abscess on the partial mastectomy wound.  He was needed to be treated with incision and drainage in the OR.  After an incision and drainage it heal properly.  Now patient presented with a new abscess on the left breast/axilla.  Surgery is consulted by Dr. Jacqualine Code in this context for evaluation and management of breast abscess.  PAST MEDICAL HISTORY (PMH):  Past Medical History:  Diagnosis Date  . Anemia    chronic microcytic anemia  . Cancer ([place]) 2019   left breast  . Diabetes mellitus without complication ([place])   . GERD (gastroesophageal reflux disease)   . Glaucoma   . Hypercholesterolemia   . Hypertension      PAST SURGICAL HISTORY ([place]):  Past Surgical History:  Procedure Laterality Date  . BACK SURGERY    . BREAST BIOPSY Right    stereo- neg  . BREAST BIOPSY Left 02/2018  . BREAST EXCISIONAL BIOPSY Left 04/03/2018   left breast cancer lumpectomy   . BREAST LUMPECTOMY Left 04/03/2018  . CATARACT EXTRACTION W/ INTRAOCULAR LENS  IMPLANT, BILATERAL    . EYE SURGERY Bilateral    cataract extraction  . INCISION AND DRAINAGE ABSCESS Left 04/28/2018   Procedure: INCISION AND DRAINAGE ABSCESS;  Surgeon: Benjamine Sprague, DO;  Location: [facility] ORS;  Service: General;  Laterality: Left;  . LUMBAR FUSION  2002?  . LUMBAR LAMINECTOMY  1997   titanium in back  . PARTIAL MASTECTOMY WITH AXILLARY SENTINEL  LYMPH NODE BIOPSY Left 04/13/2018   Procedure: PARTIAL MASTECTOMY WITH AXILLARY SENTINEL LYMPH NODE BIOPSY  (No Needle Loc);  Surgeon: Benjamine Sprague, DO;  Location: [facility] ORS;  Service: General;  Laterality: Left;  . RETINAL DETACHMENT SURGERY Bilateral 2009  . TUBAL LIGATION       MEDICATIONS:  Prior to Admission medications   Medication Sig Start Date End Date Taking? Authorizing Provider  acetaminophen (TYLENOL) 500 MG tablet Take 500 mg by mouth every 6 (six) hours as needed for moderate pain.  11/04/16   [provider]  aspirin 81 MG EC tablet Take 81 mg by mouth daily.     [provider]  atorvastatin (LIPITOR) 10 MG tablet Take 10 mg by mouth every evening.  10/16/16   [provider]  benazepril (LOTENSIN) 40 MG tablet Take 40 mg by mouth daily.  09/11/17 09/11/18  [provider]  brimonidine (ALPHAGAN) 0.2 % ophthalmic solution Place 1 drop into the right eye 2 (two) times daily.     [provider]  carvedilol (COREG) 12.5 MG tablet Take 12.5 mg by mouth 2 (two) times daily with a meal.     [provider]  cephALEXin (KEFLEX) 500 MG capsule Take 1 capsule (500 mg total) by mouth 4 (four) times daily for 10 days. 06/16/18 06/26/18  Vickie Epley, MD  chlorhexidine (PERIDEX) 0.12 % solution Use as directed 15 mLs in the mouth or  throat 2 (two) times daily.  09/07/17   [provider]  Coenzyme Q10 (COQ10 PO) Take 1 tablet by mouth daily.    [provider]  dexamethasone (DECADRON) 4 MG tablet Take 2 tablets (8 mg total) by mouth 2 (two) times daily. Start the day before Taxotere. Then again the day after chemo for 3 days. 06/11/18   Earlie Server, MD  docusate sodium (COLACE) 100 MG capsule Take 100 mg by mouth daily as needed for mild constipation.    [provider]  dorzolamide-timolol (COSOPT) 22.3-6.8 MG/ML ophthalmic solution Place 1 drop into both eyes 2 (two) times daily. 03/12/18   [provider]   ferrous sulfate 325 (65 FE) MG EC tablet Take 1 tablet (325 mg total) by mouth 2 (two) times daily with a meal. Patient taking differently: Take 325 mg by mouth daily.  06/03/18   Earlie Server, MD  hydrALAZINE (APRESOLINE) 100 MG tablet Take 100 mg by mouth 3 (three) times daily.  02/04/18   [provider]  ibuprofen (ADVIL,MOTRIN) 800 MG tablet Take 1 tablet (800 mg total) by mouth every 8 (eight) hours as needed for mild pain or moderate pain. 04/13/18   Lysle Pearl, Isami, DO  latanoprost (XALATAN) 0.005 % ophthalmic solution Place 1 drop into both eyes at bedtime.  01/30/17   [provider]  lidocaine-prilocaine (EMLA) cream Apply to affected area once Patient taking differently: Apply 1 application topically daily as needed (port access).  06/11/18   Earlie Server, MD  metFORMIN (GLUCOPHAGE) 500 MG tablet Take 500 mg by mouth 2 (two) times daily with a meal.  10/23/16   [provider]  Multiple Vitamin (MULTI-VITAMINS) TABS Take 1 tablet by mouth daily.     [provider]  omeprazole (PRILOSEC) 40 MG capsule Take 40 mg by mouth daily.  12/02/16   [provider]  ondansetron (ZOFRAN) 8 MG tablet Take 1 tablet (8 mg total) by mouth 2 (two) times daily as needed for refractory nausea / vomiting. Start on day 3 after chemo. 06/14/18   Earlie Server, MD  prochlorperazine (COMPAZINE) 10 MG tablet Take 1 tablet (10 mg total) by mouth every 6 (six) hours as needed (Nausea or vomiting). 06/11/18   Earlie Server, MD  Specialty Vitamins Products (MAGNESIUM, AMINO ACID CHELATE,) 133 MG tablet Take 1 tablet by mouth daily.    [provider]  trolamine salicylate (ASPERCREME) 10 % cream Apply 1 application topically as needed for muscle pain.    [provider]     ALLERGIES:  Allergies  Allergen Reactions  . Shellfish Allergy Anaphylaxis  . Iodine     Patient unsure if allergic to topical betadine Shellfish allergy with ingestion  . Sulfa Antibiotics Itching      SOCIAL HISTORY:  Social History   Socioeconomic History  . Marital status: Married    Spouse name: [name]  . Number of children: Not on file  . Years of education: Not on file  . Highest education level: Not on file  Occupational History  . Occupation: Restaurant manager, fast food estate  Social Needs  . Financial resource strain: Not on file  . Food insecurity:    Worry: Not on file    Inability: Not on file  . Transportation needs:    Medical: Not on file    Non-medical: Not on file  Tobacco Use  . Smoking status: Never Smoker  . Smokeless tobacco: Never Used  Substance and Sexual Activity  . Alcohol use: Never  Frequency: Never  . Drug use: Never  . Sexual activity: Not on file  Lifestyle  . Physical activity:    Days per week: Not on file    Minutes per session: Not on file  . Stress: Not on file  Relationships  . Social connections:    Talks on phone: Not on file    Gets together: Not on file    Attends religious service: Not on file    Active member of club or organization: Not on file    Attends meetings of clubs or organizations: Not on file    Relationship status: Not on file  . Intimate partner violence:    Fear of current or ex partner: Not on file    Emotionally abused: Not on file    Physically abused: Not on file    Forced sexual activity: Not on file  Other Topics Concern  . Not on file  Social History Narrative  . Not on file    The patient currently resides (home / rehab facility / nursing home): Home The patient normally is (ambulatory / bedbound): Ambulatory   FAMILY HISTORY:  Family History  Problem Relation Age of Onset  . Diabetes Sister   . Diabetes Brother   . Diabetes Sister   . Breast cancer Other   . Hypertension Mother   . Glaucoma Mother   . Heart attack Father      REVIEW OF SYSTEMS:  Constitutional: denies weight loss, fever, chills, or sweats  Eyes: denies any other vision changes, history of eye injury  ENT: denies sore  throat, hearing problems  Respiratory: denies shortness of breath, wheezing  Cardiovascular: denies chest pain, palpitations  Gastrointestinal: denies abdominal pain, N/V, or diarrhea/and bowel function as per HPI Genitourinary: denies burning with urination or urinary frequency Musculoskeletal: denies any other joint pains or cramps  Skin: denies any other rashes or skin discolorations.  Positive for left breast area red and warm. Neurological: denies any other headache, dizziness, weakness  Psychiatric: denies any other depression, anxiety   All other review of systems were negative   VITAL SIGNS:  Temp:  [98.6 F (37 C)] 98.6 F (37 C) (02/22 1552) Pulse Rate:  [67-78] 78 (02/23 0530) Resp:  [16-20] 17 (02/23 0530) BP: (125-159)/(55-73) 149/73 (02/23 0530) SpO2:  [96 %-100 %] 100 % (02/23 0530) Weight:  [68 kg] 68 kg (02/22 1553)     Height: 5\' 7"  (170.2 cm) Weight: 68 kg BMI (Calculated): 23.49   INTAKE/OUTPUT:  This shift: Total I/O In: 796.5 [IV Piggyback:796.5] Out: -   Last 2 shifts: @IOLAST2SHIFTS @   PHYSICAL EXAM:  Constitutional:  -- Normal body habitus  -- Awake, alert, and oriented x3  Eyes:  -- Pupils equally round and reactive to light  -- No scleral icterus  Ear, nose, and throat:  -- No jugular venous distension  Pulmonary:  -- No crackles  -- Equal breath sounds bilaterally -- Breathing non-labored at rest Cardiovascular:  -- S1, S2 present  -- No pericardial rubs Gastrointestinal:  -- Abdomen soft, nontender, non-distended, no guarding or rebound tenderness -- No abdominal masses appreciated, pulsatile or otherwise  Musculoskeletal and Integumentary:  -- Wounds: Left breast/axilla wound.  It is warm, red, tender.  No necrotic tissue, no spontaneous drainage -- Extremities: B/L UE and LE FROM, hands and feet warm, no edema  Neurologic:  -- Motor function: intact and symmetric -- Sensation: intact and symmetric   Labs:  CBC Latest Ref Rng &  Units  06/20/2018 06/02/2018 05/02/2018  WBC 4.0 - 10.5 K/uL 9.6 5.5 5.0  Hemoglobin 12.0 - 15.0 g/dL 8.4(L) 10.3(L) 9.1(L)  Hematocrit 36.0 - 46.0 % 25.3(L) 31.8(L) 27.2(L)  Platelets 150 - 400 K/uL 309 334 329   CMP Latest Ref Rng & Units 06/20/2018 05/02/2018 05/01/2018  Glucose 70 - 99 mg/dL 121(H) 126(H) 153(H)  BUN 8 - 23 mg/dL 9 14 16   Creatinine 0.44 - 1.00 mg/dL 0.57 0.74 0.79  Sodium 135 - 145 mmol/L 124(L) 129(L) 130(L)  Potassium 3.5 - 5.1 mmol/L 3.3(L) 3.9 3.9  Chloride 98 - 111 mmol/L 94(L) 99 101  CO2 22 - 32 mmol/L 21(L) 22 23  Calcium 8.9 - 10.3 mg/dL 8.6(L) 8.7(L) 8.4(L)  Total Protein 6.5 - 8.1 g/dL 6.4(L) - -  Total Bilirubin 0.3 - 1.2 mg/dL 0.5 - -  Alkaline Phos 38 - 126 U/L 60 - -  AST 15 - 41 U/L 13(L) - -  ALT 0 - 44 U/L 13 - -    Imaging studies:  None  Assessment/Plan:  78 y.o. female with left breast/axilla abscess, complicated by pertinent comorbidities including diabetes, hypertension, GERD and breast cancer. Patient with recurrent abscesses, previously on the partial mastectomy area, now on the left breast/axilla area.  Due to the multiple abscesses I will order a CT scan first to completely evaluate the area.  If the abscesses confirm and if there is any other collection on the breast will take her to the OR for incision and drainage.  Patient already started on IV antibiotics, IV hydration and pain management.  Will admit to the hospital for surgical management and postop care.  Arnold Long, MD

## 2018-06-20 NOTE — Anesthesia Procedure Notes (Signed)
Procedure Name: LMA Insertion Date/Time: 06/20/2018 12:57 PM Performed by: Johnna Acosta, CRNA Pre-anesthesia Checklist: Patient identified, Emergency Drugs available, Suction available, Patient being monitored and Timeout performed Patient Re-evaluated:Patient Re-evaluated prior to induction Oxygen Delivery Method: Circle system utilized Preoxygenation: Pre-oxygenation with 100% oxygen Induction Type: IV induction LMA Size: 4.0 Tube type: Oral Number of attempts: 1 Placement Confirmation: positive ETCO2 and breath sounds checked- equal and bilateral Tube secured with: Tape Dental Injury: Teeth and Oropharynx as per pre-operative assessment

## 2018-06-20 NOTE — Anesthesia Preprocedure Evaluation (Signed)
Anesthesia Evaluation  Patient identified by MRN, date of birth, ID band Patient awake    Reviewed: Allergy & Precautions, NPO status , Patient's Chart, lab work & pertinent test results  History of Anesthesia Complications Negative for: history of anesthetic complications  Airway Mallampati: III       Dental   Pulmonary neg sleep apnea, neg COPD,           Cardiovascular hypertension, Pt. on medications (-) Past MI and (-) CHF (-) dysrhythmias (-) Valvular Problems/Murmurs     Neuro/Psych neg Seizures    GI/Hepatic Neg liver ROS, GERD  Controlled and Medicated,  Endo/Other  diabetes, Type 2, Oral Hypoglycemic Agents  Renal/GU negative Renal ROS     Musculoskeletal   Abdominal   Peds  Hematology   Anesthesia Other Findings   Reproductive/Obstetrics                             Anesthesia Physical Anesthesia Plan  ASA: II and emergent  Anesthesia Plan: General   Post-op Pain Management:    Induction: Inhalational  PONV Risk Score and Plan: 3 and Dexamethasone, Ondansetron and Treatment may vary due to age or medical condition  Airway Management Planned: LMA  Additional Equipment:   Intra-op Plan:   Post-operative Plan:   Informed Consent: I have reviewed the patients History and Physical, chart, labs and discussed the procedure including the risks, benefits and alternatives for the proposed anesthesia with the patient or authorized representative who has indicated his/her understanding and acceptance.       Plan Discussed with:   Anesthesia Plan Comments:         Anesthesia Quick Evaluation

## 2018-06-20 NOTE — Progress Notes (Signed)
Pharmacy Antibiotic Note  Jackie Allison is a 78 y.o. female admitted on 06/19/2018 with cellulitis.  Pharmacy has been consulted for vancomycin dosing.  Plan: DW 68 kg  Vd 44L kei 0.5 hr-1  T1/2 14 hours  Vancomycin 1000 mg IV Q 24 hrs. Goal AUC 400-550. Expected AUC: 449 SCr used: 0.8   Height: 5\' 7"  (170.2 cm) Weight: 150 lb (68 kg) IBW/kg (Calculated) : 61.6  Temp (24hrs), Avg:98.6 F (37 C), Min:98.6 F (37 C), Max:98.6 F (37 C)  Recent Labs  Lab 06/20/18 0009  WBC 9.6  CREATININE 0.57    Estimated Creatinine Clearance: 55.5 mL/min (by C-G formula based on SCr of 0.57 mg/dL).    Allergies  Allergen Reactions  . Shellfish Allergy Anaphylaxis  . Iodine     Patient unsure if allergic to topical betadine Shellfish allergy with ingestion  . Sulfa Antibiotics Itching    Antimicrobials this admission:  vancomycin 2/22 >>    >>   Dose adjustments this admission:   Microbiology results: 2/22 BCx: pending   Thank you for allowing pharmacy to be a part of this patient's care.  Daci Stubbe S 06/20/2018 3:18 AM

## 2018-06-20 NOTE — Progress Notes (Signed)
   06/20/18 1750  Clinical Encounter Type  Visited With Patient and family together  Visit Type Initial;Spiritual support  Referral From Nurse  Spiritual Encounters  Spiritual Needs Prayer;Emotional  Stress Factors  Patient Stress Factors Health changes   Upon arrival, patient sitting up in bed. Her husband, sister and brother-in-law were at the bedside. The patient was kind and had a pleasant disposition. Chaplain engaged her in conversation about what brought her to the hospital and the challenging road she has been on since her initial surgery around Christmastime. She expressed frustration with the course of events (one step forward, three steps back) and the pain which has accompanied each step. She notes that she was scheduled to begin chemotherapy, but will need to wait until the infection has cleared. The patient seems like she would appreciate an opportunity to talk one-on-one, so I will follow up with her when the family has gone home.

## 2018-06-20 NOTE — ED Notes (Signed)
ED TO INPATIENT HANDOFF REPORT  ED Nurse Name and Phone #: [place] [employer] [place] Name/Age/Gender Jackie Allison 78 y.o. female Room/Bed: ED24A/ED24A  Code Status   Code Status: Full Code  Home/SNF/Other Home A&O x 4 Is this baseline? yes  Triage Complete: Triage complete  Chief Complaint Abcess  Triage Note Abscess L axilla x 2 days. States is not getting chemo currently.    Allergies Allergies  Allergen Reactions  . Shellfish Allergy Anaphylaxis  . Iodine     Patient unsure if allergic to topical betadine Shellfish allergy with ingestion  . Sulfa Antibiotics Itching    Level of Care/Admitting Diagnosis ED Disposition    ED Disposition Condition [facility] [place] [100120]  Level of Care: Med-Surg [16]  Diagnosis: Breast abscess [825003]  Admitting Physician: Herbert Pun [[id number]]  Attending Physician: Herbert Pun [[id number]]  Estimated length of stay: past midnight tomorrow  Certification:: I certify this patient will need inpatient services for at least 2 midnights  PT Class (Do Not Modify): Inpatient [101]  PT Acc Code (Do Not Modify): Private [1]       B Medical/Surgery History Past Medical History:  Diagnosis Date  . Anemia    chronic microcytic anemia  . Cancer ([place]) 2019   left breast  . Diabetes mellitus without complication ([place])   . GERD (gastroesophageal reflux disease)   . Glaucoma   . Hypercholesterolemia   . Hypertension    Past Surgical History:  Procedure Laterality Date  . BACK SURGERY    . BREAST BIOPSY Right    stereo- neg  . BREAST BIOPSY Left 02/2018  . BREAST EXCISIONAL BIOPSY Left 04/03/2018   left breast cancer lumpectomy   . BREAST LUMPECTOMY Left 04/03/2018  . CATARACT EXTRACTION W/ INTRAOCULAR LENS  IMPLANT, BILATERAL    . EYE SURGERY Bilateral    cataract extraction  . INCISION AND DRAINAGE ABSCESS Left 04/28/2018   Procedure: INCISION AND DRAINAGE  ABSCESS;  Surgeon: Benjamine Sprague, DO;  Location: [facility] ORS;  Service: General;  Laterality: Left;  . LUMBAR FUSION  2002?  . LUMBAR LAMINECTOMY  1997   titanium in back  . PARTIAL MASTECTOMY WITH AXILLARY SENTINEL LYMPH NODE BIOPSY Left 04/13/2018   Procedure: PARTIAL MASTECTOMY WITH AXILLARY SENTINEL LYMPH NODE BIOPSY  (No Needle Loc);  Surgeon: Benjamine Sprague, DO;  Location: [facility] ORS;  Service: General;  Laterality: Left;  . RETINAL DETACHMENT SURGERY Bilateral 2009  . TUBAL LIGATION       A IV Location/Drains/Wounds Patient Lines/Drains/Airways Status   Active Line/Drains/Airways    Name:   Placement date:   Placement time:   Site:   Days:   Peripheral IV 06/19/18 Right Forearm   06/19/18    2258    Forearm   1   Incision (Closed) 04/28/18 Breast Left   04/28/18    1737     53          Intake/Output Last 24 hours  Intake/Output Summary (Last 24 hours) at 06/20/2018 0715 Last data filed at 06/20/2018 0217 Gross per 24 hour  Intake 796.54 ml  Output -  Net 796.54 ml    Labs/Imaging Results for orders placed or performed during the hospital encounter of 06/19/18 (from the past 48 hour(s))  Blood Culture (routine x 2)     Status: None (Preliminary result)   Collection Time: 06/19/18 11:29 PM  Result Value Ref Range   Specimen Description BLOOD RIGHT FOREARM  Special Requests      BOTTLES DRAWN AEROBIC AND ANAEROBIC Blood Culture adequate volume   Culture      NO GROWTH < 12 HOURS Performed at [facility], [address] [place], [place] [address]    Report Status PENDING   Blood Culture (routine x 2)     Status: None (Preliminary result)   Collection Time: 06/19/18 11:29 PM  Result Value Ref Range   Specimen Description BLOOD RIGHT HAND    Special Requests      BOTTLES DRAWN AEROBIC AND ANAEROBIC Blood Culture adequate volume   Culture      NO GROWTH < 12 HOURS Performed at [facility], [address] [place], [place] [address]    Report  Status PENDING   Comprehensive metabolic panel     Status: Abnormal   Collection Time: 06/20/18 12:09 AM  Result Value Ref Range   Sodium 124 (L) 135 - 145 mmol/L   Potassium 3.3 (L) 3.5 - 5.1 mmol/L   Chloride 94 (L) 98 - 111 mmol/L   CO2 21 (L) 22 - 32 mmol/L   Glucose, Bld 121 (H) 70 - 99 mg/dL   BUN 9 8 - 23 mg/dL   Creatinine, Ser 0.57 0.44 - 1.00 mg/dL   Calcium 8.6 (L) 8.9 - 10.3 mg/dL   Total Protein 6.4 (L) 6.5 - 8.1 g/dL   Albumin 3.0 (L) 3.5 - 5.0 g/dL   AST 13 (L) 15 - 41 U/L   ALT 13 0 - 44 U/L   Alkaline Phosphatase 60 38 - 126 U/L   Total Bilirubin 0.5 0.3 - 1.2 mg/dL   GFR calc non Af Amer >60 >60 mL/min   GFR calc Af Amer >60 >60 mL/min   Anion gap 9 5 - 15    Comment: Performed at [facility], [address] [place], [place] [address]  Lipase, blood     Status: None   Collection Time: 06/20/18 12:09 AM  Result Value Ref Range   Lipase 21 11 - 51 U/L    Comment: Performed at [facility], [address] [place], [place] [address]  CBC WITH DIFFERENTIAL     Status: Abnormal   Collection Time: 06/20/18 12:09 AM  Result Value Ref Range   WBC 9.6 4.0 - 10.5 K/uL   RBC 3.55 (L) 3.87 - 5.11 MIL/uL   Hemoglobin 8.4 (L) 12.0 - 15.0 g/dL    Comment: Reticulocyte Hemoglobin testing may be clinically indicated, consider ordering this additional test [id number]    HCT 25.3 (L) 36.0 - 46.0 %   MCV 71.3 (L) 80.0 - 100.0 fL   MCH 23.7 (L) 26.0 - 34.0 pg   MCHC 33.2 30.0 - 36.0 g/dL   RDW 15.8 (H) 11.5 - 15.5 %   Platelets 309 150 - 400 K/uL   nRBC 0.0 0.0 - 0.2 %   Neutrophils Relative % 77 %   Neutro Abs 7.4 1.7 - 7.7 K/uL   Lymphocytes Relative 11 %   Lymphs Abs 1.1 0.7 - 4.0 K/uL   Monocytes Relative 11 %   Monocytes Absolute 1.0 0.1 - 1.0 K/uL   Eosinophils Relative 1 %   Eosinophils Absolute 0.1 0.0 - 0.5 K/uL   Basophils Relative 0 %   Basophils Absolute 0.0 0.0 - 0.1 K/uL   Immature Granulocytes 0 %   Abs Immature Granulocytes 0.04  0.00 - 0.07 K/uL    Comment: Performed at [facility], [address] [place], [place] [address]  Protime-INR     Status: None   Collection Time: 06/20/18 12:09 AM  Result Value Ref Range   Prothrombin Time 14.1 11.4 - 15.2 seconds   INR 1.10     Comment: Performed at [facility], [place], [place] [address]  Glucose, capillary     Status: Abnormal   Collection Time: 06/20/18  3:52 AM  Result Value Ref Range   Glucose-Capillary 119 (H) 70 - 99 mg/dL   Dg Chest 2 View  Result Date: 06/19/2018 CLINICAL DATA:  Left breast cellulitis EXAM: CHEST - 2 VIEW COMPARISON:  04/24/2018 FINDINGS: No acute opacity or pleural effusion. Heart size upper normal. Linear atelectasis or scar at the left base. Aortic atherosclerosis. No pneumothorax. No soft tissue emphysema. IMPRESSION: No active cardiopulmonary disease. Electronically Signed   By: Donavan Foil M.D.   On: 06/19/2018 22:48    Pending Labs Unresulted Labs (From admission, onward)    Start     Ordered   06/21/18 0500  Comprehensive metabolic panel  Tomorrow morning,   STAT     06/20/18 0552   06/21/18 0500  CBC  Tomorrow morning,   STAT     06/20/18 0552          Vitals/Pain Today's Vitals   06/20/18 0500 06/20/18 0530 06/20/18 0600 06/20/18 0630  BP: 140/64 (!) 149/73 136/65 (!) 154/67  Pulse: 71 78 72   Resp: 16 17 17    Temp:      TempSrc:      SpO2: 97% 100% 94%   Weight:      Height:      PainSc:        Isolation Precautions No active isolations  Medications Medications  cefTRIAXone (ROCEPHIN) 2 g in sodium chloride 0.9 % 100 mL IVPB (0 g Intravenous Stopped 06/19/18 2353)  vancomycin (VANCOCIN) IVPB 1000 mg/200 mL premix (has no administration in time range)  benazepril (LOTENSIN) tablet 40 mg (has no administration in time range)  carvedilol (COREG) tablet 12.5 mg (has no administration in time range)  hydrALAZINE (APRESOLINE) tablet 100 mg (has no administration in time  range)  docusate sodium (COLACE) capsule 100 mg (has no administration in time range)  pantoprazole (PROTONIX) EC tablet 40 mg (has no administration in time range)  ferrous sulfate EC tablet 325 mg (has no administration in time range)  enoxaparin (LOVENOX) injection 40 mg (has no administration in time range)  0.9 %  sodium chloride infusion (has no administration in time range)  acetaminophen (TYLENOL) tablet 650 mg (has no administration in time range)    Or  acetaminophen (TYLENOL) suppository 650 mg (has no administration in time range)  HYDROcodone-acetaminophen (NORCO/VICODIN) 5-325 MG per tablet 1-2 tablet (has no administration in time range)  morphine 2 MG/ML injection 2 mg (has no administration in time range)  ondansetron (ZOFRAN-ODT) disintegrating tablet 4 mg (has no administration in time range)    Or  ondansetron (ZOFRAN) injection 4 mg (has no administration in time range)  famotidine (PEPCID) IVPB 20 mg premix (has no administration in time range)  diphenhydrAMINE (BENADRYL) injection 50 mg (has no administration in time range)  vancomycin (VANCOCIN) IVPB 1000 mg/200 mL premix (0 mg Intravenous Stopped 06/20/18 0217)  sodium chloride 0.9 % bolus 500 mL (0 mLs Intravenous Stopped 06/20/18 0003)  morphine 4 MG/ML injection 4 mg (4 mg Intravenous Given 06/19/18 2321)  ondansetron (ZOFRAN) injection 4 mg (4 mg Intravenous Given 06/19/18 2321)  fentaNYL (  SUBLIMAZE) injection 50 mcg (50 mcg Intravenous Given 06/20/18 0353)  methylPREDNISolone sodium succinate (SOLU-MEDROL) 40 mg/mL injection 40 mg (40 mg Intravenous Given 06/20/18 0641)    Mobility Low fall risk

## 2018-06-20 NOTE — Anesthesia Postprocedure Evaluation (Signed)
Anesthesia Post Note  Patient: Jackie Allison  Procedure(s) Performed: INCISION AND DRAINAGE ABSCESS (Left )  Patient location during evaluation: PACU Anesthesia Type: General Level of consciousness: awake and alert Pain management: pain level controlled Vital Signs Assessment: post-procedure vital signs reviewed and stable Respiratory status: spontaneous breathing and respiratory function stable Cardiovascular status: stable Anesthetic complications: no     Last Vitals:  Vitals:   06/20/18 1409 06/20/18 1412  BP: (!) 147/64 (!) 147/64  Pulse: 68 71  Resp: 14 13  Temp: (!) 36.4 C   SpO2: 99% 100%    Last Pain:  Vitals:   06/20/18 1412  TempSrc:   PainSc: 6                  KEPHART,WILLIAM K

## 2018-06-20 NOTE — Op Note (Signed)
Preoperative diagnosis: Left breast abscess   Postoperative diagnosis: Left breast abscess  Procedure: Incision and drainage of left breast abscess and axilla.  Anesthesia: GETA (LMA)  Surgeon: Dr. Windell Moment, MD  Wound Classification: Contaminated  Indications: Patient is a 78 y.o. female  presented with an abscess on the left breast that extend to the axilla.   Findings:  1. Abscess on the left breast 2. 100 mL of purulent secretions drained and cultured 3. Adequate hemostasis.   Description of procedure: The patient was placed in the supine position and general anesthesia was induced. The left breast and axilla were prepped and draped in the usual sterile fashion. A timeout was completed verifying correct patient, procedure, site, positioning, and implant(s) and/or special equipment prior to beginning this procedure.  A skin incision was made on the left upper outer quadrant. The wound was opened and purulent secretions was drained. With a hemostat blunt dissection of septas was done to be able to drain the abscess completely. The cavity was irrigated with bethadine solution and peroxide mixtures. The cavity flushed with water until clear water was aspirated. The wound was packed and penrose drain left in place. Sterile dressing left in place.  The patient tolerated the procedure well and was taken to the postanesthesia care unit in satisfactory condition.   Specimen: left breast abscess culture  Complications: None  Estimated Blood Loss: 3 mL

## 2018-06-20 NOTE — Transfer of Care (Signed)
Immediate Anesthesia Transfer of Care Note  Patient: Jackie Allison  Procedure(s) Performed: INCISION AND DRAINAGE ABSCESS (Left )  Patient Location: PACU  Anesthesia Type:General  Level of Consciousness: drowsy  Airway & Oxygen Therapy: Patient Spontanous Breathing and Patient connected to nasal cannula oxygen  Post-op Assessment: Report given to RN and Post -op Vital signs reviewed and stable  Post vital signs: Reviewed and stable  Last Vitals:  Vitals Value Taken Time  BP 158/67 06/20/2018  1:41 PM  Temp 36.3 C 06/20/2018  1:39 PM  Pulse 66 06/20/2018  1:42 PM  Resp 17 06/20/2018  1:42 PM  SpO2 100 % 06/20/2018  1:42 PM  Vitals shown include unvalidated device data.  Last Pain:  Vitals:   06/20/18 1339  TempSrc:   PainSc: Asleep         Complications: No apparent anesthesia complications

## 2018-06-20 NOTE — Progress Notes (Signed)
   06/20/18 1735  Clinical Encounter Type  Visited With Patient and family together  Visit Type Initial;Spiritual support  Referral From Nurse   Chaplain received an OR for prayer. Will return after the patient has finished dinner, per her request. She stated that she had been NPO for a while prior to surgery and was quite hungry.

## 2018-06-21 ENCOUNTER — Encounter: Payer: Self-pay | Admitting: General Surgery

## 2018-06-21 LAB — GLUCOSE, CAPILLARY
GLUCOSE-CAPILLARY: 236 mg/dL — AB (ref 70–99)
GLUCOSE-CAPILLARY: 198 mg/dL — AB (ref 70–99)
Glucose-Capillary: 166 mg/dL — ABNORMAL HIGH (ref 70–99)

## 2018-06-21 LAB — COMPREHENSIVE METABOLIC PANEL
ALT: 16 U/L (ref 0–44)
AST: 16 U/L (ref 15–41)
Albumin: 2.7 g/dL — ABNORMAL LOW (ref 3.5–5.0)
Alkaline Phosphatase: 58 U/L (ref 38–126)
Anion gap: 11 (ref 5–15)
BUN: 14 mg/dL (ref 8–23)
CO2: 19 mmol/L — ABNORMAL LOW (ref 22–32)
CREATININE: 0.75 mg/dL (ref 0.44–1.00)
Calcium: 8.6 mg/dL — ABNORMAL LOW (ref 8.9–10.3)
Chloride: 98 mmol/L (ref 98–111)
GFR calc Af Amer: >60 mL/min (ref >60)
GFR calc non Af Amer: >60 mL/min (ref >60)
Glucose, Bld: 208 mg/dL — ABNORMAL HIGH (ref 70–99)
Potassium: 3.6 mmol/L (ref 3.5–5.1)
Sodium: 128 mmol/L — ABNORMAL LOW (ref 135–145)
Total Bilirubin: 0.5 mg/dL (ref 0.3–1.2)
Total Protein: 6.4 g/dL — ABNORMAL LOW (ref 6.5–8.1)

## 2018-06-21 LAB — CBC
HCT: 27 % — ABNORMAL LOW (ref 36.0–46.0)
Hemoglobin: 8.9 g/dL — ABNORMAL LOW (ref 12.0–15.0)
MCH: 23.5 pg — ABNORMAL LOW (ref 26.0–34.0)
MCHC: 33 g/dL (ref 30.0–36.0)
MCV: 71.4 fL — ABNORMAL LOW (ref 80.0–100.0)
PLATELETS: 309 10*3/uL (ref 150–400)
RBC: 3.78 MIL/uL — ABNORMAL LOW (ref 3.87–5.11)
RDW: 16.1 % — ABNORMAL HIGH (ref 11.5–15.5)
WBC: 9.7 10*3/uL (ref 4.0–10.5)
nRBC: 0 % (ref 0.0–0.2)

## 2018-06-21 MED ORDER — INSULIN ASPART 100 UNIT/ML ~~LOC~~ SOLN
0.0000 [IU] | Freq: Three times a day (TID) | SUBCUTANEOUS | Status: DC
  Administered 2018-06-21: 5 [IU] via SUBCUTANEOUS
  Filled 2018-06-21 (×2): qty 1

## 2018-06-21 MED ORDER — ENSURE MAX PROTEIN PO LIQD
11.0000 [oz_av] | Freq: Two times a day (BID) | ORAL | Status: DC
  Administered 2018-06-21 – 2018-06-22 (×3): 11 [oz_av] via ORAL
  Filled 2018-06-21: qty 330

## 2018-06-21 NOTE — Progress Notes (Signed)
Initial Nutrition Assessment  DOCUMENTATION CODES:   Not applicable  INTERVENTION:   Ensure Max protein supplement BID, each supplement provides 150kcal and 30g of protein.  Liberalize heart healthy restriction from diet   NUTRITION DIAGNOSIS:   Increased nutrient needs related to cancer and cancer related treatments as evidenced by increased estimated needs.  GOAL:   Patient will meet greater than or equal to 90% of their needs  MONITOR:   PO intake, Supplement acceptance, Labs, Weight trends, Skin, I & O's  REASON FOR ASSESSMENT:   Malnutrition Screening Tool    ASSESSMENT:   78 y.o. female with left breast/axilla abscess, complicated by pertinent comorbidities including diabetes, hypertension, GERD and breast cancer.   Pt s/p I & D L breast 2/23  Met with pt in room today. Pt reports poor appetite and oral intake for several months pta. Pt reports that she does drink Glucerna at home. Pt reports eating 80% of her breakfast this morning but is unhappy that she can't have any salt as she reports that the food tastes bland. Pt reports her UBW is ~165-170lbs. Per chart, pt has lost 16lbs(10%) over the past 2 months; this is significant. RD discussed with pt today the importance of adequate nutrition needed to preserve lean muscle and promote better outcomes. Recommend Ensure vs Glucerna as it is higher in protein. Pt is willing to try chocolate Ensure Max protein in hospital. RD will liberalize the heart healthy portion of pt's diet as this is restrictive of protein. Pt with mild hyperglycemia today; pt reports her last AIC was "6 something". Of note, pt with iron deficiency anemia and is on iron supplementation.     Medications reviewed and include: lovenox, ferrous sulfate, insulin, protonix, NaCl _0 /hr, pepcid, zosyn  Labs reviewed: Na 128(L) Iron 51, TIBC 332, ferritin 15- 2/5 Hgb 8.9(L), Hct 27.0(L), MCV 71.4(L), MCH 23.5(L) cbgs- 119, 204, 236 x 24 hrs  NUTRITION -  FOCUSED PHYSICAL EXAM:    Most Recent Value  Orbital Region  No depletion  Upper Arm Region  No depletion  Thoracic and Lumbar Region  No depletion  Buccal Region  No depletion  Temple Region  No depletion  Clavicle Bone Region  No depletion  Clavicle and Acromion Bone Region  No depletion  Scapular Bone Region  No depletion  Dorsal Hand  No depletion  Patellar Region  No depletion  Anterior Thigh Region  No depletion  Posterior Calf Region  No depletion  Edema (RD Assessment)  None  Hair  Reviewed  Eyes  Reviewed  Mouth  Reviewed  Skin  Reviewed  Nails  Reviewed     Diet Order:   Diet Order            Diet Carb Modified Fluid consistency: Thin; Room service appropriate? Yes  Diet effective now             EDUCATION NEEDS:   Education needs have been addressed  Skin:  Skin Assessment: Reviewed RN Assessment(incision L breast)  Last BM:  Pta  Height:   Ht Readings from Last 1 Encounters:  06/19/18 _1  (1.702 m)    Weight:   Wt Readings from Last 1 Encounters:  06/19/18 68 kg    Ideal Body Weight:  61.3 kg  BMI:  Body mass index is 23.49 kg/m.  Estimated Nutritional Needs:   Kcal:  1600-1800kcal/day   Protein:  82-90g/day   Fluid:  1.6L/day   Koleen Distance MS, RD, LDN Pager #[phone] Office#-  [phone] After Hours Pager: [phone]

## 2018-06-21 NOTE — Progress Notes (Addendum)
Patient seen and examined, incision and drainage of Left axillary abscess by Dr. Windell Moment appreciated and discussed with Dr. Windell Moment, patient, and her husband. Patient describes her Left axillary pain continues to feel much improved following incision and drainage without any fever/chills, CP, or SOB, and it sounds like she is anticipated discharge to home tomorrow with oral antibiotics. Patient will be made an outpatient surgical follow-up appointment with myself for 1 week from tomorrow (as discussed with patient and her husband). Our office will call patient with the time for her follow-up appointment.  Please call if any questions or concerns regarding the above.  -- Marilynne Drivers Rosana Hoes, MD, [place]: [facility] - Partnering for exceptional care. Office: [phone]

## 2018-06-21 NOTE — Progress Notes (Signed)
[facility] Day(s): 1.   Post op day(s): 1 Day Post-Op.   Interval History: Patient seen and examined, no acute events or new complaints overnight. Patient reports still having significant pain on the left axillary area.  Still with some erythema.  Vital signs in last 24 hours: [min-max] current  Temp:  [97.4 F (36.3 C)-98.4 F (36.9 C)] 97.6 F (36.4 C) (02/24 0407) Pulse Rate:  [67-74] 74 (02/24 0407) Resp:  [12-20] 20 (02/24 0407) BP: (105-166)/(50-70) 160/70 (02/24 0407) SpO2:  [93 %-100 %] 100 % (02/24 0407)     Height: 5\' 7"  (170.2 cm) Weight: 68 kg BMI (Calculated): 23.49    Physical Exam:  Constitutional: alert, cooperative and no distress  Extremity: Left axilla with erythema and persistent purulent drainage.   Labs:  CBC Latest Ref Rng & Units 06/21/2018 06/20/2018 06/02/2018  WBC 4.0 - 10.5 K/uL 9.7 9.6 5.5  Hemoglobin 12.0 - 15.0 g/dL 8.9(L) 8.4(L) 10.3(L)  Hematocrit 36.0 - 46.0 % 27.0(L) 25.3(L) 31.8(L)  Platelets 150 - 400 K/uL 309 309 334   CMP Latest Ref Rng & Units 06/21/2018 06/20/2018 06/20/2018  Glucose 70 - 99 mg/dL 208(H) - 121(H)  BUN 8 - 23 mg/dL 14 - 9  Creatinine 0.44 - 1.00 mg/dL 0.75 - 0.57  Sodium 135 - 145 mmol/L 128(L) 126(L) 124(L)  Potassium 3.5 - 5.1 mmol/L 3.6 - 3.3(L)  Chloride 98 - 111 mmol/L 98 - 94(L)  CO2 22 - 32 mmol/L 19(L) - 21(L)  Calcium 8.9 - 10.3 mg/dL 8.6(L) - 8.6(L)  Total Protein 6.5 - 8.1 g/dL 6.4(L) - 6.4(L)  Total Bilirubin 0.3 - 1.2 mg/dL 0.5 - 0.5  Alkaline Phos 38 - 126 U/L 58 - 60  AST 15 - 41 U/L 16 - 13(L)  ALT 0 - 44 U/L 16 - 13    Imaging studies: No new pertinent imaging studies   Assessment/Plan:  78 y.o. female with left breast wound abscess 1 Day Post-Op s/p incision and drainage. With persistent erythema and purulent secretions.  Patient needs to continue with IV antibiotics for at least 24 hours.  At least patient pain has improved we will reevaluate in the morning to see if the  erythema and drainage improved to be able to be discharged.  Arnold Long, MD

## 2018-06-21 NOTE — Evaluation (Signed)
Physical Therapy Evaluation Patient Details Name: Jackie Allison MRN: [id number] DOB: 20-Nov-1940 Today's Date: 06/21/2018   History of Present Illness  Pt is a 78 y.o. female presenting to hospital 06/19/18 with L breast pain and suspected abscess.  Pt s/p I&D L breast abscess and axilla on 06/20/18. Hx of I&D L breast abscess on 04/28/18.  Pt s/p lumpectomy 04/03/18 and s/p partial mastectomy 04/13/18 secondary L breast CA.  PMH also includes anemia, L breast CA, DM, htn, lymphedema.  Clinical Impression  Prior to hospital admission, pt was independent (occasional use of SPC).  Pt lives with spouse in 1 level home (no steps to enter).  Currently pt is SBA semi-supine to sit and min assist to stand.  Pt's BP noted to be 195/82 (prior to ambulation) so deferred further mobility; nurse notified immediately of pt's elevated BP.  Pt left sitting on edge of bed with NT present for sponge bath.  Pt would benefit from skilled PT to address noted impairments and functional limitations (see below for any additional details).  Upon hospital discharge, recommend pt discharge with HHPT.    Follow Up Recommendations Home health PT;Supervision/Assistance - 24 hour    Equipment Recommendations  Rolling walker with 5" wheels    Recommendations for Other Services OT consult     Precautions / Restrictions Precautions Precautions: Fall Restrictions Weight Bearing Restrictions: No Other Position/Activity Restrictions: No specific WBing precautions for LUE (per OT: "instructed pt/spouse in minimal/light use for ADL, minimal weightbearing and shoulder ROM")      Mobility  Bed Mobility Overal bed mobility: Needs Assistance Bed Mobility: Supine to Sit     Supine to sit: Supervision;HOB elevated Sit to supine: Min assist   General bed mobility comments: mild increased effort to perform on own; pt preferring not to use L UE  Transfers Overall transfer level: Needs assistance Equipment used: None Transfers:  Sit to/from Stand Sit to Stand: Min assist Stand pivot transfers: Min guard;Min assist      Lateral/Scoot Transfers: Min guard General transfer comment: assist to initiate stand  Ambulation/Gait             General Gait Details: Deferred d/t elevated BP  Stairs            Wheelchair Mobility    Modified Rankin (Stroke Patients Only)       Balance Overall balance assessment: Needs assistance Sitting-balance support: No upper extremity supported;Feet supported Sitting balance-Leahy Scale: Good     Standing balance support: No upper extremity supported Standing balance-Leahy Scale: Fair Standing balance comment: steady static standing                             Pertinent Vitals/Pain Pain Assessment: 0-10 Pain Score: 2  Pain Location: L breast/axilla Pain Descriptors / Indicators: Aching;Sore Pain Intervention(s): Limited activity within patient's tolerance;Monitored during session;Repositioned  HR WFL    Home Living Family/patient expects to be discharged to:: Private residence Living Arrangements: Spouse/significant other Available Help at Discharge: Family Type of Home: House Home Access: Level entry     Home Layout: One level Home Equipment: Cane - single point;Walker - 2 wheels Additional Comments: [place] apartment - walk-in shower, shower chair, handheld shower head, no steps to enter. Pt has SPC and 2WW.    Prior Function Level of Independence: Independent         Comments: Pt occasionally will use SPC.  Holds onto shopping carts in community.  Husband assists with cleaning. No falls in past 12 months.     Hand Dominance   Dominant Hand: Right    Extremity/Trunk Assessment   Upper Extremity Assessment Upper Extremity Assessment: Defer to OT evaluation LUE Deficits / Details: AROM shoulder flexion 0-90 comfortably, grip 5/5(Per OT eval) LUE: (Per OT eval) LUE Sensation: (Per OT eval) LUE Coordination: (Per OT  eval)    Lower Extremity Assessment Lower Extremity Assessment: Generalized weakness    Cervical / Trunk Assessment Cervical / Trunk Assessment: Normal  Communication   Communication: No difficulties  Cognition Arousal/Alertness: Awake/alert Behavior During Therapy: WFL for tasks assessed/performed Overall Cognitive Status: Within Functional Limits for tasks assessed                                        General Comments General comments (skin integrity, edema, etc.): incision noted to be covered with clean dry bandage.  Nursing cleared pt for participation in physical therapy.  Pt agreeable to PT session.  Pt's husband present.    Exercises    Assessment/Plan    PT Assessment Patient needs continued PT services  PT Problem List Decreased strength;Decreased activity tolerance;Decreased balance;Decreased mobility;Decreased knowledge of precautions;Pain;Decreased skin integrity       PT Treatment Interventions DME instruction;Gait training;Functional mobility training;Therapeutic activities;Therapeutic exercise;Balance training;Patient/family education    PT Goals (Current goals can be found in the Care Plan section)  Acute Rehab PT Goals Patient Stated Goal: go home and recover PT Goal Formulation: With patient Time For Goal Achievement: 07/05/18 Potential to Achieve Goals: Good    Frequency Min 2X/week   Barriers to discharge        Co-evaluation               AM-PAC PT "6 Clicks" Mobility  Outcome Measure Help needed turning from your back to your side while in a flat bed without using bedrails?: A Little Help needed moving from lying on your back to sitting on the side of a flat bed without using bedrails?: A Little Help needed moving to and from a bed to a chair (including a wheelchair)?: A Little Help needed standing up from a chair using your arms (e.g., wheelchair or bedside chair)?: A Little Help needed to walk in hospital room?: A  Little Help needed climbing 3-5 steps with a railing? : A Little 6 Click Score: 18    End of Session Equipment Utilized During Treatment: Gait belt Activity Tolerance: Other (comment)(Limited d/t elevated BP) Patient left: (sitting on edge of bed with NT present for sponge bath) Nurse Communication: Mobility status;Precautions;Other (comment)(Pt's elevated BP) PT Visit Diagnosis: Other abnormalities of gait and mobility (R26.89);Muscle weakness (generalized) (M62.81);Difficulty in walking, not elsewhere classified (R26.2);Pain Pain - Right/Left: Left Pain - part of body: Shoulder    Time: 1157-2620 PT Time Calculation (min) (ACUTE ONLY): 12 min   Charges:   PT Evaluation $PT Eval Low Complexity: 1 Low         Shandell Jallow, PT 06/21/18, 4:49 PM [phone]

## 2018-06-21 NOTE — Telephone Encounter (Signed)
Per Dr Tasia Catchings , can discontinue Zofran. Surgeon gave her [place]  Patient is currently hospitalized due to her abcess

## 2018-06-21 NOTE — Addendum Note (Signed)
Addended by: Vernetta Honey on: 06/21/2018 02:53 PM   Modules accepted: Orders

## 2018-06-21 NOTE — Evaluation (Signed)
Occupational Therapy Evaluation Patient Details Name: Jackie Allison MRN: [id number] DOB: [birth year] Today's Date: 06/21/2018    History of Present Illness Pt is a 78 y.o. female s/p I&D L breast abscess and axilla on 06/20/18. Hx of I&D L breast abscess on 04/28/18.  Pt s/p lumpectomy 04/03/18 and s/p partial mastectomy 04/13/18 secondary L breast CA.  PMH also includes anemia, L breast CA, DM, htn, lymphedema.   Clinical Impression   Pt seen for OT evaluation this date. Pt was independent in all ADLs and mobility (occasional SPC/RW use for community mobility), living in a [place] apartment with her spouse. Pt reports becoming easily fatigued or out of breath with minimize exertion recently. Pt currently having surgical site pain, limiting her LUE shoulder flexion ROM, weightbearing through LUE, demonstrates impaired balance, strength overall, and decreased activity tolerance. Pt currently requires mod assist for LB ADL, min A for UB ADL, and CGA to Min A for functional transfers from arm chair to EOB with assist back to bed for BLE mgt. Pt/spouse instructed in adaptive strategies for dressing and bathing, energy conservation strategies including activity pacing, home/routines modifications, work simplification, AE/DME, prioritizing of meaningful occupations, and falls prevention. Pt/spouse also instructed in bladder mgt strategies to minimize need to rush to the bathroom. Pt/spouse verbalized understanding but would benefit from additional skilled OT services to maximize recall and carryover of learned techniques and facilitate implementation of learned techniques into daily routines. Upon discharge, recommend [facility] services.      Follow Up Recommendations  Home health OT    Equipment Recommendations  3 in 1 bedside commode    Recommendations for Other Services       Precautions / Restrictions Precautions Precautions: Fall Restrictions Weight Bearing Restrictions: No Other  Position/Activity Restrictions: no specific WBing precautions for LUE, instructed pt/spouse in minimal/light use for ADL, minimal weightbearing and shoulder ROM      Mobility Bed Mobility Overal bed mobility: Needs Assistance Bed Mobility: Sit to Supine       Sit to supine: Min assist   General bed mobility comments: Min A for BLE mgt back to bed  Transfers Overall transfer level: Needs assistance Equipment used: 1 person hand held assist Transfers: Sit to/from [facility];Lateral/Scoot Transfers Sit to Stand: Min guard;Min assist Stand pivot transfers: Min guard;Min assist      Lateral/Scoot Transfers: Min guard General transfer comment: cues for hand placement/safety    Balance Overall balance assessment: Needs assistance Sitting-balance support: No upper extremity supported;Feet supported Sitting balance-Leahy Scale: Good     Standing balance support: Single extremity supported;During functional activity Standing balance-Leahy Scale: Poor Standing balance comment: required RUE support for functional mobility tasks                           ADL either performed or assessed with clinical judgement   ADL Overall ADL's : Needs assistance/impaired Eating/Feeding: Sitting;Set up   Grooming: Sitting;Set up   Upper Body Bathing: Sitting;Minimal assistance   Lower Body Bathing: Sit to/from stand;Moderate assistance   Upper Body Dressing : Sitting;Minimal assistance   Lower Body Dressing: Sit to/from stand;Moderate assistance   Toilet Transfer: BSC;Stand-pivot;Minimal assistance;Min guard                   Vision Baseline Vision/History: Wears glasses Wears Glasses: At all times Patient Visual Report: No change from baseline       Perception     Praxis  Pertinent Vitals/Pain Pain Assessment: 0-10 Pain Score: 2  Pain Location: L breast/axilla Pain Descriptors / Indicators: Aching;Sore Pain Intervention(s): Limited  activity within patient's tolerance;Monitored during session;Repositioned     Hand Dominance Right   Extremity/Trunk Assessment Upper Extremity Assessment Upper Extremity Assessment: LUE deficits/detail(RUE WFL) LUE Deficits / Details: AROM shoulder flexion 0-90 comfortably, grip 5/5 LUE: Unable to fully assess due to pain LUE Sensation: WNL LUE Coordination: decreased gross motor   Lower Extremity Assessment Lower Extremity Assessment: Generalized weakness;Defer to PT evaluation   Cervical / Trunk Assessment Cervical / Trunk Assessment: Normal   Communication Communication Communication: No difficulties   Cognition Arousal/Alertness: Awake/alert Behavior During Therapy: WFL for tasks assessed/performed Overall Cognitive Status: Within Functional Limits for tasks assessed                                     General Comments  incision noted to be covered with clean dry bandage    Exercises Other Exercises Other Exercises: pt/spouse instructed in adaptive strategies for bathing and dressing tasks, home/routines modifications, positioning considerations to maximize safety/indep with ADL  Other Exercises: pt/spouse educated in toileting schedule and incontinence products to assist with bladder mgt for pt and to minimize need to "rush" to the bathroom and minimize risk of falls Other Exercises: pt/spouse instructed in energy conservation strategies to maximize safety/indep with ADL and IADL tasks while minimizing over exertion and falls risk   Shoulder Instructions      Home Living Family/patient expects to be discharged to:: Other (Comment)                                 Additional Comments: [place] apartment - walk-in shower, shower chair, handheld shower head, no steps to enter. Pt has SPC and 2WW.      Prior Functioning/Environment Level of Independence: Independent        Comments: Pt occasionally will use SPC or RW.  Husband  assists with cleaning. No falls in past 12 months.        OT Problem List: Decreased strength;Decreased range of motion;Decreased knowledge of use of DME or AE;Impaired UE functional use;Pain;Decreased activity tolerance;Cardiopulmonary status limiting activity;Impaired balance (sitting and/or standing)      OT Treatment/Interventions: Self-care/ADL training;Balance training;Therapeutic exercise;Therapeutic activities;Energy conservation;DME and/or AE instruction;Patient/family education;Manual therapy    OT Goals(Current goals can be found in the care plan section) Acute Rehab OT Goals Patient Stated Goal: go home and recover OT Goal Formulation: With patient/family Time For Goal Achievement: 07/05/18 Potential to Achieve Goals: Good ADL Goals Pt Will Perform Upper Body Dressing: sitting;with supervision Pt Will Perform Lower Body Dressing: sit to/from stand;with min guard assist Pt Will Transfer to Toilet: with min guard assist;ambulating(LRAD for amb, elevated commode)  OT Frequency: Min 1X/week   Barriers to D/C:            Co-evaluation              AM-PAC OT "6 Clicks" Daily Activity     Outcome Measure Help from another person eating meals?: None Help from another person taking care of personal grooming?: None Help from another person toileting, which includes using toliet, bedpan, or urinal?: A Little Help from another person bathing (including washing, rinsing, drying)?: A Lot Help from another person to put on and taking off regular upper body clothing?: A  Little Help from another person to put on and taking off regular lower body clothing?: A Lot 6 Click Score: 18   End of Session Equipment Utilized During Treatment: Gait belt  Activity Tolerance: Patient tolerated treatment well Patient left: in bed;with call bell/phone within reach;with bed alarm set;with family/visitor present  OT Visit Diagnosis: Other abnormalities of gait and mobility (R26.89);Muscle  weakness (generalized) (M62.81);Pain Pain - Right/Left: Left Pain - part of body: Arm(and axilla)                Time: 4709-2957 OT Time Calculation (min): 34 min Charges:  OT General Charges $OT Visit: 1 Visit OT Evaluation $OT Eval Low Complexity: 1 Low OT Treatments $Self Care/Home Management : 23-37 mins  Jeni Salles, MPH, MS, OTR/L ascom [phone] 06/21/18, 4:03 PM

## 2018-06-22 ENCOUNTER — Ambulatory Visit: Payer: Medicare Other | Admitting: Surgery

## 2018-06-22 LAB — GLUCOSE, CAPILLARY: Glucose-Capillary: 125 mg/dL — ABNORMAL HIGH (ref 70–99)

## 2018-06-22 MED ORDER — HYDROCODONE-ACETAMINOPHEN 5-325 MG PO TABS: 1.0000 | ORAL_TABLET | ORAL | 0 refills | Status: DC | PRN

## 2018-06-22 MED ORDER — AMOXICILLIN-POT CLAVULANATE 875-125 MG PO TABS: 1.0000 | ORAL_TABLET | Freq: Two times a day (BID) | ORAL | 0 refills | Status: AC

## 2018-06-22 NOTE — Discharge Instructions (Signed)
°  Diet: Resume home heart healthy regular diet.   Activity: No heavy lifting >20 pounds (children, pets, laundry, garbage) or strenuous activity until follow-up, but light activity and walking are encouraged. Do not drive or drink alcohol if taking narcotic pain medications.  Wound care: Remove dressing at least once a day and as needed due to significant drainage. May shower with soapy water and pat dry (do not rub incisions), but no baths or submerging incision underwater until follow-up. (no swimming)   Medications: Resume all home medications. For mild to moderate pain: acetaminophen (Tylenol) or ibuprofen (if no kidney disease). Combining Tylenol with alcohol can substantially increase your risk of causing liver disease. Narcotic pain medications, if prescribed, can be used for severe pain, though may cause nausea, constipation, and drowsiness. Do not combine Tylenol and Norco within a 6 hour period as Norco contains Tylenol. If you do not need the narcotic pain medication, you do not need to fill the prescription.   Take antibiotic therapy as prescribed.  Call office [phone]) at any time if any questions, worsening pain, fevers/chills, bleeding, drainage from incision site, or other concerns.

## 2018-06-22 NOTE — Progress Notes (Signed)
Physical Therapy Treatment Patient Details Name: Jackie Allison MRN: [id number] DOB: 02-07-1941 Today's Date: 06/22/2018    History of Present Illness Pt is a 78 y.o. female presenting to hospital 06/19/18 with L breast pain and suspected abscess.  Pt s/p I&D L breast abscess and axilla on 06/20/18. Hx of I&D L breast abscess on 04/28/18.  Pt s/p lumpectomy 04/03/18 and s/p partial mastectomy 04/13/18 secondary L breast CA.  PMH also includes anemia, L breast CA, DM, htn, lymphedema.    PT Comments    Patient received in bed, son present. Patient reports she is doing fine, does not feel like she has any trouble getting around. With some encouragement, patient agrees to walk in room to demonstrate safety. Patient performs bed mobility with modified independence, increased time due to decreased use of Left UE. Transfers with supervision. Ambulates 20-25 feet in room without ad and supervision. No LOB, slow cadence. Patient will benefit from continued HHPT upon discharge to improve strength and improve independence and safety with mobility.        Follow Up Recommendations  Home health PT;Supervision - Intermittent     Equipment Recommendations       Recommendations for Other Services       Precautions / Restrictions Precautions Precautions: Fall Restrictions Weight Bearing Restrictions: No    Mobility  Bed Mobility Overal bed mobility: Modified Independent Bed Mobility: Supine to Sit;Sit to Supine     Supine to sit: Modified independent (Device/Increase time) Sit to supine: Modified independent (Device/Increase time)      Transfers Overall transfer level: Modified independent Equipment used: None Transfers: Sit to/from Stand Sit to Stand: Modified independent (Device/Increase time)            Ambulation/Gait Ambulation/Gait assistance: Modified independent (Device/Increase time) Gait Distance (Feet): 20 Feet Assistive device: None Gait Pattern/deviations: WFL(Within  Functional Limits) Gait velocity: decreased   General Gait Details: increased time, no LOB    Stairs             Wheelchair Mobility    Modified Rankin (Stroke Patients Only)       Balance Overall balance assessment: Independent Sitting-balance support: No upper extremity supported;Feet supported Sitting balance-Leahy Scale: Normal     Standing balance support: No upper extremity supported Standing balance-Leahy Scale: Good Standing balance comment: steady static standing, steady walking, no LOB                            Cognition Arousal/Alertness: Awake/alert Behavior During Therapy: WFL for tasks assessed/performed Overall Cognitive Status: Within Functional Limits for tasks assessed                                        Exercises      General Comments        Pertinent Vitals/Pain Pain Assessment: Faces Faces Pain Scale: Hurts a little bit Pain Location: L breast/axilla Pain Descriptors / Indicators: Aching;Sore Pain Intervention(s): Monitored during session    Home Living                      Prior Function            PT Goals (current goals can now be found in the care plan section) Acute Rehab PT Goals Patient Stated Goal: go home and recover PT Goal Formulation: With patient Time For Goal  Achievement: 07/05/18 Potential to Achieve Goals: Good Progress towards PT goals: Progressing toward goals    Frequency    Min 2X/week      PT Plan Current plan remains appropriate    Co-evaluation              AM-PAC PT "6 Clicks" Mobility   Outcome Measure  Help needed turning from your back to your side while in a flat bed without using bedrails?: A Little Help needed moving from lying on your back to sitting on the side of a flat bed without using bedrails?: A Little Help needed moving to and from a bed to a chair (including a wheelchair)?: A Little Help needed standing up from a chair using  your arms (e.g., wheelchair or bedside chair)?: A Little Help needed to walk in hospital room?: A Little Help needed climbing 3-5 steps with a railing? : A Little 6 Click Score: 18    End of Session Equipment Utilized During Treatment: Gait belt Activity Tolerance: Patient tolerated treatment well Patient left: in bed;with family/visitor present Nurse Communication: Mobility status PT Visit Diagnosis: Other abnormalities of gait and mobility (R26.89);Muscle weakness (generalized) (M62.81);Difficulty in walking, not elsewhere classified (R26.2);Pain Pain - Right/Left: Left Pain - part of body: Shoulder     Time: 2841-3244 PT Time Calculation (min) (ACUTE ONLY): 25 min  Charges:  $Gait Training: 23-37 mins                     Yanis Juma, PT, GCS 06/22/18,10:30 AM

## 2018-06-22 NOTE — Discharge Summary (Signed)
Patient ID: Jackie Allison MRN: [id number] DOB/AGE: [birth year] 78 y.o.  Admit date: 06/19/2018 Discharge date: 06/22/2018   Discharge Diagnoses:  Active Problems:   Breast abscess   Procedures: Patient and drainage of left breast abscess  Hospital Course: The left breast abscess.  Taken to the OR for incision and drainage.  Patient tolerated the procedure well.  She continue with IV antibiotic therapy until the swelling and the purulent drainage improve.  Today was serous drainage.  Decreased erythema.  Pain is controlled.  Patient able to move her arm with better range of motion.  Tolerating diet.  No leukocytosis, no fever.  Physical Exam  Constitutional: She is oriented to person, place, and time and well-developed, well-nourished, and in no distress.  Neck: Normal range of motion.  Cardiovascular: Normal rate and regular rhythm.  Pulmonary/Chest: Effort normal and breath sounds normal. No respiratory distress. She has no wheezes.  Abdominal: Soft.  Musculoskeletal: Normal range of motion.  Neurological: She is alert and oriented to person, place, and time.  Skin: Skin is warm.  left upper outer quadrant of the breast with minimal erythema.  Penrose in place.  Minimal serous drainage.   Consults: None  Disposition: Discharge disposition: 01-Home or Self Care       Discharge Instructions    Diet - low sodium heart healthy   Complete by:  As directed    Increase activity slowly   Complete by:  As directed      Allergies as of 06/22/2018      Reactions   Shellfish Allergy Anaphylaxis   Iodine    Patient unsure if allergic to topical betadine Shellfish allergy with ingestion   Sulfa Antibiotics Itching      Medication List    STOP taking these medications   cephALEXin 500 MG capsule Commonly known as:  KEFLEX     TAKE these medications   acetaminophen 500 MG tablet Commonly known as:  TYLENOL Take 500 mg by mouth every 6 (six) hours as needed for moderate  pain.   amoxicillin-clavulanate 875-125 MG tablet Commonly known as:  AUGMENTIN Take 1 tablet by mouth 2 (two) times daily for 7 days.   aspirin 81 MG EC tablet Take 81 mg by mouth daily.   atorvastatin 10 MG tablet Commonly known as:  LIPITOR Take 10 mg by mouth every evening.   benazepril 40 MG tablet Commonly known as:  LOTENSIN Take 40 mg by mouth daily.   brimonidine 0.2 % ophthalmic solution Commonly known as:  ALPHAGAN Place 1 drop into the right eye 2 (two) times daily.   carvedilol 12.5 MG tablet Commonly known as:  COREG Take 12.5 mg by mouth 2 (two) times daily with a meal.   chlorhexidine 0.12 % solution Commonly known as:  PERIDEX Use as directed 15 mLs in the mouth or throat 2 (two) times daily.   COQ10 PO Take 1 tablet by mouth daily.   dexamethasone 4 MG tablet Commonly known as:  DECADRON Take 2 tablets (8 mg total) by mouth 2 (two) times daily. Start the day before Taxotere. Then again the day after chemo for 3 days.   docusate sodium 100 MG capsule Commonly known as:  COLACE Take 100 mg by mouth daily as needed for mild constipation.   dorzolamide-timolol 22.3-6.8 MG/ML ophthalmic solution Commonly known as:  COSOPT Place 1 drop into both eyes 2 (two) times daily.   ferrous sulfate 325 (65 FE) MG EC tablet Take 1 tablet (325 mg total) by  mouth 2 (two) times daily with a meal. What changed:  when to take this   hydrALAZINE 100 MG tablet Commonly known as:  APRESOLINE Take 100 mg by mouth 3 (three) times daily.   HYDROcodone-acetaminophen 5-325 MG tablet Commonly known as:  NORCO/VICODIN Take 1-2 tablets by mouth every 4 (four) hours as needed for moderate pain.   ibuprofen 800 MG tablet Commonly known as:  ADVIL,MOTRIN Take 1 tablet (800 mg total) by mouth every 8 (eight) hours as needed for mild pain or moderate pain.   latanoprost 0.005 % ophthalmic solution Commonly known as:  XALATAN Place 1 drop into both eyes at bedtime.    lidocaine-prilocaine cream Commonly known as:  EMLA Apply to affected area once What changed:    how much to take  how to take this  when to take this  reasons to take this  additional instructions   magnesium (amino acid chelate) 133 MG tablet Take 1 tablet by mouth daily.   metFORMIN 500 MG tablet Commonly known as:  GLUCOPHAGE Take 500 mg by mouth 2 (two) times daily with a meal.   MULTI-VITAMINS Tabs Take 1 tablet by mouth daily.   omeprazole 40 MG capsule Commonly known as:  PRILOSEC Take 40 mg by mouth daily.   prochlorperazine 10 MG tablet Commonly known as:  COMPAZINE Take 1 tablet (10 mg total) by mouth every 6 (six) hours as needed (Nausea or vomiting).   trolamine salicylate 10 % cream Commonly known as:  ASPERCREME Apply 1 application topically as needed for muscle pain.      Follow-up Information    Vickie Epley, MD Follow up in 1 week(s).   Specialty:  General Surgery Contact information: [address] [place] [address] [phone]

## 2018-06-22 NOTE — Progress Notes (Signed)
MD ordered patient to be discharged home.  Discharge instructions were reviewed with the patient and she voiced understanding.  Follow-up appointment was made.  Prescriptions sent to the patients pharmacy.  IV was removed with catheter intact.  All patients questions were answered.  Patient leaving via wheelchair escorted by auxillary.  

## 2018-06-22 NOTE — Care Management Note (Signed)
Case Management Note  Patient Details  Name: Jackie Allison MRN: [id number] Date of Birth: 1941/04/17   Patient to discharge home today with resumption of home health services.  Cassie with [facility] notified of discharge. No further RNCM needs identified.   Subjective/Objective:                    Action/Plan:   Expected Discharge Date:  06/22/18               Expected Discharge Plan:  [place]  In-House Referral:     Discharge planning Services  CM Consult  Post Acute Care Choice:  Home Health, Resumption of Svcs/PTA Provider Choice offered to:  Patient  DME Arranged:    DME Agency:     HH Arranged:  RN, PT HH Agency:  [facility]  Status of Service:  Completed, signed off  If discussed at [place] of Stay Meetings, dates discussed:    Additional Comments:  Beverly Sessions, RN 06/22/2018, 12:33 PM

## 2018-06-23 NOTE — Patient Outreach (Signed)
Antimicrobial Stewardship Positive Culture Follow Up   Jackie Allison is an 78 y.o. female who presented to [facility] on 06/19/2018 with a chief complaint of  Chief Complaint  Patient presents with  . Abscess    Recent Results (from the past 720 hour(s))  Blood Culture (routine x 2)     Status: None (Preliminary result)   Collection Time: 06/19/18 11:29 PM  Result Value Ref Range Status   Specimen Description BLOOD RIGHT FOREARM  Final   Special Requests   Final    BOTTLES DRAWN AEROBIC AND ANAEROBIC Blood Culture adequate volume   Culture   Final    NO GROWTH 3 DAYS Performed at [facility], [address], [place], [place] [address]    Report Status PENDING  Incomplete  Blood Culture (routine x 2)     Status: None (Preliminary result)   Collection Time: 06/19/18 11:29 PM  Result Value Ref Range Status   Specimen Description BLOOD RIGHT HAND  Final   Special Requests   Final    BOTTLES DRAWN AEROBIC AND ANAEROBIC Blood Culture adequate volume   Culture   Final    NO GROWTH 3 DAYS Performed at [facility], [address], [place], [place] [address]    Report Status PENDING  Incomplete  Aerobic/Anaerobic Culture (surgical/deep wound)     Status: None (Preliminary result)   Collection Time: 06/20/18  1:17 PM  Result Value Ref Range Status   Specimen Description   Final    WOUND Performed at [facility], [address] [place], [place] [address]    Special Requests   Final    NONE Performed at [facility], [address] [place], [place] [address]    Gram Stain   Final    FEW WBC PRESENT, PREDOMINANTLY PMN FEW GRAM POSITIVE COCCI IN PAIRS IN CLUSTERS Performed at [facility], [place] [address] [place],  [address]    Culture   Final    FEW METHICILLIN RESISTANT STAPHYLOCOCCUS AUREUS FEW ENTEROCOCCUS FAECALIS NO ANAEROBES ISOLATED; CULTURE IN PROGRESS FOR 5 DAYS    Report Status PENDING  Incomplete   Organism  ID, Bacteria METHICILLIN RESISTANT STAPHYLOCOCCUS AUREUS  Final   Organism ID, Bacteria ENTEROCOCCUS FAECALIS  Final      Susceptibility   Enterococcus faecalis - MIC*    AMPICILLIN <=2 SENSITIVE Sensitive     VANCOMYCIN 1 SENSITIVE Sensitive     GENTAMICIN SYNERGY SENSITIVE Sensitive     * FEW ENTEROCOCCUS FAECALIS   Methicillin resistant staphylococcus aureus - MIC*    CIPROFLOXACIN <=0.5 SENSITIVE Sensitive     ERYTHROMYCIN <=0.25 SENSITIVE Sensitive     GENTAMICIN <=0.5 SENSITIVE Sensitive     OXACILLIN >=4 RESISTANT Resistant     TETRACYCLINE <=1 SENSITIVE Sensitive     VANCOMYCIN <=0.5 SENSITIVE Sensitive     TRIMETH/SULFA <=10 SENSITIVE Sensitive     CLINDAMYCIN <=0.25 SENSITIVE Sensitive     RIFAMPIN <=0.5 SENSITIVE Sensitive     Inducible Clindamycin NEGATIVE Sensitive     * FEW METHICILLIN RESISTANT STAPHYLOCOCCUS AUREUS    [x]  Treated with Augmentin, 1/2 organisms resistant to prescribed antimicrobial (will continue this medication)  New antibiotic prescription: Doxycycline 100mg  bid for 7 days  Provider: Dr Herbert Pun  Adding additional coverage for patient per culture results.  I called and spoke with patient and explained to her that I would be calling in an additional antibiotic for her to her pharmacy and she was to  take it AND the augmentin.  Rx called in to [facility] [address] [place], [place] to [place] @ 2001 2/26   Lu Duffel, PharmD, BCPS Clinical Pharmacist 06/23/2018 8:07 PM

## 2018-06-25 LAB — CULTURE, BLOOD (ROUTINE X 2)
Culture: NO GROWTH
Culture: NO GROWTH
SPECIAL REQUESTS: ADEQUATE
Special Requests: ADEQUATE

## 2018-06-25 LAB — AEROBIC/ANAEROBIC CULTURE W GRAM STAIN (SURGICAL/DEEP WOUND)

## 2018-06-25 LAB — AEROBIC/ANAEROBIC CULTURE (SURGICAL/DEEP WOUND)

## 2018-06-28 ENCOUNTER — Encounter: Payer: Self-pay | Admitting: Oncology

## 2018-06-28 ENCOUNTER — Inpatient Hospital Stay: Payer: Medicare Other

## 2018-06-28 ENCOUNTER — Inpatient Hospital Stay (HOSPITAL_BASED_OUTPATIENT_CLINIC_OR_DEPARTMENT_OTHER): Payer: Medicare Other | Admitting: Hospice and Palliative Medicine

## 2018-06-28 ENCOUNTER — Inpatient Hospital Stay: Payer: Medicare Other | Attending: Radiation Oncology

## 2018-06-28 ENCOUNTER — Other Ambulatory Visit: Payer: Self-pay

## 2018-06-28 ENCOUNTER — Inpatient Hospital Stay (HOSPITAL_BASED_OUTPATIENT_CLINIC_OR_DEPARTMENT_OTHER): Payer: Medicare Other | Admitting: Oncology

## 2018-06-28 VITALS — BP 172/76 | HR 64 | Temp 96.4°F | Resp 18 | Wt 162.0 lb

## 2018-06-28 DIAGNOSIS — C50312 Malignant neoplasm of lower-inner quadrant of left female breast: Secondary | ICD-10-CM

## 2018-06-28 DIAGNOSIS — R600 Localized edema: Secondary | ICD-10-CM | POA: Insufficient documentation

## 2018-06-28 DIAGNOSIS — C50919 Malignant neoplasm of unspecified site of unspecified female breast: Secondary | ICD-10-CM

## 2018-06-28 DIAGNOSIS — D509 Iron deficiency anemia, unspecified: Secondary | ICD-10-CM | POA: Diagnosis not present

## 2018-06-28 DIAGNOSIS — I10 Essential (primary) hypertension: Secondary | ICD-10-CM | POA: Insufficient documentation

## 2018-06-28 DIAGNOSIS — Z7982 Long term (current) use of aspirin: Secondary | ICD-10-CM | POA: Diagnosis not present

## 2018-06-28 DIAGNOSIS — N611 Abscess of the breast and nipple: Secondary | ICD-10-CM | POA: Insufficient documentation

## 2018-06-28 DIAGNOSIS — K219 Gastro-esophageal reflux disease without esophagitis: Secondary | ICD-10-CM | POA: Diagnosis not present

## 2018-06-28 DIAGNOSIS — Z170 Estrogen receptor positive status [ER+]: Secondary | ICD-10-CM | POA: Diagnosis not present

## 2018-06-28 DIAGNOSIS — E871 Hypo-osmolality and hyponatremia: Secondary | ICD-10-CM | POA: Diagnosis not present

## 2018-06-28 DIAGNOSIS — Z7984 Long term (current) use of oral hypoglycemic drugs: Secondary | ICD-10-CM | POA: Diagnosis not present

## 2018-06-28 DIAGNOSIS — Z79899 Other long term (current) drug therapy: Secondary | ICD-10-CM | POA: Insufficient documentation

## 2018-06-28 DIAGNOSIS — E7800 Pure hypercholesterolemia, unspecified: Secondary | ICD-10-CM | POA: Diagnosis not present

## 2018-06-28 DIAGNOSIS — E119 Type 2 diabetes mellitus without complications: Secondary | ICD-10-CM | POA: Diagnosis not present

## 2018-06-28 DIAGNOSIS — Z515 Encounter for palliative care: Secondary | ICD-10-CM | POA: Diagnosis not present

## 2018-06-28 LAB — CBC WITH DIFFERENTIAL/PLATELET
Abs Immature Granulocytes: 0.03 10*3/uL (ref 0.00–0.07)
Basophils Absolute: 0 10*3/uL (ref 0.0–0.1)
Basophils Relative: 0 %
EOS ABS: 0.1 10*3/uL (ref 0.0–0.5)
EOS PCT: 2 %
HCT: 28.2 % — ABNORMAL LOW (ref 36.0–46.0)
Hemoglobin: 9.3 g/dL — ABNORMAL LOW (ref 12.0–15.0)
Immature Granulocytes: 1 %
Lymphocytes Relative: 21 %
Lymphs Abs: 1 10*3/uL (ref 0.7–4.0)
MCH: 23.7 pg — ABNORMAL LOW (ref 26.0–34.0)
MCHC: 33 g/dL (ref 30.0–36.0)
MCV: 71.8 fL — AB (ref 80.0–100.0)
Monocytes Absolute: 0.6 10*3/uL (ref 0.1–1.0)
Monocytes Relative: 14 %
Neutro Abs: 2.9 10*3/uL (ref 1.7–7.7)
Neutrophils Relative %: 62 %
Platelets: 401 10*3/uL — ABNORMAL HIGH (ref 150–400)
RBC: 3.93 MIL/uL (ref 3.87–5.11)
RDW: 16.6 % — AB (ref 11.5–15.5)
WBC: 4.7 10*3/uL (ref 4.0–10.5)
nRBC: 0 % (ref 0.0–0.2)

## 2018-06-28 LAB — COMPREHENSIVE METABOLIC PANEL
ALT: 15 U/L (ref 0–44)
AST: 13 U/L — ABNORMAL LOW (ref 15–41)
Albumin: 3.5 g/dL (ref 3.5–5.0)
Alkaline Phosphatase: 53 U/L (ref 38–126)
Anion gap: 10 (ref 5–15)
BUN: 14 mg/dL (ref 8–23)
CO2: 21 mmol/L — ABNORMAL LOW (ref 22–32)
CREATININE: 0.82 mg/dL (ref 0.44–1.00)
Calcium: 9.3 mg/dL (ref 8.9–10.3)
Chloride: 98 mmol/L (ref 98–111)
GFR calc Af Amer: >60 mL/min (ref >60)
GFR calc non Af Amer: >60 mL/min (ref >60)
Glucose, Bld: 119 mg/dL — ABNORMAL HIGH (ref 70–99)
Potassium: 3.4 mmol/L — ABNORMAL LOW (ref 3.5–5.1)
Sodium: 129 mmol/L — ABNORMAL LOW (ref 135–145)
Total Bilirubin: 0.6 mg/dL (ref 0.3–1.2)
Total Protein: 7.3 g/dL (ref 6.5–8.1)

## 2018-06-28 NOTE — Progress Notes (Signed)
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  Current Treatment: Docetaxel, Cyclophosphamide   Reason for Visit: This patient is a 78 y.o. female who presents to chemo care clinic today for initial meeting in preparation for starting chemotherapy. I introduced the chemo care clinic and we discussed that the role of the clinic is to assist those who are at an increased risk of emergency room visits and/or complications during the course of chemotherapy treatment. We discussed that the increased risk takes into account factors such as age, performance status, and co-morbidities. We also discussed that for some, this might include barriers to care such as not having a primary care provider, lack of insurance/transportation, or not being able to afford medications. We discussed that the goal of the program is to help prevent unplanned ER visits and help reduce complications during chemotherapy. We do this by discussing specific risk factors to each individual and identifying ways that we can help improve these risk factors and reduce barriers to care.   Hematology/Oncology History:    Breast cancer in female [facility])   04/28/2018 Initial Diagnosis    Breast cancer in female [facility])    06/11/2018 Cancer Staging    Staging form: Breast, AJCC 8th Edition - Pathologic stage from 06/11/2018: Stage IIA (pT2, pN0, cM0, G2, ER+, PR-, HER2-, Oncotype DX score: 27) - Signed by Earlie Server, MD on 06/11/2018      Allergies  Allergen Reactions  . Shellfish Allergy Anaphylaxis  . Iodine     Patient unsure if allergic to topical betadine Shellfish allergy with ingestion  . Sulfa Antibiotics Itching     Past Medical  History:  Diagnosis Date  . Anemia    chronic microcytic anemia  . Cancer ([place]) 2019   left breast  . Diabetes mellitus without complication ([place])   . GERD (gastroesophageal reflux disease)   . Glaucoma   . Hypercholesterolemia   . Hypertension      Past Surgical History:  Procedure Laterality Date  . BACK SURGERY    . BREAST BIOPSY Right    stereo- neg  . BREAST BIOPSY Left 02/2018  . BREAST EXCISIONAL BIOPSY Left 04/03/2018   left breast cancer lumpectomy   . BREAST LUMPECTOMY Left 04/03/2018  . CATARACT EXTRACTION W/ INTRAOCULAR LENS  IMPLANT, BILATERAL    . EYE SURGERY Bilateral    cataract extraction  . INCISION AND DRAINAGE ABSCESS Left 04/28/2018   Procedure: INCISION AND DRAINAGE ABSCESS;  Surgeon: Benjamine Sprague, DO;  Location: [facility] ORS;  Service: General;  Laterality: Left;  . INCISION AND DRAINAGE ABSCESS Left 06/20/2018   Procedure: INCISION AND DRAINAGE ABSCESS;  Surgeon: Herbert Pun, MD;  Location: [facility] ORS;  Service: General;  Laterality: Left;  . LUMBAR FUSION  2002?  . LUMBAR LAMINECTOMY  1997   titanium in back  . PARTIAL MASTECTOMY WITH AXILLARY SENTINEL LYMPH NODE BIOPSY Left 04/13/2018   Procedure: PARTIAL MASTECTOMY WITH AXILLARY SENTINEL LYMPH NODE BIOPSY  (No Needle Loc);  Surgeon: Benjamine Sprague, DO;  Location: [facility] ORS;  Service: General;  Laterality: Left;  . RETINAL DETACHMENT SURGERY Bilateral 2009  . TUBAL LIGATION      Social History   Socioeconomic History  . Marital status: Married  Spouse name: [name]  . Number of children: Not on file  . Years of education: Not on file  . Highest education level: Not on file  Occupational History  . Occupation: Restaurant manager, fast food estate  Social Needs  . Financial resource strain: Not on file  . Food insecurity:    Worry: Not on file    Inability: Not on file  . Transportation needs:    Medical: Not on file    Non-medical: Not on file  Tobacco Use  . Smoking status: Never Smoker  . Smokeless  tobacco: Never Used  Substance and Sexual Activity  . Alcohol use: Never    Frequency: Never  . Drug use: Never  . Sexual activity: Not on file  Lifestyle  . Physical activity:    Days per week: Not on file    Minutes per session: Not on file  . Stress: Not on file  Relationships  . Social connections:    Talks on phone: Not on file    Gets together: Not on file    Attends religious service: Not on file    Active member of club or organization: Not on file    Attends meetings of clubs or organizations: Not on file    Relationship status: Not on file  . Intimate partner violence:    Fear of current or ex partner: Not on file    Emotionally abused: Not on file    Physically abused: Not on file    Forced sexual activity: Not on file  Other Topics Concern  . Not on file  Social History Narrative  . Not on file    Family History  Problem Relation Age of Onset  . Diabetes Sister   . Diabetes Brother   . Diabetes Sister   . Breast cancer Other   . Hypertension Mother   . Glaucoma Mother   . Heart attack Father     Current Outpatient Medications  Medication Sig Dispense Refill  . acetaminophen (TYLENOL) 500 MG tablet Take 500 mg by mouth every 6 (six) hours as needed for moderate pain.     Marland Kitchen amoxicillin-clavulanate (AUGMENTIN) 875-125 MG tablet Take 1 tablet by mouth 2 (two) times daily for 7 days. 14 tablet 0  . aspirin 81 MG EC tablet Take 81 mg by mouth daily.     Marland Kitchen atorvastatin (LIPITOR) 10 MG tablet Take 10 mg by mouth every evening.     . benazepril (LOTENSIN) 40 MG tablet Take 40 mg by mouth daily.     . brimonidine (ALPHAGAN) 0.2 % ophthalmic solution Place 1 drop into the right eye 2 (two) times daily.     . carvedilol (COREG) 12.5 MG tablet Take 12.5 mg by mouth 2 (two) times daily with a meal.     . chlorhexidine (PERIDEX) 0.12 % solution Use as directed 15 mLs in the mouth or throat 2 (two) times daily.   99  . Coenzyme Q10 (COQ10 PO) Take 1 tablet by mouth  daily.    Marland Kitchen dexamethasone (DECADRON) 4 MG tablet Take 2 tablets (8 mg total) by mouth 2 (two) times daily. Start the day before Taxotere. Then again the day after chemo for 3 days. (Patient not taking: Reported on 06/28/2018) 30 tablet 1  . docusate sodium (COLACE) 100 MG capsule Take 100 mg by mouth daily as needed for mild constipation.    . dorzolamide-timolol (COSOPT) 22.3-6.8 MG/ML ophthalmic solution Place 1 drop into both eyes 2 (two) times daily.    Marland Kitchen  ferrous sulfate 325 (65 FE) MG EC tablet Take 1 tablet (325 mg total) by mouth 2 (two) times daily with a meal. (Patient taking differently: Take 325 mg by mouth daily. ) 30 tablet 3  . hydrALAZINE (APRESOLINE) 100 MG tablet Take 100 mg by mouth 3 (three) times daily.   3  . HYDROcodone-acetaminophen (NORCO/VICODIN) 5-325 MG tablet Take 1-2 tablets by mouth every 4 (four) hours as needed for moderate pain. 30 tablet 0  . ibuprofen (ADVIL,MOTRIN) 800 MG tablet Take 1 tablet (800 mg total) by mouth every 8 (eight) hours as needed for mild pain or moderate pain. 30 tablet 0  . latanoprost (XALATAN) 0.005 % ophthalmic solution Place 1 drop into both eyes at bedtime.     . lidocaine-prilocaine (EMLA) cream Apply to affected area once (Patient taking differently: Apply 1 application topically daily as needed (port access). ) 30 g 3  . metFORMIN (GLUCOPHAGE) 500 MG tablet Take 500 mg by mouth 2 (two) times daily with a meal.     . Multiple Vitamin (MULTI-VITAMINS) TABS Take 1 tablet by mouth daily.     Marland Kitchen omeprazole (PRILOSEC) 40 MG capsule Take 40 mg by mouth daily.     . prochlorperazine (COMPAZINE) 10 MG tablet Take 1 tablet (10 mg total) by mouth every 6 (six) hours as needed (Nausea or vomiting). 30 tablet 1  . Specialty Vitamins Products (MAGNESIUM, AMINO ACID CHELATE,) 133 MG tablet Take 1 tablet by mouth daily.    Marland Kitchen trolamine salicylate (ASPERCREME) 10 % cream Apply 1 application topically as needed for muscle pain.     No current  facility-administered medications for this visit.      PERFORMANCE STATUS (ECOG) : 0 - Asymptomatic  Review of Systems As noted above. Otherwise, a complete review of systems is negative.  Physical Exam General: NAD, frail appearing Pulmonary: unlabored Abdomen: soft, nontender, + bowel sounds GU: no suprapubic tenderness Extremities: BLE edema Skin: no rashes Neurological: Weakness but otherwise nonfocal   Assessment and Plan:    1. 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 Tasia Catchings.   2. High Risk for ER/Hospitalization during Chemotherapy: We discussed the role of the chemo care clinic and identified patient specific risk factors. I discussed that patient was identified as high risk primarily based on: h/o ER visits and admissions. We also discussed the role of the Symptom Management and Palliative Care Clinics at [facility] and methods of contacting clinic/provider. She denies needing specific assistance at this time.  Current PCP: Dion Body, MD  Hospital Admissions: 2  ED Visits: 3  Has Medicaid: No  Has Medicare: Yes  In relationship: Yes  Has Anemia: Yes  Has asthma: No  Has atrial fibrillation: No  Has CVD: No  Has chronic kidney disease: No  Has Chronic Obstructive Pulmonary Disease: No  Has Congestive Heart Failure: No  Has Connective Tissue Disorder: No  Has Depression: No  Has Diabetes: Yes  Has liver disease: No  Has Peripheral Vascular Disease: No     3. 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  Discussed resources available in the [place] including support groups and counseling  Disabilities - none identified   4. Co-morbidities Complicating Care: Has left breast abscess that is currently being treated with antibiotics. She is s/p I&D. Cannot start chemotx until port is placed following resolution of the abscess. Patient says that she intends to start chemotx but would likely stop if the symptom burden became too high. Discussed future role of palliative care if needed.   Case discussed with Dr. Tasia Catchings.   Patient expressed understanding and was in agreement with this plan. She also understands that She can call clinic at any time with any questions, concerns, or complaints.   A total of (15) minutes of face-to-face time was spent with this patient with greater than 50% of that time in counseling and care-coordination.   Signed by: Altha Harm, PhD, DNP, NP-C, [facility] [phone] (Work Cell)

## 2018-06-28 NOTE — Progress Notes (Signed)
Hematology/Oncology follow up note [facility] [phone] [phone]   Patient Care Team: Dion Body, MD as PCP - General (Family Medicine)  CHIEF COMPLAINTS/REASON FOR VISIT:  Follow up for management  of breast cancer  HISTORY OF PRESENTING ILLNESS:  Jackie Allison is a  78 y.o.  female with PMH listed below who was referred to me for evaluation of breast cancer Self palpated left breast mass for 1 month.  Patient had mammogram and ultrasound on 03/11/2018 which showed suspicious left breast mass, indeterminate left aixllary lymph node.  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  Nipple discharge: denies.  Family history: great niece had breast cancer OCP use: denies.  Estrogen and progesterone therapy: denies History of radiation to chest: denies.  Previous breast surgery: previous right breast biopsy.   #  04/13/2018 underwent left lumpectomy and sentinel lymph node biopsy on Pathology showed invasive mammary carcinoma, negative margin, sentinel lymph nodes negative 0/5,  Grade 2, ER positive, PR negative HER-2 negative[performed on previous biopsy].Lymphovascular invasion: Not identified  Patient developed left breast abscess and infection status post I&D and debridement on 04/28/2018  INTERVAL HISTORY Jackie Allison is a 78 y.o. female who has above history reviewed by me today presents for follow up visit for management of stage IIA breast cancer.  During her last visit we discussed about her Oncotype DX score being high risk and the patient desires chemotherapy. Patient was sent to surgery service for placement of Mediport My CMA Almyra Free accidentally referred to Dr. Rosana Hoes instead of Dr. Lysle Pearl for Mediport placement.  She realized her mistake 1 hour after the referral and a communicated with Dr. Rosana Hoes office to discard the referral.  I do not know what happened and  patient ends up seeing Dr. Rosana Hoes for an opinion for Mediport placement.  Patient told me that after she sees Dr. Rosana Hoes, she decided to have a second opinion with Dr. Rosana Hoes and asked Dr. Rosana Hoes to place the [place].   Dr. Rosana Hoes noticed that patient still has left axillary erythema and concerning for ongoing infection and Mediport placement was held.  Patient was started on oral antibiotics..  Patient went to emergency room on 06/19/2018 for evaluation of worsening of left breast pain.  She was found to have left breast abscess with associated cellulitis.  Consultation was put into general surgery and Dr. Windell Moment saw patient during the admission.  Patient was given IV antibiotics and patient was also taken to operating room for incision and drainage.  Surgical wound culture positive for MRSA, as well as enterococcus faecalis. Patient was discharged on 06/22/2018.  Today patient presents for follow-up.  Reports that left breast pain has improved.  She has been taking Augmentin 875/125 mg twice daily and will finish 7 days course.  Denies any fever, chills.  There is drainage from her left axillary drainage tube and if she changes dressing.  She felt weak since her surgery which has not resolved to her baseline. Reports having bilateral lower extremity edema since patient.  Review of Systems  Constitutional: Positive for fatigue. Negative for appetite change, chills and fever.  HENT:   Negative for hearing loss and voice change.   Eyes: Negative for eye problems.  Respiratory: Negative for chest tightness and cough.   Cardiovascular: Positive for leg swelling. Negative for chest pain.  Gastrointestinal: Negative for abdominal distention, abdominal pain and blood in stool.  Endocrine: Negative for hot  flashes.  Genitourinary: Negative for difficulty urinating and frequency.   Musculoskeletal: Negative for arthralgias.       Chronic back pain  Skin: Negative for itching and rash.       Left axillary  draining tube, left breast pain has improved..  Neurological: Negative for extremity weakness.  Hematological: Negative for adenopathy.  Psychiatric/Behavioral: Negative for confusion.    MEDICAL HISTORY:  Past Medical History:  Diagnosis Date  . Anemia    chronic microcytic anemia  . Cancer ([place]) 2019   left breast  . Diabetes mellitus without complication ([place])   . GERD (gastroesophageal reflux disease)   . Glaucoma   . Hypercholesterolemia   . Hypertension     SURGICAL HISTORY: Past Surgical History:  Procedure Laterality Date  . BACK SURGERY    . BREAST BIOPSY Right    stereo- neg  . BREAST BIOPSY Left 02/2018  . BREAST EXCISIONAL BIOPSY Left 04/03/2018   left breast cancer lumpectomy   . BREAST LUMPECTOMY Left 04/03/2018  . CATARACT EXTRACTION W/ INTRAOCULAR LENS  IMPLANT, BILATERAL    . EYE SURGERY Bilateral    cataract extraction  . INCISION AND DRAINAGE ABSCESS Left 04/28/2018   Procedure: INCISION AND DRAINAGE ABSCESS;  Surgeon: Benjamine Sprague, DO;  Location: [facility] ORS;  Service: General;  Laterality: Left;  . INCISION AND DRAINAGE ABSCESS Left 06/20/2018   Procedure: INCISION AND DRAINAGE ABSCESS;  Surgeon: Herbert Pun, MD;  Location: [facility] ORS;  Service: General;  Laterality: Left;  . LUMBAR FUSION  2002?  . LUMBAR LAMINECTOMY  1997   titanium in back  . PARTIAL MASTECTOMY WITH AXILLARY SENTINEL LYMPH NODE BIOPSY Left 04/13/2018   Procedure: PARTIAL MASTECTOMY WITH AXILLARY SENTINEL LYMPH NODE BIOPSY  (No Needle Loc);  Surgeon: Benjamine Sprague, DO;  Location: [facility] ORS;  Service: General;  Laterality: Left;  . RETINAL DETACHMENT SURGERY Bilateral 2009  . TUBAL LIGATION      SOCIAL HISTORY: Social History   Socioeconomic History  . Marital status: Married    Spouse name: [name]  . Number of children: Not on file  . Years of education: Not on file  . Highest education level: Not on file  Occupational History  . Occupation: Restaurant manager, fast food estate  Social  Needs  . Financial resource strain: Not on file  . Food insecurity:    Worry: Not on file    Inability: Not on file  . Transportation needs:    Medical: Not on file    Non-medical: Not on file  Tobacco Use  . Smoking status: Never Smoker  . Smokeless tobacco: Never Used  Substance and Sexual Activity  . Alcohol use: Never    Frequency: Never  . Drug use: Never  . Sexual activity: Not on file  Lifestyle  . Physical activity:    Days per week: Not on file    Minutes per session: Not on file  . Stress: Not on file  Relationships  . Social connections:    Talks on phone: Not on file    Gets together: Not on file    Attends religious service: Not on file    Active member of club or organization: Not on file    Attends meetings of clubs or organizations: Not on file    Relationship status: Not on file  . Intimate partner violence:    Fear of current or ex partner: Not on file    Emotionally abused: Not on file    Physically abused: Not on file  Forced sexual activity: Not on file  Other Topics Concern  . Not on file  Social History Narrative  . Not on file    FAMILY HISTORY: Family History  Problem Relation Age of Onset  . Diabetes Sister   . Diabetes Brother   . Diabetes Sister   . Breast cancer Other   . Hypertension Mother   . Glaucoma Mother   . Heart attack Father     ALLERGIES:  is allergic to shellfish allergy; iodine; and sulfa antibiotics.  MEDICATIONS:  Current Outpatient Medications  Medication Sig Dispense Refill  . acetaminophen (TYLENOL) 500 MG tablet Take 500 mg by mouth every 6 (six) hours as needed for moderate pain.     Marland Kitchen amoxicillin-clavulanate (AUGMENTIN) 875-125 MG tablet Take 1 tablet by mouth 2 (two) times daily for 7 days. 14 tablet 0  . aspirin 81 MG EC tablet Take 81 mg by mouth daily.     Marland Kitchen atorvastatin (LIPITOR) 10 MG tablet Take 10 mg by mouth every evening.     . benazepril (LOTENSIN) 40 MG tablet Take 40 mg by mouth daily.     .  brimonidine (ALPHAGAN) 0.2 % ophthalmic solution Place 1 drop into the right eye 2 (two) times daily.     . carvedilol (COREG) 12.5 MG tablet Take 12.5 mg by mouth 2 (two) times daily with a meal.     . chlorhexidine (PERIDEX) 0.12 % solution Use as directed 15 mLs in the mouth or throat 2 (two) times daily.   99  . Coenzyme Q10 (COQ10 PO) Take 1 tablet by mouth daily.    Marland Kitchen docusate sodium (COLACE) 100 MG capsule Take 100 mg by mouth daily as needed for mild constipation.    . dorzolamide-timolol (COSOPT) 22.3-6.8 MG/ML ophthalmic solution Place 1 drop into both eyes 2 (two) times daily.    . ferrous sulfate 325 (65 FE) MG EC tablet Take 1 tablet (325 mg total) by mouth 2 (two) times daily with a meal. (Patient taking differently: Take 325 mg by mouth daily. ) 30 tablet 3  . hydrALAZINE (APRESOLINE) 100 MG tablet Take 100 mg by mouth 3 (three) times daily.   3  . HYDROcodone-acetaminophen (NORCO/VICODIN) 5-325 MG tablet Take 1-2 tablets by mouth every 4 (four) hours as needed for moderate pain. 30 tablet 0  . ibuprofen (ADVIL,MOTRIN) 800 MG tablet Take 1 tablet (800 mg total) by mouth every 8 (eight) hours as needed for mild pain or moderate pain. 30 tablet 0  . latanoprost (XALATAN) 0.005 % ophthalmic solution Place 1 drop into both eyes at bedtime.     . lidocaine-prilocaine (EMLA) cream Apply to affected area once (Patient taking differently: Apply 1 application topically daily as needed (port access). ) 30 g 3  . metFORMIN (GLUCOPHAGE) 500 MG tablet Take 500 mg by mouth 2 (two) times daily with a meal.     . Multiple Vitamin (MULTI-VITAMINS) TABS Take 1 tablet by mouth daily.     Marland Kitchen omeprazole (PRILOSEC) 40 MG capsule Take 40 mg by mouth daily.     . prochlorperazine (COMPAZINE) 10 MG tablet Take 1 tablet (10 mg total) by mouth every 6 (six) hours as needed (Nausea or vomiting). 30 tablet 1  . Specialty Vitamins Products (MAGNESIUM, AMINO ACID CHELATE,) 133 MG tablet Take 1 tablet by mouth daily.     Marland Kitchen trolamine salicylate (ASPERCREME) 10 % cream Apply 1 application topically as needed for muscle pain.    Marland Kitchen dexamethasone (DECADRON) 4 MG  tablet Take 2 tablets (8 mg total) by mouth 2 (two) times daily. Start the day before Taxotere. Then again the day after chemo for 3 days. (Patient not taking: Reported on 06/28/2018) 30 tablet 1   No current facility-administered medications for this visit.      PHYSICAL EXAMINATION: ECOG PERFORMANCE STATUS: 1 - Symptomatic but completely ambulatory Vitals:   06/28/18 0854  BP: (!) 172/76  Pulse: 64  Resp: 18  Temp: (!) 96.4 F (35.8 C)   Filed Weights   06/28/18 0854  Weight: 162 lb (73.5 kg)    Physical Exam Constitutional:      General: She is not in acute distress. HENT:     Head: Normocephalic and atraumatic.  Eyes:     General: No scleral icterus.    Pupils: Pupils are equal, round, and reactive to light.  Neck:     Musculoskeletal: Normal range of motion and neck supple.  Cardiovascular:     Rate and Rhythm: Normal rate and regular rhythm.     Heart sounds: Normal heart sounds.  Pulmonary:     Effort: Pulmonary effort is normal. No respiratory distress.     Breath sounds: No wheezing.  Abdominal:     General: Bowel sounds are normal. There is no distension.     Palpations: Abdomen is soft. There is no mass.     Tenderness: There is no abdominal tenderness.  Musculoskeletal: Normal range of motion.        General: Swelling present. No deformity.  Skin:    General: Skin is warm and dry.     Findings: No erythema or rash.  Neurological:     Mental Status: She is alert and oriented to person, place, and time.     Cranial Nerves: No cranial nerve deficit.     Coordination: Coordination normal.  Psychiatric:        Behavior: Behavior normal.        Thought Content: Thought content normal.   left axillary +draning tube covered by dressing, some serous drainage. Mild erythema.  Left breast mild erythema.    LABORATORY  DATA:  I have reviewed the data as listed Lab Results  Component Value Date   WBC 4.7 06/28/2018   HGB 9.3 (L) 06/28/2018   HCT 28.2 (L) 06/28/2018   MCV 71.8 (L) 06/28/2018   PLT 401 (H) 06/28/2018   Recent Labs    06/20/18 0009 06/20/18 1105 06/21/18 0540 06/28/18 0826  NA 124* 126* 128* 129*  K 3.3*  --  3.6 3.4*  CL 94*  --  98 98  CO2 21*  --  19* 21*  GLUCOSE 121*  --  208* 119*  BUN 9  --  14 14  CREATININE 0.57  --  0.75 0.82  CALCIUM 8.6*  --  8.6* 9.3  GFRNONAA >60  --  >60 >60  GFRAA >60  --  >60 >60  PROT 6.4*  --  6.4* 7.3  ALBUMIN 3.0*  --  2.7* 3.5  AST 13*  --  16 13*  ALT 13  --  16 15  ALKPHOS 60  --  58 53  BILITOT 0.5  --  0.5 0.6   Iron/TIBC/Ferritin/ %Sat    Component Value Date/Time   IRON 51 06/02/2018 1120   TIBC 332 06/02/2018 1120   FERRITIN 15 06/02/2018 1120   IRONPCTSAT 15 06/02/2018 1120     RADIOGRAPHIC STUDIES: I have personally reviewed the radiological images as listed and agreed with  the findings in the report. 03/11/2018 Diagnostic Mammogram and [place]. A radiopaque BB was placed at the site of the patient's palpable lump in the lower inner left breast at far posterior depth. A spiculated hyperdense mass is seen deep to the radiopaque BB along the chest wall. No additional suspicious findings are identified within either breast. Further evaluation with ultrasound was performed. Mammographic images were processed with CAD.  On physical exam, I palpate a firm, fixed 2-3 cm mass along the inframammary fold of the 7 o'clock position. Targeted ultrasound is performed, showing an irregular hypoechoic mass with posterior acoustic shadowing at the 7:30 position 11 cm from the nipple. It measures 2.7 x 2.5 x 2.0 cm. There is associated vascularity. This correlates well with the mammographic finding. Evaluation of the axilla demonstrates a single axillary lymph node with borderline cortical thickening of 0.4 cm. IMPRESSION: 1. Highly  suspicious left breast mass corresponding with the patient's palpable lump. Recommendation is for ultrasound-guided biopsy. 2. Indeterminate left axillary lymph node. Recommendation is for ultrasound-guided biopsy.  Same as pre-op    ASSESSMENT & PLAN:  1. Malignant neoplasm of lower-inner quadrant of left breast in female, estrogen receptor positive (HCC)   2. Leg edema   3. Hyponatremia   4. Microcytic anemia   Cancer Staging Breast cancer in female [facility]) Staging form: Breast, AJCC 8th Edition - Clinical: No stage assigned - Unsigned - Pathologic stage from 06/11/2018: Stage IIA (pT2, pN0, cM0, G2, ER+, PR-, HER2-, Oncotype DX score: 27) - Signed by Earlie Server, MD on 06/11/2018  #Left breast abscess and cellulitis, continue and finish her course.  Surgery to ensure complete resolution of Discussed with her that she fully recovers.  Wound culture positive for MRSA, defer surgery for antibiotic switch.   # pT2 pN0 ER positive invasive mammary carcinoma. Oncotype DX recurrence score came back at 27, predicting distant recurrence risk at 9 years with tamoxifen alone around 16%.  Group average absolute chemotherapy benefit >15%.  We have previously had a discussion about rationale of chemotherapy patient declines treatment.  She will need to have a Mediport She tells me that she met Dr. Windell Moment during her admission, she would like to follow-up with him and may also consider to have medi- port procedure by Dr.Cintron-Diaz.  I remind patient that she is still on Dr. Rosana Hoes schedule for follow-up and port placement.Patient thought surgeons are within the same group.  Patient was made aware that Dr. Windell Moment and Dr. Rosana Hoes work for different surgical groups.   It will be her decision who she decides to follow-up with.  I encourage patient to follow-up with one surgery group for continuity of her care.  I also communicated and updated Dr. Lysle Pearl, Dr. Rosana Hoes, Dr.Cintron Ferrel Logan via secure chat.   For  now will hold chemotherapy. I ask patient to give me a call and update me once her wound infection has completely resolved and medi port been placed.   # Microcytic anemia, iron deficient. Continue oral iron supplementation for now. Repeat iron panel at next visit.    # Hyponatremia ,chronic.  # Bilateral leg edema, check bilateral Venous Duplex to r/o DVT.   Orders Placed This Encounter  Procedures  . US Venous Img Lower Bilateral    Standing Status:   Future    Standing Expiration Date:   06/28/2019    Order Specific Question:   Reason for Exam (SYMPTOM  OR DIAGNOSIS REQUIRED)    Answer:   bilateral LE edema  Order Specific Question:   Preferred imaging location?    Answer:   [facility]  . Retic Panel    Standing Status:   Future    Standing Expiration Date:   06/28/2019    Return of visit: to be determined. Patient will call us after medi port is placed.   We spent sufficient time to discuss many aspect of care, questions were answered to patient's satisfaction. Total face to face encounter time for this patient visit was 25 min. >50% of the time was  spent in counseling and coordination of care.   Earlie Server, MD, PhD Hematology Oncology [facility] at [facility] Pager- [phone] 06/28/2018

## 2018-06-29 ENCOUNTER — Ambulatory Visit
Admission: RE | Admit: 2018-06-29 | Discharge: 2018-06-29 | Disposition: A | Discharge status: Home / Self Care | Payer: Medicare Other | Source: Ambulatory Visit | Attending: Oncology | Admitting: Oncology

## 2018-06-29 ENCOUNTER — Encounter: Payer: Self-pay | Admitting: Surgery

## 2018-06-29 ENCOUNTER — Other Ambulatory Visit: Payer: Self-pay

## 2018-06-29 ENCOUNTER — Ambulatory Visit (INDEPENDENT_AMBULATORY_CARE_PROVIDER_SITE_OTHER): Payer: Medicare Other | Admitting: Surgery

## 2018-06-29 VITALS — BP 188/80 | HR 74 | Temp 97.5°F | Resp 16 | Ht 67.0 in | Wt 162.0 lb

## 2018-06-29 DIAGNOSIS — R600 Localized edema: Secondary | ICD-10-CM | POA: Diagnosis not present

## 2018-06-29 DIAGNOSIS — C50912 Malignant neoplasm of unspecified site of left female breast: Secondary | ICD-10-CM

## 2018-06-29 DIAGNOSIS — Z170 Estrogen receptor positive status [ER+]: Secondary | ICD-10-CM | POA: Diagnosis not present

## 2018-06-29 DIAGNOSIS — L02419 Cutaneous abscess of limb, unspecified: Secondary | ICD-10-CM

## 2018-06-29 NOTE — Progress Notes (Signed)
Surgical Clinic Progress/Follow-up Note   HPI:  78 y.o. Female presents to clinic for follow-up evaluation 9 Days s/p incision and drainage of Left axillary abscess (Cintron-Diaz, 06/20/2018), 10 Weeks s/p Left breast lumpectomy with sentinel lymph node excisional biopsy Lysle Pearl, 04/13/2018), and 8 weeks s/p incision/drainage of Left breast abscess with application of negative pressure wound VAC dressing Lysle Pearl, 04/28/2018). Patient reports minimal further drainage from her Left axillary drain over the past several days with minimal Left axillary pain in comparison to previously severe Left axillary peri-incisional pain prior to incision and drainage. Patient otherwise denies any fever/chills, N/V, CP, or SOB. She and her husband clearly and consistently state she'd at least like to attempt chemotherapy if it offers any potential to reduce her risk of recurrent cancer (as suggested by Oncotype score of 27, >25).  Review of Systems:  Constitutional: denies any other weight loss, fever, chills, or sweats  Eyes: denies any other vision changes, history of eye injury  ENT: denies sore throat, hearing problems  Respiratory: denies shortness of breath, wheezing  Cardiovascular: denies chest pain, palpitations Breast: Left breast pain, axillary drainage and redness as per interval history Gastrointestinal: denies abdominal pain, N/V, or diarrhea Musculoskeletal: denies any other joint pains or cramps  Skin: Denies any other rashes or skin discolorations  Neurological: denies any other headache, dizziness, weakness  Psychiatric: denies any other depression, anxiety  All other review of systems: otherwise negative   Vital Signs:  BP (!) 188/80   Pulse 74   Temp (!) 97.5 F (36.4 C) (Skin)   Resp 16   Ht 5\' 7"  (3.254 m)   Wt 162 lb (73.5 kg)   SpO2 95%   BMI 25.37 kg/m    Physical Exam:  Constitutional:  -- Normal body habitus  -- Awake, alert, and oriented x3  Eyes:  -- Pupils equally  round and reactive to light  -- No scleral icterus  Ear, nose, throat:  -- No jugular venous distension  -- No nasal drainage, bleeding Pulmonary:  -- No crackles -- Equal breath sounds bilaterally -- Breathing non-labored at rest Cardiovascular:  -- S1, S2 present  -- No pericardial rubs Breast: -- Left breast inframammary post-surgical wound with prior abscess/infection well-approximated without surrounding erythema or drainage -- No palpable Left breast mass or tenderness to palpation Gastrointestinal:  -- Soft, nontender, non-distended, no guarding/rebound  -- No abdominal masses appreciated, pulsatile or otherwise  Musculoskeletal / Integumentary:  -- Wounds or skin discoloration: 1/4" Penrose drain well-secured, mildly tender to palpation without surrounding erythema or any appreciable drainage (purulent or otherwise), small amount of fibrinous exudate and debris immediately inside superficial aspect of wound -- Extremities: B/L UE and LE FROM, hands and feet warm, no upper extremity edema  Neurologic:  -- Motor function: intact and symmetric  -- Sensation: intact and symmetric   Laboratory studies:  CBC Latest Ref Rng & Units 06/28/2018 06/21/2018 06/20/2018  WBC 4.0 - 10.5 K/uL 4.7 9.7 9.6  Hemoglobin 12.0 - 15.0 g/dL 9.3(L) 8.9(L) 8.4(L)  Hematocrit 36.0 - 46.0 % 28.2(L) 27.0(L) 25.3(L)  Platelets 150 - 400 K/uL 401(H) 309 309   CMP Latest Ref Rng & Units 06/28/2018 06/21/2018 06/20/2018  Glucose 70 - 99 mg/dL 119(H) 208(H) -  BUN 8 - 23 mg/dL 14 14 -  Creatinine 0.44 - 1.00 mg/dL 0.82 0.75 -  Sodium 135 - 145 mmol/L 129(L) 128(L) 126(L)  Potassium 3.5 - 5.1 mmol/L 3.4(L) 3.6 -  Chloride 98 - 111 mmol/L 98 98 -  CO2 22 - 32 mmol/L 21(L) 19(L) -  Calcium 8.9 - 10.3 mg/dL 9.3 8.6(L) -  Total Protein 6.5 - 8.1 g/dL 7.3 6.4(L) -  Total Bilirubin 0.3 - 1.2 mg/dL 0.6 0.5 -  Alkaline Phos 38 - 126 U/L 53 58 -  AST 15 - 41 U/L 13(L) 16 -  ALT 0 - 44 U/L 15 16 -   Imaging: No  new pertinent imaging studies available for review at this time   Assessment:  78 y.o. yo Female with a problem list including...  Patient Active Problem List   Diagnosis Date Noted  . Breast abscess 06/20/2018  . Breast cancer in female [facility]) 04/28/2018  . Lymphedema 02/14/2018  . 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 Hyperlipidemia 02/11/2018  . Diabetes ([place]) 02/11/2018    presents to clinic for follow-up evaluation, doing overall well 9 Days s/p incision and drainage of Left axillary abscess (Cintron-Diaz, 06/20/2018), 10 Weeks s/p Left breast lumpectomy with excisional sentinel lymph node biopsy Lysle Pearl, 04/13/2018), and 8 weeks s/p incision/drainage of Left breast abscess with application of negative pressure wound VAC Lysle Pearl, 04/28/2018).  Plan:   - 1/4" Penrose drain removed  - complete prescribed antibiotics as prescribed  - Left axillary wound debris evacuated with forceps and with repeated insertion/removal of dry gauze  - remove Left axillary dry gauze in 2 days, after which may/should shower again with soap and water  - return to clinic 2 weeks to reassess Left axillary wound and to likely consider and schedule for port insertion  - patient and her husband instructed to call office if any questions or concerns (worse pain, drainage, redness, fever or similar concerns)  - planning for chemotherapy as per Dr. Tasia Catchings  All of the above recommendations were discussed with the patient and her husband, and all of patient's and family's questions were answered to their expressed satisfaction.  -- Marilynne Drivers Rosana Hoes, MD, [place]: [facility] - Partnering for exceptional care. Office: [phone]

## 2018-06-29 NOTE — Patient Instructions (Addendum)
Return in two  week. Take packing out in two days. The patient is aware to call back for any questions or concerns.

## 2018-07-13 ENCOUNTER — Encounter: Payer: Self-pay | Admitting: Surgery

## 2018-07-13 ENCOUNTER — Other Ambulatory Visit: Payer: Self-pay

## 2018-07-13 ENCOUNTER — Telehealth: Payer: Self-pay | Admitting: Oncology

## 2018-07-13 ENCOUNTER — Other Ambulatory Visit: Payer: Self-pay | Admitting: Oncology

## 2018-07-13 ENCOUNTER — Ambulatory Visit (INDEPENDENT_AMBULATORY_CARE_PROVIDER_SITE_OTHER): Payer: Medicare Other | Admitting: Surgery

## 2018-07-13 VITALS — BP 151/76 | HR 79 | Temp 97.2°F | Ht 67.0 in | Wt 152.0 lb

## 2018-07-13 DIAGNOSIS — Z170 Estrogen receptor positive status [ER+]: Secondary | ICD-10-CM | POA: Diagnosis not present

## 2018-07-13 DIAGNOSIS — C50912 Malignant neoplasm of unspecified site of left female breast: Secondary | ICD-10-CM | POA: Diagnosis not present

## 2018-07-13 DIAGNOSIS — L02412 Cutaneous abscess of left axilla: Secondary | ICD-10-CM | POA: Diagnosis not present

## 2018-07-13 NOTE — Progress Notes (Signed)
Surgical Clinic Progress/Follow-up Note   HPI:  78 y.o. Female presents to clinic for follow-up evaluation of her Left axillary abscess s/p Left breast lumpectomy with Left axillary sentinel lymph node biopsy for breast cancer prior to placement of CVC with subcutaneous port for same. Patient describes her Left axillary pain has resolved and Left inframammary crease wound healed. She otherwise denies fever, CP, or SOB, though she expresses concern regarding initiation of chemotherapy during Covid-19 coronavirus outbreak. She denies any flu-like symptoms.  Review of Systems:  Constitutional: denies any other weight loss, fever, chills, or sweats  Eyes: denies any other vision changes, history of eye injury  ENT: denies sore throat, hearing problems  Respiratory: denies shortness of breath, wheezing  Cardiovascular: denies chest pain, palpitations Breast: pain and wounds as per interval history Gastrointestinal: denies abdominal pain, N/V, or diarrhea Musculoskeletal: denies any other joint pains or cramps  Skin: Denies any other rashes or skin discolorations except as per interval history Neurological: denies any other headache, dizziness, weakness  Psychiatric: denies any other depression, anxiety  All other review of systems: otherwise negative   Vital Signs:  BP (!) 151/76   Pulse 79   Temp (!) 97.2 F (36.2 C) (Temporal)   Ht 5\' 7"  (1.702 m)   Wt 152 lb (68.9 kg)   SpO2 98%   BMI 23.81 kg/m    Physical Exam:  Constitutional:  -- Normal body habitus  -- Awake, alert, and oriented x3  Eyes:  -- Pupils equally round and reactive to light  -- No scleral icterus  Ear, nose, throat:  -- No jugular venous distension  -- No nasal drainage, bleeding Pulmonary:  -- No crackles -- Equal breath sounds bilaterally -- Breathing non-labored at rest Cardiovascular:  -- S1, S2 present  -- No pericardial rubs Breast: -- Left breast infra-mammary crease post-surgical wound  well-approximated and healing well without tenderness to palpation, surrounding erythema, or drainage -- No nipple drainage or palpable Left breast mass appreciated -- Left axilla no longer tender to palpation with axillary incision and drainage site now shallow/superficial without surrounding erythema or any purulent drainage, though continues to heal Gastrointestinal:  -- Soft, nontender, non-distended, no guarding/rebound  -- No abdominal masses appreciated, pulsatile or otherwise  Musculoskeletal / Integumentary:  -- Wounds or skin discoloration: None appreciated except as described above (Breast)  -- Extremities: B/L UE and LE FROM, hands and feet warm, no edema  Neurologic:  -- Motor function: intact and symmetric  -- Sensation: intact and symmetric   Laboratory studies:  CBC Latest Ref Rng & Units 06/28/2018 06/21/2018 06/20/2018  WBC 4.0 - 10.5 K/uL 4.7 9.7 9.6  Hemoglobin 12.0 - 15.0 g/dL 9.3(L) 8.9(L) 8.4(L)  Hematocrit 36.0 - 46.0 % 28.2(L) 27.0(L) 25.3(L)  Platelets 150 - 400 K/uL 401(H) 309 309   Imaging: No new pertinent imaging studies available for review at this time   Assessment:  78 y.o. yo Female with a problem list including...  Patient Active Problem List   Diagnosis Date Noted  . Breast cancer in female [facility]) 04/28/2018  . Lymphedema 02/14/2018  . 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 Hyperlipidemia 02/11/2018  . Diabetes ([place]) 02/11/2018    presents to clinic for follow-up evaluation, doing overall well nearly 4 weeks s/p incision and drainage of Left axillary abscess (Cintron-Diaz, 06/20/2018), 13 Weeks s/p Left breast lumpectomy with excisional sentinel lymph node biopsy Lysle Pearl, 04/13/2018) for invasive breast carcinoma, and 11 weeks s/p incision/drainage of Left breast abscess with application of negative  pressure wound VAC Lysle Pearl, 04/28/2018).  Plan:   - discussed patient's concerns regarding initiation of chemotherapy in context of Covid-19 coronavirus outbreak  with Dr. Tasia Catchings, who plans to discuss this with patient and her husband  - patient does not require additional pre-surgery appointment prior to scheduling port placement if no new/further Left axillary pain, redness, or drainage  - may continue to place a dry gauze over Left axillary incision once daily to protect clothing from any drainage, though may discontinue dressing if no further drainage on gauze  - all risks, benefits, and alternatives to placement of central venous catheter with subcutaneous port for chemotherapy were once again discussed with the patient and her husband, all of their questions were answered to their expressed satisfaction, patient expresses she wishes to proceed with port placement if she continues to proceed with chemotherapy as previously elected, and informed consent was again obtained accordingly  - will plan to proceed with port placement pending guidance regarding timing of chemotherapy from Dr. Tasia Catchings  - patient instructed to call office if any questions or concerns  All of the above recommendations were discussed with the patient and patient's husband, and all of patient's questions were answered to her expressed satisfaction.  -- Marilynne Drivers Rosana Hoes, MD, [place]: [facility] - Partnering for exceptional care. Office: [phone]

## 2018-07-13 NOTE — Telephone Encounter (Signed)
Called patient after receiving secure check messages from Dr. Rosana Hoes who recently evaluated patient for wound healing and Mediport placement.  Per Dr. Rosana Hoes, patient hesitates about whether to proceed with chemotherapy in the context of Covid-19 infection.  So I called patient and talk to her over the phone.  She will call office and update me about her decision of proceeding with chemotherapy or skipping chemotherapy and proceed with radiation.

## 2018-07-13 NOTE — Patient Instructions (Addendum)
Patient will need to return to the office as needed. Consult with Dr.Yu for further treatment.    Call the office with any questions or concerns.

## 2018-07-22 ENCOUNTER — Telehealth: Payer: Self-pay

## 2018-07-22 NOTE — Telephone Encounter (Signed)
Patient called and has decided to start with her Radiation therapy first. She does not want to have her port put in at this time due to the COVID virus. I let her know I would notify Dr Rosana Hoes of this.

## 2018-07-27 ENCOUNTER — Other Ambulatory Visit: Payer: Self-pay

## 2018-07-28 ENCOUNTER — Other Ambulatory Visit: Payer: Self-pay

## 2018-07-28 ENCOUNTER — Ambulatory Visit
Admission: RE | Admit: 2018-07-28 | Discharge: 2018-07-28 | Disposition: A | Discharge status: Home / Self Care | Payer: Medicare Other | Source: Ambulatory Visit | Attending: Radiation Oncology | Admitting: Radiation Oncology

## 2018-07-28 ENCOUNTER — Encounter: Payer: Self-pay | Admitting: Radiation Oncology

## 2018-07-28 VITALS — BP 165/75 | HR 67 | Temp 96.7°F | Resp 18 | Wt 154.4 lb

## 2018-07-28 DIAGNOSIS — C50312 Malignant neoplasm of lower-inner quadrant of left female breast: Secondary | ICD-10-CM | POA: Diagnosis present

## 2018-07-28 DIAGNOSIS — Z170 Estrogen receptor positive status [ER+]: Secondary | ICD-10-CM | POA: Diagnosis not present

## 2018-07-28 DIAGNOSIS — C50912 Malignant neoplasm of unspecified site of left female breast: Secondary | ICD-10-CM

## 2018-07-28 NOTE — Progress Notes (Signed)
Radiation Oncology Follow up Note  Name: Jackie Allison   Date:   07/28/2018 MRN:  [id number] DOB: [birth year]    This 78 y.o. 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  Patient developed left breast abscess after surgery which has completely resolved.  Her Oncotype DX showed high risk of recurrence although patient has declined at this time chemotherapy based on recent COVID-19 pan epidemic.  She wants to proceed now with whole breast radiation.  She specifically denies breast tenderness cough or bone pain.  COMPLICATIONS OF TREATMENT: none  FOLLOW UP COMPLIANCE: keeps appointments   PHYSICAL EXAM:  BP (!) 165/75 (BP Location: Right Arm, Patient Position: Sitting)   Pulse 67   Temp (!) 96.7 F (35.9 C) (Tympanic)   Resp 18   Wt 154 lb 6.9 oz (70.1 kg)   BMI 24.19 kg/m  Patient is status post wide local excision of the left breast and inferior portion of the breast with incision healing well.  No dominant mass or nodularity is noted in either breast in 2 positions examined.  No axillary or supraclavicular adenopathy is identified.  Well-developed well-nourished patient in NAD. HEENT reveals PERLA, EOMI, discs not visualized.  Oral cavity is clear. No oral mucosal lesions are identified. Neck is clear without evidence of cervical or supraclavicular adenopathy. Lungs are clear to A&P. Cardiac examination is essentially unremarkable with regular rate and rhythm without murmur rub or thrill. Abdomen is benign with no organomegaly or masses noted. Motor sensory and DTR  levels are equal and symmetric in the upper and lower extremities. Cranial nerves II through XII are grossly intact. Proprioception is intact. No peripheral adenopathy or edema is identified. No motor or sensory levels are noted. Crude visual fields are within normal range.  RADIOLOGY RESULTS: Prior mammograms ultrasound again reviewed  PLAN: December to go ahead with whole breast radiation to her left breast.  I believe I can treat in the hypofractionated course over 3 weeks.  Based on the close margin would boost her scar another 1600 cGy in 8 fractions.  Risks and benefits of treatment including skin reaction fatigue alteration of blood counts and possible occlusion of superficial lung all were discussed in detail with the patient.  I have personally set up and ordered CT simulation for later this week.  I would like to take this opportunity to thank you for allowing me to participate in the care of your patient.Noreene Filbert, MD

## 2018-07-29 ENCOUNTER — Ambulatory Visit
Admission: RE | Admit: 2018-07-29 | Discharge: 2018-07-29 | Disposition: A | Discharge status: Home / Self Care | Payer: Medicare Other | Source: Ambulatory Visit | Attending: Radiation Oncology | Admitting: Radiation Oncology

## 2018-07-29 ENCOUNTER — Other Ambulatory Visit: Payer: Self-pay

## 2018-07-29 DIAGNOSIS — Z170 Estrogen receptor positive status [ER+]: Secondary | ICD-10-CM | POA: Insufficient documentation

## 2018-07-29 DIAGNOSIS — C50912 Malignant neoplasm of unspecified site of left female breast: Secondary | ICD-10-CM | POA: Diagnosis not present

## 2018-07-29 DIAGNOSIS — Z510 Encounter for antineoplastic radiation therapy: Secondary | ICD-10-CM | POA: Insufficient documentation

## 2018-07-30 DIAGNOSIS — Z510 Encounter for antineoplastic radiation therapy: Secondary | ICD-10-CM | POA: Diagnosis not present

## 2018-08-04 ENCOUNTER — Other Ambulatory Visit: Payer: Self-pay

## 2018-08-05 ENCOUNTER — Other Ambulatory Visit: Payer: Self-pay

## 2018-08-05 ENCOUNTER — Ambulatory Visit
Admission: RE | Admit: 2018-08-05 | Discharge: 2018-08-05 | Disposition: A | Discharge status: Home / Self Care | Payer: Medicare Other | Source: Ambulatory Visit | Attending: Radiation Oncology | Admitting: Radiation Oncology

## 2018-08-05 DIAGNOSIS — Z510 Encounter for antineoplastic radiation therapy: Secondary | ICD-10-CM | POA: Diagnosis not present

## 2018-08-09 ENCOUNTER — Ambulatory Visit
Admission: RE | Admit: 2018-08-09 | Discharge: 2018-08-09 | Disposition: A | Discharge status: Home / Self Care | Payer: Medicare Other | Source: Ambulatory Visit | Attending: Radiation Oncology | Admitting: Radiation Oncology

## 2018-08-09 ENCOUNTER — Other Ambulatory Visit: Payer: Self-pay

## 2018-08-09 DIAGNOSIS — Z510 Encounter for antineoplastic radiation therapy: Secondary | ICD-10-CM | POA: Diagnosis not present

## 2018-08-10 ENCOUNTER — Ambulatory Visit
Admission: RE | Admit: 2018-08-10 | Discharge: 2018-08-10 | Disposition: A | Discharge status: Home / Self Care | Payer: Medicare Other | Source: Ambulatory Visit | Attending: Radiation Oncology | Admitting: Radiation Oncology

## 2018-08-10 ENCOUNTER — Other Ambulatory Visit: Payer: Self-pay | Admitting: Oncology

## 2018-08-10 ENCOUNTER — Other Ambulatory Visit: Payer: Self-pay | Admitting: *Deleted

## 2018-08-10 ENCOUNTER — Other Ambulatory Visit: Payer: Self-pay

## 2018-08-10 DIAGNOSIS — Z170 Estrogen receptor positive status [ER+]: Principal | ICD-10-CM

## 2018-08-10 DIAGNOSIS — C50912 Malignant neoplasm of unspecified site of left female breast: Secondary | ICD-10-CM

## 2018-08-10 DIAGNOSIS — Z510 Encounter for antineoplastic radiation therapy: Secondary | ICD-10-CM | POA: Diagnosis not present

## 2018-08-11 ENCOUNTER — Other Ambulatory Visit: Payer: Self-pay

## 2018-08-11 ENCOUNTER — Ambulatory Visit
Admission: RE | Admit: 2018-08-11 | Discharge: 2018-08-11 | Disposition: A | Discharge status: Home / Self Care | Payer: Medicare Other | Source: Ambulatory Visit | Attending: Radiation Oncology | Admitting: Radiation Oncology

## 2018-08-11 DIAGNOSIS — Z510 Encounter for antineoplastic radiation therapy: Secondary | ICD-10-CM | POA: Diagnosis not present

## 2018-08-12 ENCOUNTER — Ambulatory Visit
Admission: RE | Admit: 2018-08-12 | Discharge: 2018-08-12 | Disposition: A | Discharge status: Home / Self Care | Payer: Medicare Other | Source: Ambulatory Visit | Attending: Radiation Oncology | Admitting: Radiation Oncology

## 2018-08-12 ENCOUNTER — Other Ambulatory Visit: Payer: Self-pay

## 2018-08-12 DIAGNOSIS — Z510 Encounter for antineoplastic radiation therapy: Secondary | ICD-10-CM | POA: Diagnosis not present

## 2018-08-13 ENCOUNTER — Other Ambulatory Visit: Payer: Self-pay

## 2018-08-13 ENCOUNTER — Ambulatory Visit
Admission: RE | Admit: 2018-08-13 | Discharge: 2018-08-13 | Disposition: A | Discharge status: Home / Self Care | Payer: Medicare Other | Source: Ambulatory Visit | Attending: Radiation Oncology | Admitting: Radiation Oncology

## 2018-08-13 DIAGNOSIS — Z510 Encounter for antineoplastic radiation therapy: Secondary | ICD-10-CM | POA: Diagnosis not present

## 2018-08-16 ENCOUNTER — Ambulatory Visit
Admission: RE | Admit: 2018-08-16 | Discharge: 2018-08-16 | Disposition: A | Discharge status: Home / Self Care | Payer: Medicare Other | Source: Ambulatory Visit | Attending: Radiation Oncology | Admitting: Radiation Oncology

## 2018-08-16 ENCOUNTER — Other Ambulatory Visit: Payer: Self-pay

## 2018-08-16 DIAGNOSIS — Z510 Encounter for antineoplastic radiation therapy: Secondary | ICD-10-CM | POA: Diagnosis not present

## 2018-08-17 ENCOUNTER — Ambulatory Visit
Admission: RE | Admit: 2018-08-17 | Discharge: 2018-08-17 | Disposition: A | Discharge status: Home / Self Care | Payer: Medicare Other | Source: Ambulatory Visit | Attending: Radiation Oncology | Admitting: Radiation Oncology

## 2018-08-17 ENCOUNTER — Other Ambulatory Visit: Payer: Self-pay

## 2018-08-17 DIAGNOSIS — Z510 Encounter for antineoplastic radiation therapy: Secondary | ICD-10-CM | POA: Diagnosis not present

## 2018-08-18 ENCOUNTER — Other Ambulatory Visit: Payer: Self-pay

## 2018-08-18 ENCOUNTER — Ambulatory Visit
Admission: RE | Admit: 2018-08-18 | Discharge: 2018-08-18 | Disposition: A | Discharge status: Home / Self Care | Payer: Medicare Other | Source: Ambulatory Visit | Attending: Radiation Oncology | Admitting: Radiation Oncology

## 2018-08-18 DIAGNOSIS — Z510 Encounter for antineoplastic radiation therapy: Secondary | ICD-10-CM | POA: Diagnosis not present

## 2018-08-19 ENCOUNTER — Ambulatory Visit
Admission: RE | Admit: 2018-08-19 | Discharge: 2018-08-19 | Disposition: A | Discharge status: Home / Self Care | Payer: Medicare Other | Source: Ambulatory Visit | Attending: Radiation Oncology | Admitting: Radiation Oncology

## 2018-08-19 ENCOUNTER — Other Ambulatory Visit: Payer: Self-pay

## 2018-08-19 DIAGNOSIS — Z510 Encounter for antineoplastic radiation therapy: Secondary | ICD-10-CM | POA: Diagnosis not present

## 2018-08-20 ENCOUNTER — Other Ambulatory Visit: Payer: Self-pay

## 2018-08-20 ENCOUNTER — Ambulatory Visit
Admission: RE | Admit: 2018-08-20 | Discharge: 2018-08-20 | Disposition: A | Discharge status: Home / Self Care | Payer: Medicare Other | Source: Ambulatory Visit | Attending: Radiation Oncology | Admitting: Radiation Oncology

## 2018-08-20 DIAGNOSIS — Z510 Encounter for antineoplastic radiation therapy: Secondary | ICD-10-CM | POA: Diagnosis not present

## 2018-08-23 ENCOUNTER — Other Ambulatory Visit: Payer: Self-pay

## 2018-08-23 ENCOUNTER — Inpatient Hospital Stay: Payer: Medicare Other | Attending: Radiation Oncology

## 2018-08-23 ENCOUNTER — Ambulatory Visit
Admission: RE | Admit: 2018-08-23 | Discharge: 2018-08-23 | Disposition: A | Discharge status: Home / Self Care | Payer: Medicare Other | Source: Ambulatory Visit | Attending: Radiation Oncology | Admitting: Radiation Oncology

## 2018-08-23 DIAGNOSIS — Z510 Encounter for antineoplastic radiation therapy: Secondary | ICD-10-CM | POA: Diagnosis not present

## 2018-08-23 DIAGNOSIS — Z170 Estrogen receptor positive status [ER+]: Secondary | ICD-10-CM | POA: Diagnosis not present

## 2018-08-23 DIAGNOSIS — C50312 Malignant neoplasm of lower-inner quadrant of left female breast: Secondary | ICD-10-CM | POA: Insufficient documentation

## 2018-08-23 DIAGNOSIS — Z923 Personal history of irradiation: Secondary | ICD-10-CM | POA: Diagnosis not present

## 2018-08-23 DIAGNOSIS — C50912 Malignant neoplasm of unspecified site of left female breast: Secondary | ICD-10-CM

## 2018-08-23 LAB — CBC
HCT: 32.4 % — ABNORMAL LOW (ref 36.0–46.0)
Hemoglobin: 10.6 g/dL — ABNORMAL LOW (ref 12.0–15.0)
MCH: 24.4 pg — ABNORMAL LOW (ref 26.0–34.0)
MCHC: 32.7 g/dL (ref 30.0–36.0)
MCV: 74.5 fL — ABNORMAL LOW (ref 80.0–100.0)
Platelets: 252 10*3/uL (ref 150–400)
RBC: 4.35 MIL/uL (ref 3.87–5.11)
RDW: 16.4 % — ABNORMAL HIGH (ref 11.5–15.5)
WBC: 4.2 10*3/uL (ref 4.0–10.5)
nRBC: 0 % (ref 0.0–0.2)

## 2018-08-24 ENCOUNTER — Ambulatory Visit
Admission: RE | Admit: 2018-08-24 | Discharge: 2018-08-24 | Disposition: A | Discharge status: Home / Self Care | Payer: Medicare Other | Source: Ambulatory Visit | Attending: Radiation Oncology | Admitting: Radiation Oncology

## 2018-08-24 ENCOUNTER — Other Ambulatory Visit: Payer: Self-pay

## 2018-08-24 DIAGNOSIS — Z510 Encounter for antineoplastic radiation therapy: Secondary | ICD-10-CM | POA: Diagnosis not present

## 2018-08-25 ENCOUNTER — Other Ambulatory Visit: Payer: Self-pay

## 2018-08-25 ENCOUNTER — Ambulatory Visit
Admission: RE | Admit: 2018-08-25 | Discharge: 2018-08-25 | Disposition: A | Discharge status: Home / Self Care | Payer: Medicare Other | Source: Ambulatory Visit | Attending: Radiation Oncology | Admitting: Radiation Oncology

## 2018-08-25 DIAGNOSIS — Z510 Encounter for antineoplastic radiation therapy: Secondary | ICD-10-CM | POA: Diagnosis not present

## 2018-08-26 ENCOUNTER — Other Ambulatory Visit: Payer: Self-pay

## 2018-08-26 ENCOUNTER — Ambulatory Visit
Admission: RE | Admit: 2018-08-26 | Discharge: 2018-08-26 | Disposition: A | Discharge status: Home / Self Care | Payer: Medicare Other | Source: Ambulatory Visit | Attending: Radiation Oncology | Admitting: Radiation Oncology

## 2018-08-26 DIAGNOSIS — Z510 Encounter for antineoplastic radiation therapy: Secondary | ICD-10-CM | POA: Diagnosis not present

## 2018-08-27 ENCOUNTER — Ambulatory Visit
Admission: RE | Admit: 2018-08-27 | Discharge: 2018-08-27 | Disposition: A | Discharge status: Home / Self Care | Payer: Medicare Other | Source: Ambulatory Visit | Attending: Radiation Oncology | Admitting: Radiation Oncology

## 2018-08-27 ENCOUNTER — Other Ambulatory Visit: Payer: Self-pay

## 2018-08-27 DIAGNOSIS — Z510 Encounter for antineoplastic radiation therapy: Secondary | ICD-10-CM | POA: Diagnosis present

## 2018-08-27 DIAGNOSIS — Z170 Estrogen receptor positive status [ER+]: Secondary | ICD-10-CM | POA: Diagnosis not present

## 2018-08-27 DIAGNOSIS — C50912 Malignant neoplasm of unspecified site of left female breast: Secondary | ICD-10-CM | POA: Diagnosis not present

## 2018-08-30 ENCOUNTER — Other Ambulatory Visit: Payer: Self-pay

## 2018-08-30 ENCOUNTER — Ambulatory Visit
Admission: RE | Admit: 2018-08-30 | Discharge: 2018-08-30 | Disposition: A | Discharge status: Home / Self Care | Payer: Medicare Other | Source: Ambulatory Visit | Attending: Radiation Oncology | Admitting: Radiation Oncology

## 2018-08-30 DIAGNOSIS — Z510 Encounter for antineoplastic radiation therapy: Secondary | ICD-10-CM | POA: Diagnosis not present

## 2018-08-31 ENCOUNTER — Ambulatory Visit
Admission: RE | Admit: 2018-08-31 | Discharge: 2018-08-31 | Disposition: A | Discharge status: Home / Self Care | Payer: Medicare Other | Source: Ambulatory Visit | Attending: Radiation Oncology | Admitting: Radiation Oncology

## 2018-08-31 ENCOUNTER — Other Ambulatory Visit: Payer: Self-pay

## 2018-08-31 DIAGNOSIS — Z510 Encounter for antineoplastic radiation therapy: Secondary | ICD-10-CM | POA: Diagnosis not present

## 2018-09-01 ENCOUNTER — Ambulatory Visit
Admission: RE | Admit: 2018-09-01 | Discharge: 2018-09-01 | Disposition: A | Discharge status: Home / Self Care | Payer: Medicare Other | Source: Ambulatory Visit | Attending: Radiation Oncology | Admitting: Radiation Oncology

## 2018-09-01 ENCOUNTER — Other Ambulatory Visit: Payer: Self-pay

## 2018-09-01 DIAGNOSIS — Z510 Encounter for antineoplastic radiation therapy: Secondary | ICD-10-CM | POA: Diagnosis not present

## 2018-09-02 ENCOUNTER — Ambulatory Visit
Admission: RE | Admit: 2018-09-02 | Discharge: 2018-09-02 | Disposition: A | Discharge status: Home / Self Care | Payer: Medicare Other | Source: Ambulatory Visit | Attending: Radiation Oncology | Admitting: Radiation Oncology

## 2018-09-02 ENCOUNTER — Other Ambulatory Visit: Payer: Self-pay

## 2018-09-02 DIAGNOSIS — Z510 Encounter for antineoplastic radiation therapy: Secondary | ICD-10-CM | POA: Diagnosis not present

## 2018-09-03 ENCOUNTER — Ambulatory Visit
Admission: RE | Admit: 2018-09-03 | Discharge: 2018-09-03 | Disposition: A | Discharge status: Home / Self Care | Payer: Medicare Other | Source: Ambulatory Visit | Attending: Radiation Oncology | Admitting: Radiation Oncology

## 2018-09-03 ENCOUNTER — Other Ambulatory Visit: Payer: Self-pay

## 2018-09-03 DIAGNOSIS — Z510 Encounter for antineoplastic radiation therapy: Secondary | ICD-10-CM | POA: Diagnosis not present

## 2018-09-06 ENCOUNTER — Other Ambulatory Visit: Payer: Self-pay

## 2018-09-06 ENCOUNTER — Inpatient Hospital Stay: Payer: Medicare Other | Attending: Radiation Oncology

## 2018-09-06 ENCOUNTER — Ambulatory Visit: Admission: RE | Admit: 2018-09-06 | Payer: Medicare Other | Source: Ambulatory Visit

## 2018-09-06 ENCOUNTER — Ambulatory Visit: Payer: Medicare Other

## 2018-09-06 ENCOUNTER — Other Ambulatory Visit: Payer: Self-pay | Admitting: *Deleted

## 2018-09-06 DIAGNOSIS — C50312 Malignant neoplasm of lower-inner quadrant of left female breast: Secondary | ICD-10-CM | POA: Insufficient documentation

## 2018-09-06 DIAGNOSIS — D508 Other iron deficiency anemias: Secondary | ICD-10-CM | POA: Diagnosis not present

## 2018-09-06 DIAGNOSIS — Z923 Personal history of irradiation: Secondary | ICD-10-CM | POA: Insufficient documentation

## 2018-09-06 DIAGNOSIS — Z170 Estrogen receptor positive status [ER+]: Secondary | ICD-10-CM | POA: Insufficient documentation

## 2018-09-06 DIAGNOSIS — C50912 Malignant neoplasm of unspecified site of left female breast: Secondary | ICD-10-CM

## 2018-09-06 LAB — CBC
HCT: 31.8 % — ABNORMAL LOW (ref 36.0–46.0)
Hemoglobin: 10.3 g/dL — ABNORMAL LOW (ref 12.0–15.0)
MCH: 24.3 pg — ABNORMAL LOW (ref 26.0–34.0)
MCHC: 32.4 g/dL (ref 30.0–36.0)
MCV: 75 fL — ABNORMAL LOW (ref 80.0–100.0)
Platelets: 244 10*3/uL (ref 150–400)
RBC: 4.24 MIL/uL (ref 3.87–5.11)
RDW: 16.1 % — ABNORMAL HIGH (ref 11.5–15.5)
WBC: 4.1 10*3/uL (ref 4.0–10.5)
nRBC: 0 % (ref 0.0–0.2)

## 2018-09-06 MED ORDER — SILVER SULFADIAZINE 1 % EX CREA: 1.0000 "application " | TOPICAL_CREAM | Freq: Two times a day (BID) | CUTANEOUS | 0 refills | Status: DC

## 2018-09-07 ENCOUNTER — Ambulatory Visit: Payer: Medicare Other

## 2018-09-08 ENCOUNTER — Ambulatory Visit: Payer: Medicare Other

## 2018-09-09 ENCOUNTER — Ambulatory Visit: Payer: Medicare Other

## 2018-09-10 ENCOUNTER — Ambulatory Visit: Payer: Medicare Other

## 2018-09-13 ENCOUNTER — Ambulatory Visit: Payer: Medicare Other

## 2018-09-13 ENCOUNTER — Other Ambulatory Visit: Payer: Self-pay

## 2018-09-13 ENCOUNTER — Ambulatory Visit: Admission: RE | Admit: 2018-09-13 | Payer: Medicare Other | Source: Ambulatory Visit

## 2018-09-14 ENCOUNTER — Ambulatory Visit: Payer: Medicare Other

## 2018-09-15 ENCOUNTER — Ambulatory Visit: Payer: Medicare Other

## 2018-09-16 ENCOUNTER — Ambulatory Visit: Payer: Medicare Other

## 2018-09-16 ENCOUNTER — Ambulatory Visit: Admission: RE | Admit: 2018-09-16 | Payer: Medicare Other | Source: Ambulatory Visit

## 2018-09-17 ENCOUNTER — Ambulatory Visit: Payer: Medicare Other

## 2018-09-21 ENCOUNTER — Ambulatory Visit: Admission: RE | Admit: 2018-09-21 | Payer: Medicare Other | Source: Ambulatory Visit

## 2018-09-21 ENCOUNTER — Other Ambulatory Visit: Payer: Self-pay

## 2018-09-21 ENCOUNTER — Ambulatory Visit: Payer: Medicare Other

## 2018-09-22 ENCOUNTER — Ambulatory Visit
Admission: RE | Admit: 2018-09-22 | Discharge: 2018-09-22 | Disposition: A | Discharge status: Home / Self Care | Payer: Medicare Other | Source: Ambulatory Visit | Attending: Radiation Oncology | Admitting: Radiation Oncology

## 2018-09-22 ENCOUNTER — Ambulatory Visit: Payer: Medicare Other

## 2018-09-22 ENCOUNTER — Other Ambulatory Visit: Payer: Self-pay

## 2018-09-22 DIAGNOSIS — Z510 Encounter for antineoplastic radiation therapy: Secondary | ICD-10-CM | POA: Diagnosis not present

## 2018-09-23 ENCOUNTER — Other Ambulatory Visit: Payer: Self-pay

## 2018-09-23 ENCOUNTER — Ambulatory Visit
Admission: RE | Admit: 2018-09-23 | Discharge: 2018-09-23 | Disposition: A | Discharge status: Home / Self Care | Payer: Medicare Other | Source: Ambulatory Visit | Attending: Radiation Oncology | Admitting: Radiation Oncology

## 2018-09-23 DIAGNOSIS — Z510 Encounter for antineoplastic radiation therapy: Secondary | ICD-10-CM | POA: Diagnosis not present

## 2018-09-24 ENCOUNTER — Ambulatory Visit
Admission: RE | Admit: 2018-09-24 | Discharge: 2018-09-24 | Disposition: A | Discharge status: Home / Self Care | Payer: Medicare Other | Source: Ambulatory Visit | Attending: Radiation Oncology | Admitting: Radiation Oncology

## 2018-09-24 ENCOUNTER — Ambulatory Visit: Payer: Medicare Other

## 2018-09-24 ENCOUNTER — Other Ambulatory Visit: Payer: Self-pay

## 2018-09-24 DIAGNOSIS — Z510 Encounter for antineoplastic radiation therapy: Secondary | ICD-10-CM | POA: Diagnosis not present

## 2018-09-27 ENCOUNTER — Ambulatory Visit
Admission: RE | Admit: 2018-09-27 | Discharge: 2018-09-27 | Disposition: A | Discharge status: Home / Self Care | Payer: Medicare Other | Source: Ambulatory Visit | Attending: Radiation Oncology | Admitting: Radiation Oncology

## 2018-09-27 ENCOUNTER — Other Ambulatory Visit: Payer: Self-pay

## 2018-09-27 DIAGNOSIS — Z170 Estrogen receptor positive status [ER+]: Secondary | ICD-10-CM | POA: Insufficient documentation

## 2018-09-27 DIAGNOSIS — C50912 Malignant neoplasm of unspecified site of left female breast: Secondary | ICD-10-CM | POA: Insufficient documentation

## 2018-09-27 DIAGNOSIS — Z510 Encounter for antineoplastic radiation therapy: Secondary | ICD-10-CM | POA: Diagnosis present

## 2018-10-05 ENCOUNTER — Other Ambulatory Visit: Payer: Self-pay

## 2018-10-05 ENCOUNTER — Encounter: Payer: Self-pay | Admitting: Oncology

## 2018-10-05 ENCOUNTER — Inpatient Hospital Stay: Payer: Medicare Other | Attending: Oncology | Admitting: Oncology

## 2018-10-05 VITALS — BP 165/75 | HR 71 | Temp 98.0°F | Resp 18 | Wt 153.8 lb

## 2018-10-05 DIAGNOSIS — D509 Iron deficiency anemia, unspecified: Secondary | ICD-10-CM | POA: Insufficient documentation

## 2018-10-05 DIAGNOSIS — Z7984 Long term (current) use of oral hypoglycemic drugs: Secondary | ICD-10-CM

## 2018-10-05 DIAGNOSIS — E7800 Pure hypercholesterolemia, unspecified: Secondary | ICD-10-CM | POA: Diagnosis not present

## 2018-10-05 DIAGNOSIS — Z791 Long term (current) use of non-steroidal anti-inflammatories (NSAID): Secondary | ICD-10-CM | POA: Diagnosis not present

## 2018-10-05 DIAGNOSIS — Z79811 Long term (current) use of aromatase inhibitors: Secondary | ICD-10-CM | POA: Diagnosis not present

## 2018-10-05 DIAGNOSIS — C50312 Malignant neoplasm of lower-inner quadrant of left female breast: Secondary | ICD-10-CM | POA: Diagnosis present

## 2018-10-05 DIAGNOSIS — E871 Hypo-osmolality and hyponatremia: Secondary | ICD-10-CM | POA: Insufficient documentation

## 2018-10-05 DIAGNOSIS — E119 Type 2 diabetes mellitus without complications: Secondary | ICD-10-CM

## 2018-10-05 DIAGNOSIS — L589 Radiodermatitis, unspecified: Secondary | ICD-10-CM

## 2018-10-05 DIAGNOSIS — Z170 Estrogen receptor positive status [ER+]: Secondary | ICD-10-CM

## 2018-10-05 DIAGNOSIS — M8580 Other specified disorders of bone density and structure, unspecified site: Secondary | ICD-10-CM | POA: Diagnosis not present

## 2018-10-05 DIAGNOSIS — Z853 Personal history of malignant neoplasm of breast: Secondary | ICD-10-CM

## 2018-10-05 DIAGNOSIS — Z79899 Other long term (current) drug therapy: Secondary | ICD-10-CM | POA: Insufficient documentation

## 2018-10-05 DIAGNOSIS — C50919 Malignant neoplasm of unspecified site of unspecified female breast: Secondary | ICD-10-CM

## 2018-10-05 DIAGNOSIS — Z7982 Long term (current) use of aspirin: Secondary | ICD-10-CM

## 2018-10-05 DIAGNOSIS — I10 Essential (primary) hypertension: Secondary | ICD-10-CM | POA: Diagnosis not present

## 2018-10-05 MED ORDER — FERROUS SULFATE 325 (65 FE) MG PO TBEC: 325.0000 mg | DELAYED_RELEASE_TABLET | Freq: Two times a day (BID) | ORAL | 1 refills | Status: DC

## 2018-10-05 MED ORDER — ANASTROZOLE 1 MG PO TABS: 1.0000 mg | ORAL_TABLET | Freq: Every day | ORAL | 1 refills | Status: DC

## 2018-10-05 NOTE — Progress Notes (Signed)
Hematology/Oncology follow up note [facility] [phone] [phone]   Patient Care Team: Dion Body, MD as PCP - General (Family Medicine)  CHIEF COMPLAINTS/REASON FOR VISIT:  Follow up for management  of breast cancer  HISTORY OF PRESENTING ILLNESS:  Jackie Allison is a  78 y.o.  female with PMH listed below who was referred to me for evaluation of breast cancer Self palpated left breast mass for 1 month.  Patient had mammogram and ultrasound on 03/11/2018 which showed suspicious left breast mass, indeterminate left aixllary lymph node.  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  Nipple discharge: denies.  Family history: great niece had breast cancer OCP use: denies.  Estrogen and progesterone therapy: denies History of radiation to chest: denies.  Previous breast surgery: previous right breast biopsy.   #  04/13/2018 underwent left lumpectomy and sentinel lymph node biopsy on Pathology showed invasive mammary carcinoma, negative margin, sentinel lymph nodes negative 0/5,  Grade 2, ER positive, PR negative HER-2 negative[performed on previous biopsy].Lymphovascular invasion: Not identified  Patient developed left breast abscess and infection status post I&D and debridement on 04/28/2018 # 06/19/2018 wound infection. Surgical wound culture positive for MRSA, as well as enterococcus faecalis. Patient was discharged on 06/22/2018.  # Declined chemotherapy.  # finished adjuvant radiation-last RT 09/27/2018  INTERVAL HISTORY Jackie Allison is a 78 y.o. female who has above history reviewed by me today presents for follow up visit for management of stage IIA breast cancer. Patient finished adjuvant radiation on 09/27/2018. Reports local burning sensation and skin discoloration.  She uses silverdene cream as instructed by radonc.   Review of Systems  Constitutional:  Positive for fatigue. Negative for appetite change, chills and fever.  HENT:   Negative for hearing loss and voice change.   Eyes: Negative for eye problems.  Respiratory: Negative for chest tightness and cough.   Cardiovascular: Negative for chest pain and leg swelling.  Gastrointestinal: Negative for abdominal distention, abdominal pain and blood in stool.  Endocrine: Negative for hot flashes.  Genitourinary: Negative for difficulty urinating and frequency.   Musculoskeletal: Negative for arthralgias.       Chronic back pain  Skin: Negative for itching and rash.       Burning sensation at the site of radiation.   Neurological: Negative for extremity weakness.  Hematological: Negative for adenopathy.  Psychiatric/Behavioral: Negative for confusion.    MEDICAL HISTORY:  Past Medical History:  Diagnosis Date  . Anemia    chronic microcytic anemia  . Breast abscess 06/20/2018  . Cancer ([place]) 2019   left breast  . Diabetes mellitus without complication ([place])   . GERD (gastroesophageal reflux disease)   . Glaucoma   . Hypercholesterolemia   . Hypertension     SURGICAL HISTORY: Past Surgical History:  Procedure Laterality Date  . BACK SURGERY    . BREAST BIOPSY Right    stereo- neg  . BREAST BIOPSY Left 02/2018  . BREAST EXCISIONAL BIOPSY Left 04/03/2018   left breast cancer lumpectomy   . BREAST LUMPECTOMY Left 04/03/2018  . CATARACT EXTRACTION W/ INTRAOCULAR LENS  IMPLANT, BILATERAL    . EYE SURGERY Bilateral    cataract extraction  . INCISION AND DRAINAGE ABSCESS Left 04/28/2018   Procedure: INCISION AND DRAINAGE ABSCESS;  Surgeon: Benjamine Sprague, DO;  Location: [facility] ORS;  Service: General;  Laterality: Left;  . INCISION AND DRAINAGE ABSCESS Left 06/20/2018   Procedure: INCISION  AND DRAINAGE ABSCESS;  Surgeon: Herbert Pun, MD;  Location: [facility] ORS;  Service: General;  Laterality: Left;  . LUMBAR FUSION  2002?  . LUMBAR LAMINECTOMY  1997   titanium in back  .  PARTIAL MASTECTOMY WITH AXILLARY SENTINEL LYMPH NODE BIOPSY Left 04/13/2018   Procedure: PARTIAL MASTECTOMY WITH AXILLARY SENTINEL LYMPH NODE BIOPSY  (No Needle Loc);  Surgeon: Benjamine Sprague, DO;  Location: [facility] ORS;  Service: General;  Laterality: Left;  . RETINAL DETACHMENT SURGERY Bilateral 2009  . TUBAL LIGATION      SOCIAL HISTORY: Social History   Socioeconomic History  . Marital status: Married    Spouse name: [name]  . Number of children: Not on file  . Years of education: Not on file  . Highest education level: Not on file  Occupational History  . Occupation: Restaurant manager, fast food estate  Social Needs  . Financial resource strain: Not on file  . Food insecurity:    Worry: Not on file    Inability: Not on file  . Transportation needs:    Medical: Not on file    Non-medical: Not on file  Tobacco Use  . Smoking status: Never Smoker  . Smokeless tobacco: Never Used  Substance and Sexual Activity  . Alcohol use: Never    Frequency: Never  . Drug use: Never  . Sexual activity: Not on file  Lifestyle  . Physical activity:    Days per week: Not on file    Minutes per session: Not on file  . Stress: Not on file  Relationships  . Social connections:    Talks on phone: Not on file    Gets together: Not on file    Attends religious service: Not on file    Active member of club or organization: Not on file    Attends meetings of clubs or organizations: Not on file    Relationship status: Not on file  . Intimate partner violence:    Fear of current or ex partner: Not on file    Emotionally abused: Not on file    Physically abused: Not on file    Forced sexual activity: Not on file  Other Topics Concern  . Not on file  Social History Narrative  . Not on file    FAMILY HISTORY: Family History  Problem Relation Age of Onset  . Diabetes Sister   . Diabetes Brother   . Diabetes Sister   . Breast cancer Other   . Hypertension Mother   . Glaucoma Mother   . Heart attack  Father     ALLERGIES:  is allergic to shellfish allergy; iodine; and sulfa antibiotics.  MEDICATIONS:  Current Outpatient Medications  Medication Sig Dispense Refill  . acetaminophen (TYLENOL) 500 MG tablet Take 500 mg by mouth every 6 (six) hours as needed for moderate pain.     Marland Kitchen aspirin 81 MG EC tablet Take 81 mg by mouth daily.     Marland Kitchen atorvastatin (LIPITOR) 10 MG tablet Take 10 mg by mouth every evening.     . brimonidine (ALPHAGAN) 0.2 % ophthalmic solution Place 1 drop into the right eye 2 (two) times daily.     . carvedilol (COREG) 12.5 MG tablet Take 12.5 mg by mouth 2 (two) times daily with a meal.     . chlorhexidine (PERIDEX) 0.12 % solution Use as directed 15 mLs in the mouth or throat 2 (two) times daily.   99  . Coenzyme Q10 (COQ10 PO) Take 1 tablet by mouth  daily.    . dorzolamide-timolol (COSOPT) 22.3-6.8 MG/ML ophthalmic solution Place 1 drop into both eyes 2 (two) times daily.    . ferrous sulfate 325 (65 FE) MG EC tablet TAKE 1 TABLET (325 MG TOTAL) BY MOUTH 2 (TWO) TIMES DAILY WITH A MEAL. 60 tablet 1  . hydrALAZINE (APRESOLINE) 100 MG tablet Take 100 mg by mouth 3 (three) times daily.   3  . ibuprofen (ADVIL,MOTRIN) 800 MG tablet Take 1 tablet (800 mg total) by mouth every 8 (eight) hours as needed for mild pain or moderate pain. 30 tablet 0  . latanoprost (XALATAN) 0.005 % ophthalmic solution Place 1 drop into both eyes at bedtime.     . metFORMIN (GLUCOPHAGE) 500 MG tablet Take 500 mg by mouth daily with breakfast.     . Multiple Vitamin (MULTI-VITAMINS) TABS Take 1 tablet by mouth daily.     Marland Kitchen omeprazole (PRILOSEC) 40 MG capsule Take 40 mg by mouth daily.     . silver sulfADIAZINE (SILVADENE) 1 % cream Apply 1 application topically 2 (two) times daily. 50 g 0  . Specialty Vitamins Products (MAGNESIUM, AMINO ACID CHELATE,) 133 MG tablet Take 1 tablet by mouth daily.    Marland Kitchen trolamine salicylate (ASPERCREME) 10 % cream Apply 1 application topically as needed for muscle  pain.    . benazepril (LOTENSIN) 40 MG tablet Take 40 mg by mouth daily.     Marland Kitchen dexamethasone (DECADRON) 4 MG tablet Take 2 tablets (8 mg total) by mouth 2 (two) times daily. Start the day before Taxotere. Then again the day after chemo for 3 days. (Patient not taking: Reported on 07/28/2018) 30 tablet 1  . docusate sodium (COLACE) 100 MG capsule Take 100 mg by mouth daily as needed for mild constipation.    Marland Kitchen doxycycline (VIBRA-TABS) 100 MG tablet Take 100 mg by mouth 2 (two) times daily.    Marland Kitchen lidocaine-prilocaine (EMLA) cream Apply to affected area once (Patient not taking: Reported on 10/05/2018) 30 g 3  . prochlorperazine (COMPAZINE) 10 MG tablet Take 1 tablet (10 mg total) by mouth every 6 (six) hours as needed (Nausea or vomiting). (Patient not taking: Reported on 10/05/2018) 30 tablet 1   No current facility-administered medications for this visit.      PHYSICAL EXAMINATION: ECOG PERFORMANCE STATUS: 1 - Symptomatic but completely ambulatory Vitals:   10/05/18 1457  BP: (!) 165/75  Pulse: 71  Resp: 18  Temp: 98 F (36.7 C)   Filed Weights   10/05/18 1457  Weight: 153 lb 12.8 oz (69.8 kg)    Physical Exam Constitutional:      General: She is not in acute distress. HENT:     Head: Normocephalic and atraumatic.  Eyes:     General: No scleral icterus.    Pupils: Pupils are equal, round, and reactive to light.  Neck:     Musculoskeletal: Normal range of motion and neck supple.  Cardiovascular:     Rate and Rhythm: Normal rate and regular rhythm.     Heart sounds: Normal heart sounds.  Pulmonary:     Effort: Pulmonary effort is normal. No respiratory distress.     Breath sounds: No wheezing.  Abdominal:     General: Bowel sounds are normal. There is no distension.     Palpations: Abdomen is soft. There is no mass.     Tenderness: There is no abdominal tenderness.  Musculoskeletal: Normal range of motion.        General: No deformity.  Skin:    General: Skin is warm and dry.      Findings: No erythema or rash.  Neurological:     Mental Status: She is alert and oriented to person, place, and time.     Cranial Nerves: No cranial nerve deficit.     Coordination: Coordination normal.  Psychiatric:        Behavior: Behavior normal.        Thought Content: Thought content normal.   left breast s/p radiation, skin hyperpigmentation.    LABORATORY DATA:  I have reviewed the data as listed Lab Results  Component Value Date   WBC 4.1 09/06/2018   HGB 10.3 (L) 09/06/2018   HCT 31.8 (L) 09/06/2018   MCV 75.0 (L) 09/06/2018   PLT 244 09/06/2018   Recent Labs    06/20/18 0009 06/20/18 1105 06/21/18 0540 06/28/18 0826  NA 124* 126* 128* 129*  K 3.3*  --  3.6 3.4*  CL 94*  --  98 98  CO2 21*  --  19* 21*  GLUCOSE 121*  --  208* 119*  BUN 9  --  14 14  CREATININE 0.57  --  0.75 0.82  CALCIUM 8.6*  --  8.6* 9.3  GFRNONAA >60  --  >60 >60  GFRAA >60  --  >60 >60  PROT 6.4*  --  6.4* 7.3  ALBUMIN 3.0*  --  2.7* 3.5  AST 13*  --  16 13*  ALT 13  --  16 15  ALKPHOS 60  --  58 53  BILITOT 0.5  --  0.5 0.6   Iron/TIBC/Ferritin/ %Sat    Component Value Date/Time   IRON 51 06/02/2018 1120   TIBC 332 06/02/2018 1120   FERRITIN 15 06/02/2018 1120   IRONPCTSAT 15 06/02/2018 1120     RADIOGRAPHIC STUDIES: I have personally reviewed the radiological images as listed and agreed with the findings in the report. 03/11/2018 Diagnostic Mammogram and [place]. A radiopaque BB was placed at the site of the patient's palpable lump in the lower inner left breast at far posterior depth. A spiculated hyperdense mass is seen deep to the radiopaque BB along the chest wall. No additional suspicious findings are identified within either breast. Further evaluation with ultrasound was performed. Mammographic images were processed with CAD.  On physical exam, I palpate a firm, fixed 2-3 cm mass along the inframammary fold of the 7 o'clock position. Targeted ultrasound is performed,  showing an irregular hypoechoic mass with posterior acoustic shadowing at the 7:30 position 11 cm from the nipple. It measures 2.7 x 2.5 x 2.0 cm. There is associated vascularity. This correlates well with the mammographic finding. Evaluation of the axilla demonstrates a single axillary lymph node with borderline cortical thickening of 0.4 cm. IMPRESSION: 1. Highly suspicious left breast mass corresponding with the patient's palpable lump. Recommendation is for ultrasound-guided biopsy. 2. Indeterminate left axillary lymph node. Recommendation is for ultrasound-guided biopsy.  Same as pre-op    ASSESSMENT & PLAN:  1. Malignant neoplasm of breast in female, estrogen receptor positive, unspecified laterality, unspecified site of breast ([place])   2. History of breast cancer   3. Malignant neoplasm of lower-inner quadrant of left breast in female, estrogen receptor positive ([facility])   4. Radiation dermatitis   5. Microcytic anemia   6. Osteopenia, unspecified location   Cancer Staging Breast cancer in female [facility]) Staging form: Breast, AJCC 8th Edition - Clinical: No stage assigned - Unsigned - Pathologic stage from 06/11/2018:  Stage IIA (pT2, pN0, cM0, G2, ER+, PR-, HER2-, Oncotype DX score: 27) - Signed by Earlie Server, MD on 06/11/2018  # pT2 pN0 ER positive invasive mammary carcinoma. Oncotype DX recurrence score came back at 27, predicting distant recurrence risk at 9 years with tamoxifen alone around 16%.  Group average absolute chemotherapy benefit >15%.  Patient declined chemotherapy. Finished adjuvant Radiation.   Rationale of using aromatase inhibitor -Arimidex  discussed with patient.  Side effects of Arimidex including but not limited to hot flush, joint pain, fatigue, mood swing, osteoporosis discussed with patient. Patient voices understanding and willing to proceed.   # Osteopenia, continue calcium and vitamin D supplements. Obtian DEXA.  Recommend bisphosphate treatment with Zometa,  or prilia. Recommend obtaining dental clearance.   # Microcytic anemia /iron deficiency, continue oral iron supplementation. Future GI referral.  # Chronic hyponatremia. Sodium 129, 09/22/2018 [place] labs.  Stable.    Orders Placed This Encounter  Procedures  . DG Bone Density    Standing Status:   Future    Standing Expiration Date:   10/05/2019    Order Specific Question:   Reason for Exam (SYMPTOM  OR DIAGNOSIS REQUIRED)    Answer:   history of breast cancer    Order Specific Question:   Preferred imaging location?    Answer:   [facility]    Return of visit: 6 weeks for evaluation of Arimidex tolerability.   We spent sufficient time to discuss many aspect of care, questions were answered to patient's satisfaction. Total face to face encounter time for this patient visit was 25 min. >50% of the time was  spent in counseling and coordination of care.    Earlie Server, MD, PhD  10/05/2018

## 2018-10-05 NOTE — Progress Notes (Signed)
Patient here for follow up. Pt has radiation burns to left breast.

## 2018-10-13 ENCOUNTER — Ambulatory Visit: Payer: Medicare Other | Admitting: Radiation Oncology

## 2018-10-22 ENCOUNTER — Telehealth: Payer: Self-pay

## 2018-10-22 NOTE — Telephone Encounter (Signed)
Received dental clearance letter for zometa or prolia from [facility].

## 2018-10-27 ENCOUNTER — Other Ambulatory Visit: Payer: Self-pay

## 2018-10-27 ENCOUNTER — Encounter: Payer: Self-pay | Admitting: Radiation Oncology

## 2018-10-27 ENCOUNTER — Ambulatory Visit
Admission: RE | Admit: 2018-10-27 | Discharge: 2018-10-27 | Disposition: A | Discharge status: Home / Self Care | Payer: Medicare Other | Source: Ambulatory Visit | Attending: Radiation Oncology | Admitting: Radiation Oncology

## 2018-10-27 VITALS — BP 171/76 | HR 69 | Temp 98.0°F | Resp 16 | Wt 153.3 lb

## 2018-10-27 DIAGNOSIS — Z170 Estrogen receptor positive status [ER+]: Secondary | ICD-10-CM | POA: Insufficient documentation

## 2018-10-27 DIAGNOSIS — C50312 Malignant neoplasm of lower-inner quadrant of left female breast: Secondary | ICD-10-CM | POA: Diagnosis not present

## 2018-10-27 DIAGNOSIS — Z79811 Long term (current) use of aromatase inhibitors: Secondary | ICD-10-CM | POA: Insufficient documentation

## 2018-10-27 DIAGNOSIS — Z923 Personal history of irradiation: Secondary | ICD-10-CM | POA: Diagnosis not present

## 2018-10-27 DIAGNOSIS — C50912 Malignant neoplasm of unspecified site of left female breast: Secondary | ICD-10-CM

## 2018-10-27 NOTE — Progress Notes (Signed)
Radiation Oncology Follow up Note  Name: Jackie Allison   Date:   10/27/2018 MRN:  [id number] DOB: 10-11-1940    This 78 y.o. 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  REFERRING PROVIDER: Dion Body, MD  HPI: Patient is an 78 year old female now at 1 month having completed whole breast radiation to her left breast for stage II (T2 N0 M0) ER positive PR negative HER-2/neu negative invasive mammary carcinoma status post wide local excision.  Seen today in routine follow-up she is doing well.  She specifically denies breast tenderness cough or bone pain..  She is started arimadex is tolerating that well without side effect.  COMPLICATIONS OF TREATMENT: none  FOLLOW UP COMPLIANCE: keeps appointments   PHYSICAL EXAM:  BP (!) 171/76 (BP Location: Left Arm, Patient Position: Sitting)   Pulse 69   Temp 98 F (36.7 C) (Tympanic)   Resp 16   Wt 153 lb 5.3 oz (69.6 kg)   BMI 24.01 kg/m  Lungs are clear to A&P cardiac examination essentially unremarkable with regular rate and rhythm. No dominant mass or nodularity is noted in either breast in 2 positions examined. Incision is well-healed. No axillary or supraclavicular adenopathy is appreciated. Cosmetic result is excellent.  Well-developed well-nourished patient in NAD. HEENT reveals PERLA, EOMI, discs not visualized.  Oral cavity is clear. No oral mucosal lesions are identified. Neck is clear without evidence of cervical or supraclavicular adenopathy. Lungs are clear to A&P. Cardiac examination is essentially unremarkable with regular rate and rhythm without murmur rub or thrill. Abdomen is benign with no organomegaly or masses noted. Motor sensory and DTR levels are equal and symmetric in the upper and lower extremities. Cranial nerves II through XII are grossly intact. Proprioception is intact.  No peripheral adenopathy or edema is identified. No motor or sensory levels are noted. Crude visual fields are within normal range.  RADIOLOGY RESULTS: No current films for review  PLAN: Present time she is recovering nicely from whole breast radiation.  I am pleased with her overall progress.  She is started Arimidex and tolerating that well without side effect.  I have asked to see her back in about 4 to 5 months for follow-up.  Patient knows to call with any concerns.  I would like to take this opportunity to thank you for allowing me to participate in the care of your patient.Noreene Filbert, MD

## 2018-11-01 ENCOUNTER — Other Ambulatory Visit: Payer: Self-pay | Admitting: Oncology

## 2018-11-01 DIAGNOSIS — M8580 Other specified disorders of bone density and structure, unspecified site: Secondary | ICD-10-CM | POA: Insufficient documentation

## 2018-11-11 ENCOUNTER — Other Ambulatory Visit: Payer: Self-pay

## 2018-11-11 ENCOUNTER — Ambulatory Visit
Admission: RE | Admit: 2018-11-11 | Discharge: 2018-11-11 | Disposition: A | Discharge status: Home / Self Care | Payer: Medicare Other | Source: Ambulatory Visit | Attending: Oncology | Admitting: Oncology

## 2018-11-11 DIAGNOSIS — M8589 Other specified disorders of bone density and structure, multiple sites: Secondary | ICD-10-CM | POA: Insufficient documentation

## 2018-11-11 DIAGNOSIS — C50312 Malignant neoplasm of lower-inner quadrant of left female breast: Secondary | ICD-10-CM | POA: Diagnosis not present

## 2018-11-11 DIAGNOSIS — Z170 Estrogen receptor positive status [ER+]: Secondary | ICD-10-CM | POA: Diagnosis present

## 2018-11-16 ENCOUNTER — Inpatient Hospital Stay: Payer: Medicare Other

## 2018-11-16 ENCOUNTER — Inpatient Hospital Stay (HOSPITAL_BASED_OUTPATIENT_CLINIC_OR_DEPARTMENT_OTHER): Payer: Medicare Other | Admitting: Oncology

## 2018-11-16 ENCOUNTER — Encounter: Payer: Self-pay | Admitting: Oncology

## 2018-11-16 ENCOUNTER — Inpatient Hospital Stay: Payer: Medicare Other | Attending: Oncology

## 2018-11-16 ENCOUNTER — Other Ambulatory Visit: Payer: Self-pay

## 2018-11-16 VITALS — BP 152/72 | HR 68 | Temp 98.0°F | Resp 16 | Wt 154.6 lb

## 2018-11-16 DIAGNOSIS — D509 Iron deficiency anemia, unspecified: Secondary | ICD-10-CM

## 2018-11-16 DIAGNOSIS — Z170 Estrogen receptor positive status [ER+]: Secondary | ICD-10-CM | POA: Diagnosis not present

## 2018-11-16 DIAGNOSIS — M8580 Other specified disorders of bone density and structure, unspecified site: Secondary | ICD-10-CM | POA: Diagnosis not present

## 2018-11-16 DIAGNOSIS — Z7982 Long term (current) use of aspirin: Secondary | ICD-10-CM

## 2018-11-16 DIAGNOSIS — E119 Type 2 diabetes mellitus without complications: Secondary | ICD-10-CM | POA: Diagnosis not present

## 2018-11-16 DIAGNOSIS — R600 Localized edema: Secondary | ICD-10-CM

## 2018-11-16 DIAGNOSIS — Z7984 Long term (current) use of oral hypoglycemic drugs: Secondary | ICD-10-CM | POA: Diagnosis not present

## 2018-11-16 DIAGNOSIS — C50912 Malignant neoplasm of unspecified site of left female breast: Secondary | ICD-10-CM | POA: Insufficient documentation

## 2018-11-16 DIAGNOSIS — D508 Other iron deficiency anemias: Secondary | ICD-10-CM | POA: Diagnosis not present

## 2018-11-16 DIAGNOSIS — Z791 Long term (current) use of non-steroidal anti-inflammatories (NSAID): Secondary | ICD-10-CM | POA: Diagnosis not present

## 2018-11-16 DIAGNOSIS — Z803 Family history of malignant neoplasm of breast: Secondary | ICD-10-CM | POA: Diagnosis not present

## 2018-11-16 DIAGNOSIS — E871 Hypo-osmolality and hyponatremia: Secondary | ICD-10-CM | POA: Insufficient documentation

## 2018-11-16 DIAGNOSIS — L299 Pruritus, unspecified: Secondary | ICD-10-CM

## 2018-11-16 DIAGNOSIS — Z79899 Other long term (current) drug therapy: Secondary | ICD-10-CM | POA: Insufficient documentation

## 2018-11-16 DIAGNOSIS — I10 Essential (primary) hypertension: Secondary | ICD-10-CM

## 2018-11-16 DIAGNOSIS — E7800 Pure hypercholesterolemia, unspecified: Secondary | ICD-10-CM

## 2018-11-16 DIAGNOSIS — Z79811 Long term (current) use of aromatase inhibitors: Secondary | ICD-10-CM

## 2018-11-16 DIAGNOSIS — Z853 Personal history of malignant neoplasm of breast: Secondary | ICD-10-CM

## 2018-11-16 DIAGNOSIS — C50312 Malignant neoplasm of lower-inner quadrant of left female breast: Secondary | ICD-10-CM

## 2018-11-16 LAB — COMPREHENSIVE METABOLIC PANEL
ALT: 17 U/L (ref 0–44)
AST: 18 U/L (ref 15–41)
Albumin: 3.9 g/dL (ref 3.5–5.0)
Alkaline Phosphatase: 45 U/L (ref 38–126)
Anion gap: 11 (ref 5–15)
BUN: 14 mg/dL (ref 8–23)
CO2: 20 mmol/L — ABNORMAL LOW (ref 22–32)
Calcium: 9.6 mg/dL (ref 8.9–10.3)
Chloride: 97 mmol/L — ABNORMAL LOW (ref 98–111)
Creatinine, Ser: 0.75 mg/dL (ref 0.44–1.00)
GFR calc Af Amer: >60 mL/min (ref >60)
GFR calc non Af Amer: >60 mL/min (ref >60)
Glucose, Bld: 122 mg/dL — ABNORMAL HIGH (ref 70–99)
Potassium: 3.8 mmol/L (ref 3.5–5.1)
Sodium: 128 mmol/L — ABNORMAL LOW (ref 135–145)
Total Bilirubin: 0.6 mg/dL (ref 0.3–1.2)
Total Protein: 7.5 g/dL (ref 6.5–8.1)

## 2018-11-16 LAB — RETIC PANEL
Immature Retic Fract: 4.1 % (ref 2.3–15.9)
RBC.: 4.53 MIL/uL (ref 3.87–5.11)
Retic Count, Absolute: 32.2 10*3/uL (ref 19.0–186.0)
Retic Ct Pct: 0.7 % (ref 0.4–3.1)
Reticulocyte Hemoglobin: 30.9 pg (ref >27.9)

## 2018-11-16 LAB — CBC WITH DIFFERENTIAL/PLATELET
Abs Immature Granulocytes: 0.01 10*3/uL (ref 0.00–0.07)
Basophils Absolute: 0 10*3/uL (ref 0.0–0.1)
Basophils Relative: 1 %
Eosinophils Absolute: 0 10*3/uL (ref 0.0–0.5)
Eosinophils Relative: 1 %
HCT: 34.2 % — ABNORMAL LOW (ref 36.0–46.0)
Hemoglobin: 11.4 g/dL — ABNORMAL LOW (ref 12.0–15.0)
Immature Granulocytes: 0 %
Lymphocytes Relative: 14 %
Lymphs Abs: 0.5 10*3/uL — ABNORMAL LOW (ref 0.7–4.0)
MCH: 25.2 pg — ABNORMAL LOW (ref 26.0–34.0)
MCHC: 33.3 g/dL (ref 30.0–36.0)
MCV: 75.5 fL — ABNORMAL LOW (ref 80.0–100.0)
Monocytes Absolute: 0.5 10*3/uL (ref 0.1–1.0)
Monocytes Relative: 14 %
Neutro Abs: 2.7 10*3/uL (ref 1.7–7.7)
Neutrophils Relative %: 70 %
Platelets: 272 10*3/uL (ref 150–400)
RBC: 4.53 MIL/uL (ref 3.87–5.11)
RDW: 14.6 % (ref 11.5–15.5)
WBC: 3.8 10*3/uL — ABNORMAL LOW (ref 4.0–10.5)
nRBC: 0 % (ref 0.0–0.2)

## 2018-11-16 MED ORDER — ZOLEDRONIC ACID 4 MG/100ML IV SOLN
4.0000 mg | INTRAVENOUS | Status: DC
  Administered 2018-11-16: 4 mg via INTRAVENOUS
  Filled 2018-11-16: qty 100

## 2018-11-16 MED ORDER — SODIUM CHLORIDE 0.9 % IV SOLN
Freq: Once | INTRAVENOUS | Status: AC
  Administered 2018-11-16: 15:00:00 via INTRAVENOUS
  Filled 2018-11-16: qty 250

## 2018-11-16 NOTE — Progress Notes (Signed)
Hematology/Oncology follow up note [facility] Telephone:([phone] Fax:([phone]) [phone]   Patient Care Team: Dion Body, MD as PCP - General (Family Medicine)  REASON FOR VISIT:  Follow up for management  of Stage IIA breast cancer  HISTORY OF PRESENTING ILLNESS:  Jackie Allison is a  78 y.o.  female with PMH listed below who was referred to me for evaluation of breast cancer Self palpated left breast mass for 1 month.  Patient had mammogram and ultrasound on 03/11/2018 which showed suspicious left breast mass, indeterminate left aixllary lymph node.  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  Nipple discharge: denies.  Family history: great niece had breast cancer OCP use: denies.  Estrogen and progesterone therapy: denies History of radiation to chest: denies.  Previous breast surgery: previous right breast biopsy.    #  04/13/2018 underwent left lumpectomy and sentinel lymph node biopsy on Pathology showed invasive mammary carcinoma, negative margin, sentinel lymph nodes negative 0/5,  Grade 2, ER positive, PR negative HER-2 negative[performed on previous biopsy].Lymphovascular invasion: Not identified  Patient developed left breast abscess and infection status post I&D and debridement on 04/28/2018 # 06/19/2018 wound infection. Surgical wound culture positive for MRSA, as well as enterococcus faecalis. Patient was discharged on 06/22/2018.  Oncotype DX recurrence score came back at 27, predicting distant recurrence risk at 9 years with tamoxifen alone around 16%.  Group average absolute chemotherapy benefit >15%.  Patient declined chemotherapy.   # finished adjuvant radiation-last RT 09/27/2018  INTERVAL HISTORY Jackie Allison is a 78 y.o. female who has above history reviewed by me today presents for follow up visit for management of stage IIA breast cancer. Started on Arimidex 87m  daily In June 2020.  Reports feeling well. Tolerate Arimidex except ichiness for a few hours after taking medication. No rash.  No other vasovagal symptoms.   Review of Systems  Constitutional: Positive for fatigue. Negative for appetite change, chills and fever.  HENT:   Negative for hearing loss and voice change.   Eyes: Negative for eye problems.  Respiratory: Negative for chest tightness and cough.   Cardiovascular: Negative for chest pain and leg swelling.  Gastrointestinal: Negative for abdominal distention, abdominal pain and blood in stool.  Endocrine: Negative for hot flashes.  Genitourinary: Negative for difficulty urinating and frequency.   Musculoskeletal: Negative for arthralgias.       Chronic back pain  Skin: Positive for itching. Negative for rash.  Neurological: Negative for extremity weakness.  Hematological: Negative for adenopathy.  Psychiatric/Behavioral: Negative for confusion.    MEDICAL HISTORY:  Past Medical History:  Diagnosis Date  . Anemia    chronic microcytic anemia  . Breast abscess 06/20/2018  . [place]   [place] breast  . [place]   . GERD (gastroesophageal reflux disease)   . Glaucoma   . Hypercholesterolemia   . Hypertension     SURGICAL HISTORY: Past Surgical History:  Procedure Laterality Date  . BACK SURGERY    . BREAST BIOPSY Right    stereo- neg  . BREAST BIOPSY Left 02/2018  . BREAST EXCISIONAL BIOPSY Left 04/03/2018   left breast cancer lumpectomy   . BREAST LUMPECTOMY Left 04/03/2018  . CATARACT EXTRACTION W/ INTRAOCULAR LENS  IMPLANT, BILATERAL    . EYE SURGERY Bilateral    cataract extraction  . INCISION AND DRAINAGE ABSCESS Left 04/28/2018   Procedure: INCISION AND DRAINAGE ABSCESS;  Surgeon: Benjamine Sprague, DO;  Location: [facility] ORS;  Service: General;  Laterality: Left;  . INCISION AND DRAINAGE ABSCESS Left 06/20/2018   Procedure: INCISION AND DRAINAGE ABSCESS;  Surgeon:  Herbert Pun, MD;  Location: [facility] ORS;  Service: General;  Laterality: Left;  . LUMBAR FUSION  2002?  . LUMBAR LAMINECTOMY  1997   titanium in back  . PARTIAL MASTECTOMY WITH AXILLARY SENTINEL LYMPH NODE BIOPSY Left 04/13/2018   Procedure: PARTIAL MASTECTOMY WITH AXILLARY SENTINEL LYMPH NODE BIOPSY  (No Needle Loc);  Surgeon: Benjamine Sprague, DO;  Location: [facility] ORS;  Service: General;  Laterality: Left;  . RETINAL DETACHMENT SURGERY Bilateral 2009  . TUBAL LIGATION      SOCIAL HISTORY: Social History   Socioeconomic History  . Marital status: Married    Spouse name: [name]  . Number of children: Not on file  . Years of education: Not on file  . Highest education level: Not on file  Occupational History  . Occupation: Restaurant manager, fast food estate  Social Needs  . Financial resource strain: Not on file  . Food insecurity    Worry: Not on file    Inability: Not on file  . Transportation needs    Medical: Not on file    Non-medical: Not on file  Tobacco Use  . Smoking status: Never Smoker  . Smokeless tobacco: Never Used  Substance and Sexual Activity  . Alcohol use: Never    Frequency: Never  . Drug use: Never  . Sexual activity: Not on file  Lifestyle  . Physical activity    Days per week: Not on file    Minutes per session: Not on file  . Stress: Not on file  Relationships  . Social Herbalist on phone: Not on file    Gets together: Not on file    Attends religious service: Not on file    Active member of club or organization: Not on file    Attends meetings of clubs or organizations: Not on file    Relationship status: Not on file  . Intimate partner violence    Fear of current or ex partner: Not on file    Emotionally abused: Not on file    Physically abused: Not on file    Forced sexual activity: Not on file  Other Topics Concern  . Not on file  Social History Narrative  . Not on file    FAMILY HISTORY: Family History  Problem Relation Age of  Onset  . Diabetes Sister   . Diabetes Brother   . Diabetes Sister   . Breast cancer Other   . Hypertension Mother   . Glaucoma Mother   . Heart attack Father     ALLERGIES:  is allergic to shellfish allergy; iodine; and sulfa antibiotics.  MEDICATIONS:  Current Outpatient Medications  Medication Sig Dispense Refill  . acetaminophen (TYLENOL) 500 MG tablet Take 500 mg by mouth every 6 (six) hours as needed for moderate pain.     Marland Kitchen anastrozole (ARIMIDEX) 1 MG tablet Take 1 tablet (1 mg total) by mouth daily. 90 tablet 1  . atorvastatin (LIPITOR) 10 MG tablet Take 10 mg by mouth every evening.     . brimonidine (ALPHAGAN) 0.2 % ophthalmic solution Place 1 drop into the right eye 2 (two) times daily.     . carvedilol (COREG) 12.5 MG tablet Take 12.5 mg by mouth 2 (two) times daily with a meal.     . chlorhexidine (PERIDEX) 0.12 %  solution Use as directed 15 mLs in the mouth or throat 2 (two) times daily.   99  . docusate sodium (COLACE) 100 MG capsule Take 100 mg by mouth daily as needed for mild constipation.    . dorzolamide-timolol (COSOPT) 22.3-6.8 MG/ML ophthalmic solution Place 1 drop into both eyes 2 (two) times daily.    . ferrous sulfate 325 (65 FE) MG EC tablet Take 1 tablet (325 mg total) by mouth 2 (two) times daily with a meal. 60 tablet 1  . hydrALAZINE (APRESOLINE) 100 MG tablet Take 100 mg by mouth 3 (three) times daily.   3  . ibuprofen (ADVIL,MOTRIN) 800 MG tablet Take 1 tablet (800 mg total) by mouth every 8 (eight) hours as needed for mild pain or moderate pain. 30 tablet 0  . latanoprost (XALATAN) 0.005 % ophthalmic solution Place 1 drop into both eyes at bedtime.     . metFORMIN (GLUCOPHAGE) 500 MG tablet Take 500 mg by mouth daily with breakfast.     . Multiple Vitamin (MULTI-VITAMINS) TABS Take 1 tablet by mouth daily.     Marland Kitchen omeprazole (PRILOSEC) 40 MG capsule Take 40 mg by mouth daily.     Marland Kitchen Specialty Vitamins Products (MAGNESIUM, AMINO ACID CHELATE,) 133 MG tablet  Take 1 tablet by mouth daily.    Marland Kitchen trolamine salicylate (ASPERCREME) 10 % cream Apply 1 application topically as needed for muscle pain.    Marland Kitchen aspirin 81 MG EC tablet Take 81 mg by mouth daily.     . benazepril (LOTENSIN) 40 MG tablet Take 40 mg by mouth daily.     . Coenzyme Q10 (COQ10 PO) Take 1 tablet by mouth daily.    Marland Kitchen lidocaine-prilocaine (EMLA) cream Apply to affected area once (Patient not taking: Reported on 10/05/2018) 30 g 3   No current facility-administered medications for this visit.      PHYSICAL EXAMINATION: ECOG PERFORMANCE STATUS: 1 - Symptomatic but completely ambulatory Vitals:   11/16/18 1312  BP: (!) 152/72  Pulse: 68  Resp: 16  Temp: 98 F (36.7 C)   Filed Weights   11/16/18 1312  Weight: 154 lb 9.6 oz (70.1 kg)    Physical Exam Constitutional:      General: She is not in acute distress. HENT:     Head: Normocephalic and atraumatic.  Eyes:     General: No scleral icterus.    Pupils: Pupils are equal, round, and reactive to light.  Neck:     Musculoskeletal: Normal range of motion and neck supple.  Cardiovascular:     Rate and Rhythm: Normal rate and regular rhythm.     Heart sounds: Normal heart sounds.  Pulmonary:     Effort: Pulmonary effort is normal. No respiratory distress.     Breath sounds: No wheezing.  Abdominal:     General: Bowel sounds are normal. There is no distension.     Palpations: Abdomen is soft. There is no mass.     Tenderness: There is no abdominal tenderness.  Musculoskeletal: Normal range of motion.        General: No deformity.  Skin:    General: Skin is warm and dry.     Findings: No erythema or rash.  Neurological:     Mental Status: She is alert and oriented to person, place, and time.     Cranial Nerves: No cranial nerve deficit.     Coordination: Coordination normal.  Psychiatric:        Behavior: Behavior normal.  Thought Content: Thought content normal.   left breast s/p radiation, skin  hyperpigmentation.    LABORATORY DATA:  I have reviewed the data as listed Lab Results  Component Value Date   WBC 3.8 (L) 11/16/2018   HGB 11.4 (L) 11/16/2018   HCT 34.2 (L) 11/16/2018   MCV 75.5 (L) 11/16/2018   PLT 272 11/16/2018   Recent Labs    06/21/18 0540 06/28/18 0826 11/16/18 1245  NA 128* 129* 128*  K 3.6 3.4* 3.8  CL 98 98 97*  CO2 19* 21* 20*  GLUCOSE 208* 119* 122*  BUN _0 CREATININE 0.75 0.82 0.75  CALCIUM 8.6* 9.3 9.6  GFRNONAA >60 >60 >60  GFRAA >60 >60 >60  PROT 6.4* 7.3 7.5  ALBUMIN 2.7* 3.5 3.9  AST 16 13* 18  ALT _1 ALKPHOS 58 53 45  BILITOT 0.5 0.6 0.6   Iron/TIBC/Ferritin/ %Sat    Component Value Date/Time   IRON 51 06/02/2018 1120   TIBC 332 06/02/2018 1120   FERRITIN 15 06/02/2018 1120   IRONPCTSAT 15 06/02/2018 1120     RADIOGRAPHIC STUDIES: I have personally reviewed the radiological images as listed and agreed with the findings in the report. 03/11/2018 Diagnostic Mammogram and [place]. A radiopaque BB was placed at the site of the patient's palpable lump in the lower inner left breast at far posterior depth. A spiculated hyperdense mass is seen deep to the radiopaque BB along the chest wall. No additional suspicious findings are identified within either breast. Further evaluation with ultrasound was performed. Mammographic images were processed with CAD.  On physical exam, I palpate a firm, fixed 2-3 cm mass along the inframammary fold of the 7 o'clock position. Targeted ultrasound is performed, showing an irregular hypoechoic mass with posterior acoustic shadowing at the 7:30 position 11 cm from the nipple. It measures 2.7 x 2.5 x 2.0 cm. There is associated vascularity. This correlates well with the mammographic finding. Evaluation of the axilla demonstrates a single axillary lymph node with borderline cortical thickening of 0.4 cm. IMPRESSION: 1. Highly suspicious left breast mass corresponding with the patient's palpable  lump. Recommendation is for ultrasound-guided biopsy. 2. Indeterminate left axillary lymph node. Recommendation is for ultrasound-guided biopsy.  Same as pre-op    ASSESSMENT & PLAN:  1. History of breast cancer   2. Osteopenia, unspecified location   3. Microcytic anemia   4. Other iron deficiency anemia   5. Itchy skin   Cancer Staging Breast cancer in female [facility]) Staging form: Breast, AJCC 8th Edition - Clinical: No stage assigned - Unsigned - Pathologic stage from 06/11/2018: Stage IIA (pT2, pN0, cM0, G2, ER+, PR-, HER2-, Oncotype DX score: 27) - Signed by Earlie Server, MD on 06/11/2018  # Stage IIA pT2 pN0 ER positive invasive mammary carcinoma. Tolerate Arimidex well except itchiness.  Discussed that ichy can be secondary to dry skin, recommend applying skin moisturizer  Continue Arimidex.  She is due for annual diagnostic mammogram in December 2020 # Microcytic anemia/ Iron deficiency, can be due to blood loss from surgery. hemoglobin improved. Recommend her to continue taking oral iron supplementation. Future GI referral.   # Osteopenia, continue calcium and vitamin D supplements.DEXA 11/11/2018 findings revealed osteopenia.  Recommend bisphosphate treatment with Zometa Q6 months.  Also she has high Oncotype Dx score and adjuvant Zometa may decreased future skeletal metastasis events.  She has got dental clearance. We discussed about side effects of Zometa including but not limited to electrolyte  imbalance, bone muscle pain, osteonecrosis, nausea/vomiting, etc.    # Microcytic anemia /iron deficiency, continue oral iron supplementation. Future GI referral.  # Chronic hyponatremia. Sodium 129, 09/22/2018 [place] labs.  Labs are reviewed and discussed. Stable hyponatremia.    Orders Placed This Encounter  Procedures  . Retic Panel    Standing Status:   Future    Standing Expiration Date:   11/16/2019    Return of visit: 3 months.  We spent sufficient time to discuss many aspect  of care, questions were answered to patient's satisfaction. Total face to face encounter time for this patient visit was 25 min. >50% of the time was  spent in counseling and coordination of care.     Earlie Server, MD, PhD  11/16/2018

## 2018-11-16 NOTE — Progress Notes (Signed)
Patient is asking if anastrozole could cause her to itch.  She takes the med after dinner and starts itching 2.5 hours later.

## 2018-11-18 ENCOUNTER — Telehealth: Payer: Self-pay | Admitting: *Deleted

## 2018-11-18 NOTE — Telephone Encounter (Signed)
Patient called reporting swelling in her finger joints and is asking of it could be coming from the infusion (Zometa) she had. Please advise

## 2018-11-18 NOTE — Telephone Encounter (Signed)
Possible, close monitor. Should improve overtime, if not, please ask her to update [place]. Thanks.

## 2018-11-19 NOTE — Telephone Encounter (Signed)
Call returned to patient and advised per physician that it is possible that the Zometa could be causing her joint swelling in her hands and that it should go away. Advised if it doe snot, to call us back. Patient agreeable with this plan

## 2019-02-03 ENCOUNTER — Other Ambulatory Visit: Payer: Self-pay | Admitting: Oncology

## 2019-02-03 ENCOUNTER — Encounter: Payer: Self-pay | Admitting: Oncology

## 2019-02-11 ENCOUNTER — Other Ambulatory Visit: Payer: Self-pay

## 2019-02-11 NOTE — Progress Notes (Signed)
Patient pre screened today. She states that she has been having increased "arthritic" type pain.

## 2019-02-14 ENCOUNTER — Other Ambulatory Visit: Payer: Self-pay

## 2019-02-14 ENCOUNTER — Inpatient Hospital Stay (HOSPITAL_BASED_OUTPATIENT_CLINIC_OR_DEPARTMENT_OTHER): Payer: Medicare Other | Admitting: Oncology

## 2019-02-14 ENCOUNTER — Encounter: Payer: Self-pay | Admitting: Oncology

## 2019-02-14 ENCOUNTER — Inpatient Hospital Stay: Payer: Medicare Other | Attending: Oncology

## 2019-02-14 VITALS — BP 138/69 | HR 71 | Temp 96.8°F | Resp 16 | Wt 156.7 lb

## 2019-02-14 DIAGNOSIS — C50912 Malignant neoplasm of unspecified site of left female breast: Secondary | ICD-10-CM | POA: Diagnosis not present

## 2019-02-14 DIAGNOSIS — Z79899 Other long term (current) drug therapy: Secondary | ICD-10-CM | POA: Insufficient documentation

## 2019-02-14 DIAGNOSIS — Z7982 Long term (current) use of aspirin: Secondary | ICD-10-CM | POA: Diagnosis not present

## 2019-02-14 DIAGNOSIS — D72810 Lymphocytopenia: Secondary | ICD-10-CM | POA: Diagnosis not present

## 2019-02-14 DIAGNOSIS — Z7984 Long term (current) use of oral hypoglycemic drugs: Secondary | ICD-10-CM | POA: Insufficient documentation

## 2019-02-14 DIAGNOSIS — D508 Other iron deficiency anemias: Secondary | ICD-10-CM

## 2019-02-14 DIAGNOSIS — E119 Type 2 diabetes mellitus without complications: Secondary | ICD-10-CM | POA: Insufficient documentation

## 2019-02-14 DIAGNOSIS — D509 Iron deficiency anemia, unspecified: Secondary | ICD-10-CM

## 2019-02-14 DIAGNOSIS — Z853 Personal history of malignant neoplasm of breast: Secondary | ICD-10-CM

## 2019-02-14 DIAGNOSIS — Z791 Long term (current) use of non-steroidal anti-inflammatories (NSAID): Secondary | ICD-10-CM | POA: Insufficient documentation

## 2019-02-14 DIAGNOSIS — Z170 Estrogen receptor positive status [ER+]: Secondary | ICD-10-CM | POA: Diagnosis not present

## 2019-02-14 DIAGNOSIS — E871 Hypo-osmolality and hyponatremia: Secondary | ICD-10-CM | POA: Insufficient documentation

## 2019-02-14 DIAGNOSIS — Z79811 Long term (current) use of aromatase inhibitors: Secondary | ICD-10-CM | POA: Insufficient documentation

## 2019-02-14 DIAGNOSIS — E7800 Pure hypercholesterolemia, unspecified: Secondary | ICD-10-CM | POA: Diagnosis not present

## 2019-02-14 DIAGNOSIS — M8580 Other specified disorders of bone density and structure, unspecified site: Secondary | ICD-10-CM

## 2019-02-14 DIAGNOSIS — I10 Essential (primary) hypertension: Secondary | ICD-10-CM | POA: Insufficient documentation

## 2019-02-14 LAB — CBC WITH DIFFERENTIAL/PLATELET
Abs Immature Granulocytes: 0.01 10*3/uL (ref 0.00–0.07)
Basophils Absolute: 0 10*3/uL (ref 0.0–0.1)
Basophils Relative: 0 %
Eosinophils Absolute: 0 10*3/uL (ref 0.0–0.5)
Eosinophils Relative: 1 %
HCT: 31.4 % — ABNORMAL LOW (ref 36.0–46.0)
Hemoglobin: 10.4 g/dL — ABNORMAL LOW (ref 12.0–15.0)
Immature Granulocytes: 0 %
Lymphocytes Relative: 15 %
Lymphs Abs: 0.4 10*3/uL — ABNORMAL LOW (ref 0.7–4.0)
MCH: 25.2 pg — ABNORMAL LOW (ref 26.0–34.0)
MCHC: 33.1 g/dL (ref 30.0–36.0)
MCV: 76.2 fL — ABNORMAL LOW (ref 80.0–100.0)
Monocytes Absolute: 0.3 10*3/uL (ref 0.1–1.0)
Monocytes Relative: 11 %
Neutro Abs: 1.9 10*3/uL (ref 1.7–7.7)
Neutrophils Relative %: 73 %
Platelets: 221 10*3/uL (ref 150–400)
RBC: 4.12 MIL/uL (ref 3.87–5.11)
RDW: 14.6 % (ref 11.5–15.5)
WBC: 2.6 10*3/uL — ABNORMAL LOW (ref 4.0–10.5)
nRBC: 0 % (ref 0.0–0.2)

## 2019-02-14 LAB — COMPREHENSIVE METABOLIC PANEL
ALT: 19 U/L (ref 0–44)
AST: 23 U/L (ref 15–41)
Albumin: 3.8 g/dL (ref 3.5–5.0)
Alkaline Phosphatase: 51 U/L (ref 38–126)
Anion gap: 10 (ref 5–15)
BUN: 14 mg/dL (ref 8–23)
CO2: 20 mmol/L — ABNORMAL LOW (ref 22–32)
Calcium: 9 mg/dL (ref 8.9–10.3)
Chloride: 97 mmol/L — ABNORMAL LOW (ref 98–111)
Creatinine, Ser: 0.94 mg/dL (ref 0.44–1.00)
GFR calc Af Amer: >60 mL/min (ref >60)
GFR calc non Af Amer: 57 mL/min — ABNORMAL LOW (ref >60)
Glucose, Bld: 123 mg/dL — ABNORMAL HIGH (ref 70–99)
Potassium: 3.6 mmol/L (ref 3.5–5.1)
Sodium: 127 mmol/L — ABNORMAL LOW (ref 135–145)
Total Bilirubin: 0.5 mg/dL (ref 0.3–1.2)
Total Protein: 7.4 g/dL (ref 6.5–8.1)

## 2019-02-14 LAB — RETIC PANEL
Immature Retic Fract: 6.7 % (ref 2.3–15.9)
RBC.: 4.12 MIL/uL (ref 3.87–5.11)
Retic Count, Absolute: 30.5 10*3/uL (ref 19.0–186.0)
Retic Ct Pct: 0.7 % (ref 0.4–3.1)
Reticulocyte Hemoglobin: 30.3 pg (ref >27.9)

## 2019-02-14 LAB — FERRITIN: Ferritin: 56 ng/mL (ref 11–307)

## 2019-02-14 NOTE — Progress Notes (Signed)
Hematology/Oncology follow up note [facility] Telephone:([phone] [phone]   Patient Care Team: Dion Body, MD as PCP - General (Family Medicine)  REASON FOR VISIT:  Follow up for management  of Stage IIA breast cancer  HISTORY OF PRESENTING ILLNESS:  Jackie Allison is a  78 y.o.  female with PMH listed below who was referred to me for evaluation of breast cancer Self palpated left breast mass for 1 month.  Patient had mammogram and ultrasound on 03/11/2018 which showed suspicious left breast mass, indeterminate left aixllary lymph node.  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  Nipple discharge: denies.  Family history: great niece had breast cancer OCP use: denies.  Estrogen and progesterone therapy: denies History of radiation to chest: denies.  Previous breast surgery: previous right breast biopsy.    #  04/13/2018 underwent left lumpectomy and sentinel lymph node biopsy on Pathology showed invasive mammary carcinoma, negative margin, sentinel lymph nodes negative 0/5,  Grade 2, ER positive, PR negative HER-2 negative[performed on previous biopsy].Lymphovascular invasion: Not identified  Patient developed left breast abscess and infection status post I&D and debridement on 04/28/2018 # 06/19/2018 wound infection. Surgical wound culture positive for MRSA, as well as enterococcus faecalis. Patient was discharged on 06/22/2018.  Oncotype DX recurrence score came back at 27, predicting distant recurrence risk at 9 years with tamoxifen alone around 16%.  Group average absolute chemotherapy benefit >15%.  Patient declined chemotherapy.   # finished adjuvant radiation-last RT 09/27/2018 #Started on adjuvant antiestrogen treatment with Arimidex 1 mg daily since June 2020.  #Osteopenic, received a dental clearance for Zometa.  Started on Zometa 4 mg every 6 months.  Received  last dose on 11/16/2018.  INTERVAL HISTORY Jackie Allison is a 78 y.o. female who has above history reviewed by me today presents for follow up visit for management of stage IIA breast cancer. Patient has been taking Arimidex 1 mg daily. Overall tolerating well with mild difficulties Patient reports increased joint pains/swellings.   Also has had intermittent breast tenderness.  No exacerbating or alleviating factors. Denies any new bone pain.    Review of Systems  Constitutional: Positive for fatigue. Negative for appetite change, chills and fever.  HENT:   Negative for hearing loss and voice change.   Eyes: Negative for eye problems.  Respiratory: Negative for chest tightness and cough.   Cardiovascular: Negative for chest pain and leg swelling.  Gastrointestinal: Negative for abdominal distention, abdominal pain and blood in stool.  Endocrine: Negative for hot flashes.  Genitourinary: Negative for difficulty urinating and frequency.   Musculoskeletal: Positive for arthralgias.       Chronic back pain  Skin: Negative for itching and rash.  Neurological: Negative for extremity weakness.  Hematological: Negative for adenopathy.  Psychiatric/Behavioral: Negative for confusion.    MEDICAL HISTORY:  Past Medical History:  Diagnosis Date  . Anemia    chronic microcytic anemia  . Breast abscess 06/20/2018  . Cancer ([place]) 2019   left breast  . Diabetes mellitus without complication ([place])   . GERD (gastroesophageal reflux disease)   . Glaucoma   . Hypercholesterolemia   . Hypertension     SURGICAL HISTORY: Past Surgical History:  Procedure Laterality Date  . BACK SURGERY    . BREAST BIOPSY Right    stereo- neg  . BREAST BIOPSY Left 02/2018  . BREAST EXCISIONAL BIOPSY Left 04/03/2018   left breast cancer lumpectomy   .  BREAST LUMPECTOMY Left 04/03/2018  . CATARACT EXTRACTION W/ INTRAOCULAR LENS  IMPLANT, BILATERAL    . EYE SURGERY Bilateral    cataract extraction  .  INCISION AND DRAINAGE ABSCESS Left 04/28/2018   Procedure: INCISION AND DRAINAGE ABSCESS;  Surgeon: Benjamine Sprague, DO;  Location: [facility] ORS;  Service: General;  Laterality: Left;  . INCISION AND DRAINAGE ABSCESS Left 06/20/2018   Procedure: INCISION AND DRAINAGE ABSCESS;  Surgeon: Herbert Pun, MD;  Location: [facility] ORS;  Service: General;  Laterality: Left;  . LUMBAR FUSION  2002?  . LUMBAR LAMINECTOMY  1997   titanium in back  . PARTIAL MASTECTOMY WITH AXILLARY SENTINEL LYMPH NODE BIOPSY Left 04/13/2018   Procedure: PARTIAL MASTECTOMY WITH AXILLARY SENTINEL LYMPH NODE BIOPSY  (No Needle Loc);  Surgeon: Benjamine Sprague, DO;  Location: [facility] ORS;  Service: General;  Laterality: Left;  . RETINAL DETACHMENT SURGERY Bilateral 2009  . TUBAL LIGATION      SOCIAL HISTORY: Social History   Socioeconomic History  . Marital status: Married    Spouse name: Jackie Allison  . Number of children: Not on file  . Years of education: Not on file  . Highest education level: Not on file  Occupational History  . Occupation: Restaurant manager, fast food estate  Social Needs  . Financial resource strain: Not on file  . Food insecurity    Worry: Not on file    Inability: Not on file  . Transportation needs    Medical: Not on file    Non-medical: Not on file  Tobacco Use  . Smoking status: Never Smoker  . Smokeless tobacco: Never Used  Substance and Sexual Activity  . Alcohol use: Never    Frequency: Never  . Drug use: Never  . Sexual activity: Not on file  Lifestyle  . Physical activity    Days per week: Not on file    Minutes per session: Not on file  . Stress: Not on file  Relationships  . Social Herbalist on phone: Not on file    Gets together: Not on file    Attends religious service: Not on file    Active member of club or organization: Not on file    Attends meetings of clubs or organizations: Not on file    Relationship status: Not on file  . Intimate partner violence    Fear of current or ex  partner: Not on file    Emotionally abused: Not on file    Physically abused: Not on file    Forced sexual activity: Not on file  Other Topics Concern  . Not on file  Social History Narrative  . Not on file    FAMILY HISTORY: Family History  Problem Relation Age of Onset  . Diabetes Sister   . Diabetes Brother   . Diabetes Sister   . Breast cancer Other   . Hypertension Mother   . Glaucoma Mother   . Heart attack Father     ALLERGIES:  is allergic to shellfish allergy; iodine; and sulfa antibiotics.  MEDICATIONS:  Current Outpatient Medications  Medication Sig Dispense Refill  . acetaminophen (TYLENOL) 500 MG tablet Take 500 mg by mouth every 6 (six) hours as needed for moderate pain.     Marland Kitchen anastrozole (ARIMIDEX) 1 MG tablet Take 1 tablet (1 mg total) by mouth daily. 90 tablet 1  . aspirin 81 MG EC tablet Take 81 mg by mouth daily.     Marland Kitchen atorvastatin (LIPITOR) 10 MG tablet Take 10  mg by mouth every evening.     . benazepril (LOTENSIN) 40 MG tablet Take 40 mg by mouth daily.     . brimonidine (ALPHAGAN) 0.2 % ophthalmic solution Place 1 drop into the right eye 2 (two) times daily.     . carvedilol (COREG) 12.5 MG tablet Take 12.5 mg by mouth 2 (two) times daily with a meal.     . chlorhexidine (PERIDEX) 0.12 % solution Use as directed 15 mLs in the mouth or throat 2 (two) times daily.   99  . Coenzyme Q10 (COQ10 PO) Take 1 tablet by mouth daily.    Marland Kitchen docusate sodium (COLACE) 100 MG capsule Take 100 mg by mouth daily as needed for mild constipation.    . dorzolamide-timolol (COSOPT) 22.3-6.8 MG/ML ophthalmic solution Place 1 drop into both eyes 2 (two) times daily.    . ferrous sulfate 325 (65 FE) MG EC tablet TAKE 1 TABLET (325 MG TOTAL) BY MOUTH 2 (TWO) TIMES DAILY WITH A MEAL. 60 tablet 1  . hydrALAZINE (APRESOLINE) 100 MG tablet Take 100 mg by mouth 3 (three) times daily.   3  . ibuprofen (ADVIL,MOTRIN) 800 MG tablet Take 1 tablet (800 mg total) by mouth every 8 (eight)  hours as needed for mild pain or moderate pain. 30 tablet 0  . latanoprost (XALATAN) 0.005 % ophthalmic solution Place 1 drop into both eyes at bedtime.     . lidocaine-prilocaine (EMLA) cream Apply to affected area once 30 g 3  . metFORMIN (GLUCOPHAGE) 500 MG tablet Take 500 mg by mouth daily with breakfast.     . Multiple Vitamin (MULTI-VITAMINS) TABS Take 1 tablet by mouth daily.     Marland Kitchen omeprazole (PRILOSEC) 40 MG capsule Take 40 mg by mouth daily.     Marland Kitchen Specialty Vitamins Products (MAGNESIUM, AMINO ACID CHELATE,) 133 MG tablet Take 1 tablet by mouth daily.    Marland Kitchen trolamine salicylate (ASPERCREME) 10 % cream Apply 1 application topically as needed for muscle pain.     No current facility-administered medications for this visit.      PHYSICAL EXAMINATION: ECOG PERFORMANCE STATUS: 1 - Symptomatic but completely ambulatory Vitals:   02/14/19 1420  BP: 138/69  Pulse: 71  Resp: 16  Temp: (!) 96.8 F (36 C)   Filed Weights   02/14/19 1420  Weight: 156 lb 11.2 oz (71.1 kg)    Physical Exam Constitutional:      General: She is not in acute distress. HENT:     Head: Normocephalic and atraumatic.  Eyes:     General: No scleral icterus.    Pupils: Pupils are equal, round, and reactive to light.  Neck:     Musculoskeletal: Normal range of motion and neck supple.  Cardiovascular:     Rate and Rhythm: Normal rate and regular rhythm.     Heart sounds: Normal heart sounds.  Pulmonary:     Effort: Pulmonary effort is normal. No respiratory distress.     Breath sounds: No wheezing.  Abdominal:     General: Bowel sounds are normal. There is no distension.     Palpations: Abdomen is soft. There is no mass.     Tenderness: There is no abdominal tenderness.  Musculoskeletal: Normal range of motion.        General: No deformity.  Skin:    General: Skin is warm and dry.     Findings: No erythema or rash.  Neurological:     Mental Status: She is alert and  oriented to person, place, and  time.     Cranial Nerves: No cranial nerve deficit.     Coordination: Coordination normal.  Psychiatric:        Behavior: Behavior normal.        Thought Content: Thought content normal.   Breast exam was performed in seated and lying down position. Patient is status post left lumpectomy with a well-healed surgical scar.  Left breast Skin hyper pigmentation. Left breast slightly tender with palpation.  No palpable mass or palpable axillary lymph node adenopathy bilaterally.   LABORATORY DATA:  I have reviewed the data as listed Lab Results  Component Value Date   WBC 2.6 (L) 02/14/2019   HGB 10.4 (L) 02/14/2019   HCT 31.4 (L) 02/14/2019   MCV 76.2 (L) 02/14/2019   PLT 221 02/14/2019   Recent Labs    06/28/18 0826 11/16/18 1245 02/14/19 1320  NA 129* 128* 127*  K 3.4* 3.8 3.6  CL 98 97* 97*  CO2 21* 20* 20*  GLUCOSE 119* 122* 123*  BUN '14 14 14  '$ CREATININE 0.82 0.75 0.94  CALCIUM 9.3 9.6 9.0  GFRNONAA >60 >60 57*  GFRAA >60 >60 >60  PROT 7.3 7.5 7.4  ALBUMIN 3.5 3.9 3.8  AST 13* 18 23  ALT '15 17 19  '$ ALKPHOS 53 45 51  BILITOT 0.6 0.6 0.5   Iron/TIBC/Ferritin/ %Sat    Component Value Date/Time   IRON 51 06/02/2018 1120   TIBC 332 06/02/2018 1120   FERRITIN 56 02/14/2019 1320   IRONPCTSAT 15 06/02/2018 1120     RADIOGRAPHIC STUDIES: I have personally reviewed the radiological images as listed and agreed with the findings in the report. 03/11/2018 Diagnostic Mammogram and [place]. A radiopaque BB was placed at the site of the patient's palpable lump in the lower inner left breast at far posterior depth. A spiculated hyperdense mass is seen deep to the radiopaque BB along the chest wall. No additional suspicious findings are identified within either breast. Further evaluation with ultrasound was performed. Mammographic images were processed with CAD.  On physical exam, I palpate a firm, fixed 2-3 cm mass along the inframammary fold of the 7 o'clock position. Targeted  ultrasound is performed, showing an irregular hypoechoic mass with posterior acoustic shadowing at the 7:30 position 11 cm from the nipple. It measures 2.7 x 2.5 x 2.0 cm. There is associated vascularity. This correlates well with the mammographic finding. Evaluation of the axilla demonstrates a single axillary lymph node with borderline cortical thickening of 0.4 cm. IMPRESSION: 1. Highly suspicious left breast mass corresponding with the patient's palpable lump. Recommendation is for ultrasound-guided biopsy. 2. Indeterminate left axillary lymph node. Recommendation is for ultrasound-guided biopsy.  Same as pre-op    ASSESSMENT & PLAN:  1. History of breast cancer   2. Osteopenia, unspecified location   3. Microcytic anemia   4. Lymphopenia   Cancer Staging Breast cancer in female [facility]) Staging form: Breast, AJCC 8th Edition - Clinical: No stage assigned - Unsigned - Pathologic stage from 06/11/2018: Stage IIA (pT2, pN0, cM0, G2, ER+, PR-, HER2-, Oncotype DX score: 27) - Signed by Earlie Server, MD on 06/11/2018  # Stage IIA pT2 pN0 ER positive invasive mammary carcinoma. Patient tolerates Arimidex well except mild to moderate arthritis. Advised patient to continue use Tylenol as needed if joint pain. Continue Arimidex for now. She is due for annual diagnostic mammogram in December 2020.  Will obtain.  #Microcytic anemia/history of iron deficiency.  Ferritin has  improved to 56. Hemoglobin 10.4, decreased from 41-monthago-Chronic anemia, with microcytosis.   I will check hemoglobinopathy evaluation.  #Leukopenia, worsened.  Total WBC 2.6.  Predominantly lymphocytopenia with absolute lymphocyte 0.4 today. Will check folate, vitamin B12, flow cytometry,  # Osteopenia, continue calcium and vitamin D supplements.DEXA 11/11/2018 findings revealed osteopenia.  Recommend bisphosphate treatment with Zometa Q6 months.-Next dose due in January 2021.  # Chronic hyponatremia. Sodium 127,  Labs  are reviewed and discussed. Stable hyponatremia.  CT chest February 2020 showed no lung mass.   Orders Placed This Encounter  Procedures  . MM DIAG BREAST TOMO BILATERAL    Standing Status:   Future    Standing Expiration Date:   02/14/2020    Order Specific Question:   Reason for Exam (SYMPTOM  OR DIAGNOSIS REQUIRED)    Answer:   breast cancer    Order Specific Question:   Preferred imaging location?    Answer:   [facility]  . [facility]    Standing Status:   Future    Standing Expiration Date:   04/15/2020    Order Specific Question:   Reason for Exam (SYMPTOM  OR DIAGNOSIS REQUIRED)    Answer:   breast cancer    Order Specific Question:   Preferred imaging location?    Answer:   [facility]  . CBC with Differential/Platelet    Standing Status:   Future    Standing Expiration Date:   02/14/2020  . Technologist smear review    Standing Status:   Future    Standing Expiration Date:   02/14/2020  . Vitamin B12    Standing Status:   Future    Standing Expiration Date:   02/14/2020  . Folate    Standing Status:   Future    Standing Expiration Date:   02/14/2020  . Flow cytometry panel-leukemia/lymphoma work-up    Standing Status:   Future    Standing Expiration Date:   02/14/2020    Return of visit: To be determined.    ZEarlie Server MD, PhD  02/14/2019

## 2019-02-17 ENCOUNTER — Encounter: Payer: Self-pay | Admitting: Podiatry

## 2019-02-17 ENCOUNTER — Other Ambulatory Visit: Payer: Self-pay

## 2019-02-17 ENCOUNTER — Ambulatory Visit (INDEPENDENT_AMBULATORY_CARE_PROVIDER_SITE_OTHER): Payer: Medicare Other | Admitting: Podiatry

## 2019-02-17 DIAGNOSIS — M79675 Pain in left toe(s): Secondary | ICD-10-CM | POA: Diagnosis not present

## 2019-02-17 DIAGNOSIS — M79674 Pain in right toe(s): Secondary | ICD-10-CM | POA: Diagnosis not present

## 2019-02-17 DIAGNOSIS — E119 Type 2 diabetes mellitus without complications: Secondary | ICD-10-CM | POA: Diagnosis not present

## 2019-02-17 DIAGNOSIS — B351 Tinea unguium: Secondary | ICD-10-CM | POA: Diagnosis not present

## 2019-02-17 NOTE — Progress Notes (Signed)
This patient presents to the office with chief complaint of long thick nails and diabetic feet.  This patient  says there  is  no pain and discomfort in her  feet.  This patient says there are long thick painful nails and are curling under her toes..  These nails are painful walking and wearing shoes.  Patient has no history of infection or drainage from both feet.  Patient is unable to  self treat his own nails . This patient presents  to the office today for treatment of the  long nails and a foot evaluation due to history of  Diabetes. She says her HBA1C is improving..  She is wearing compression socks for her leg swelling.  General Appearance  Alert, conversant and in no acute stress.  Vascular  Dorsalis pedis and posterior tibial  pulses are palpable  bilaterally.  Capillary return is within normal limits  bilaterally. Temperature is within normal limits  bilaterally.  Neurologic  Senn-Weinstein monofilament wire test within normal limits  bilaterally. Muscle power within normal limits bilaterally.  Nails Thick disfigured discolored nails with subungual debris  from hallux to fifth toes bilaterally. No evidence of bacterial infection or drainage bilaterally.  Orthopedic  No limitations of motion of motion feet .  No crepitus or effusions noted.  No bony pathology or digital deformities noted.  HAV right foot.  Hammer toes second  B/L.  Skin  normotropic skin with no porokeratosis noted bilaterally.  No signs of infections or ulcers noted.     Onychomycosis  Diabetes with no foot complications  IE  Debride nails x 10.  A diabetic foot exam was performed and there is no evidence of any vascular or neurologic pathology.   RTC 3 months.   Gardiner Barefoot DPM

## 2019-02-18 ENCOUNTER — Inpatient Hospital Stay: Payer: Medicare Other

## 2019-02-18 ENCOUNTER — Other Ambulatory Visit: Payer: Self-pay

## 2019-02-18 DIAGNOSIS — C50912 Malignant neoplasm of unspecified site of left female breast: Secondary | ICD-10-CM | POA: Diagnosis not present

## 2019-02-18 DIAGNOSIS — D72810 Lymphocytopenia: Secondary | ICD-10-CM

## 2019-02-18 LAB — TECHNOLOGIST SMEAR REVIEW: Plt Morphology: ADEQUATE

## 2019-02-18 LAB — CBC WITH DIFFERENTIAL/PLATELET
Abs Immature Granulocytes: 0.01 10*3/uL (ref 0.00–0.07)
Basophils Absolute: 0 10*3/uL (ref 0.0–0.1)
Basophils Relative: 1 %
Eosinophils Absolute: 0 10*3/uL (ref 0.0–0.5)
Eosinophils Relative: 1 %
HCT: 30.9 % — ABNORMAL LOW (ref 36.0–46.0)
Hemoglobin: 10.1 g/dL — ABNORMAL LOW (ref 12.0–15.0)
Immature Granulocytes: 1 %
Lymphocytes Relative: 15 %
Lymphs Abs: 0.3 10*3/uL — ABNORMAL LOW (ref 0.7–4.0)
MCH: 25.3 pg — ABNORMAL LOW (ref 26.0–34.0)
MCHC: 32.7 g/dL (ref 30.0–36.0)
MCV: 77.3 fL — ABNORMAL LOW (ref 80.0–100.0)
Monocytes Absolute: 0.3 10*3/uL (ref 0.1–1.0)
Monocytes Relative: 12 %
Neutro Abs: 1.5 10*3/uL — ABNORMAL LOW (ref 1.7–7.7)
Neutrophils Relative %: 70 %
Platelets: 234 10*3/uL (ref 150–400)
RBC: 4 MIL/uL (ref 3.87–5.11)
RDW: 14.6 % (ref 11.5–15.5)
WBC: 2.1 10*3/uL — ABNORMAL LOW (ref 4.0–10.5)
nRBC: 0 % (ref 0.0–0.2)

## 2019-02-18 LAB — FOLATE: Folate: 36 ng/mL (ref >5.9)

## 2019-02-18 LAB — VITAMIN B12: Vitamin B-12: 481 pg/mL (ref 180–914)

## 2019-02-22 ENCOUNTER — Other Ambulatory Visit: Payer: Medicare Other

## 2019-02-22 LAB — COMP PANEL: LEUKEMIA/LYMPHOMA

## 2019-02-24 ENCOUNTER — Encounter: Payer: Self-pay | Admitting: Oncology

## 2019-02-24 ENCOUNTER — Telehealth: Payer: Self-pay | Admitting: *Deleted

## 2019-02-24 ENCOUNTER — Other Ambulatory Visit: Payer: Self-pay | Admitting: Oncology

## 2019-02-24 DIAGNOSIS — D72810 Lymphocytopenia: Secondary | ICD-10-CM

## 2019-02-24 DIAGNOSIS — Z853 Personal history of malignant neoplasm of breast: Secondary | ICD-10-CM

## 2019-02-24 DIAGNOSIS — D649 Anemia, unspecified: Secondary | ICD-10-CM

## 2019-02-24 DIAGNOSIS — M898X9 Other specified disorders of bone, unspecified site: Secondary | ICD-10-CM

## 2019-02-24 NOTE — Telephone Encounter (Signed)
Patient called wanting results of labs  CBC with Differential/Platelet Order: [id number] Status:  Final result Visible to patient:  No (not released) Next appt:  03/29/2019 at 02:00 PM in Radiology [facility] MM DIAGNOSTIC) Dx:  Lymphopenia  Ref Range & Units 6d ago 10d ago 6moago  WBC 4.0 - 10.5 K/uL 2.1Low   2.6Low   3.8Low    RBC 3.87 - 5.11 MIL/uL 4.00  4.12  4.53   Hemoglobin 12.0 - 15.0 g/dL 10.1Low   10.4Low   11.4Low    Comment: Reticulocyte Hemoglobin testing  may be clinically indicated,  consider ordering this additional  test [id number]  HCT 36.0 - 46.0 % 30.9Low   31.4Low   34.2Low    MCV 80.0 - 100.0 fL 77.3Low   76.2Low   75.5Low    MCH 26.0 - 34.0 pg 25.3Low   25.2Low   25.2Low    MCHC 30.0 - 36.0 g/dL 32.7  33.1  33.3   RDW 11.5 - 15.5 % 14.6  14.6  14.6   Platelets 150 - 400 K/uL 234  221  272   nRBC 0.0 - 0.2 % 0.0  0.0  0.0   Neutrophils Relative % % 70  73  70   Neutro Abs 1.7 - 7.7 K/uL 1.5Low   1.9  2.7   Lymphocytes Relative % '15  15  14   '$ Lymphs Abs 0.7 - 4.0 K/uL 0.3Low   0.4Low   0.5Low    Monocytes Relative % '12  11  14   '$ Monocytes Absolute 0.1 - 1.0 K/uL 0.3  0.3  0.5   Eosinophils Relative % '1  1  1   '$ Eosinophils Absolute 0.0 - 0.5 K/uL 0.0  0.0  0.0   Basophils Relative % 1  0  1   Basophils Absolute 0.0 - 0.1 K/uL 0.0  0.0  0.0   Immature Granulocytes % 1  0  0   Abs Immature Granulocytes 0.00 - 0.07 K/uL 0.01  0.01 CM  0.01 CM   Comment: [facility] [address], [place] [id number] Resulting Agency  [facility]      Specimen Collected: 02/18/19 10:55 Last Resulted: 02/18/19 11:14     Lab Flowsheet   Order Details   View Encounter   Lab and Collection Details   Routing   Result History     CM=Additional comments      Other Results from 02/18/2019  Technologist smear review Order: [id number] Status:  Final result Visible to patient:  No (not released) Next appt:   03/29/2019 at 02:00 PM in Radiology ([facility]) Dx:  Lymphopenia Component 6d ago  WBC Morphology UNREMARKABLE   RBC Morphology UNREMARKABLE   Tech Review PLATELETS APPEAR ADEQUATE   Comment: PLATELETS VARY IN SIZE WITH LARGE BODY AND [place] [facility]formed [facility] [address], [place] [address]  Resulting Agency [facility]      Specimen Collected: 02/18/19 10:56 Last Resulted: 02/18/19 14:36     Lab Flowsheet   Order Details   View Encounter   Lab and Collection Details   Routing   Result History           Flow cytometry panel-leukemia/lymphoma work-up Order: [id number] Status:  Edited Result - FINAL Visible to patient:  No (not released) Next appt:  03/29/2019 at 02:00 PM [facility] Dx:  Lymphopenia Component 6d ago  PATH INTERP XXX-IMP Comment   Comment: No significant immunophenotypic abnormality detected, see comment  ANNOTATION COMMENT IMP Comment VC   Comment: Recommend clinical correlation and follow up as appropriate.  CLINICAL INFO Comment VC   Comment: (NOTE)  A recent CBC was not available for review at the time this report was  prepared.   Specimen Type Comment   Comment: Peripheral blood  ASSESSMENT OF LEUKOCYTES Comment   Comment: (NOTE)  No monoclonal B cell population is detected.  There is no loss of, or aberrant expression of, the pan T cell  antigens to  suggest a neoplastic T cell process. An increased CD4/T helper to  CD8/T  suppressor cell ratio is detected. CD4:CD8 ratio 9.0  No circulating blasts are detected.  There is no immunophenotypic evidence of abnormal myeloid maturation.   % Viable Cells Comment VC   Comment: (NOTE)  68%  Cell viability in this sample is sufficient for analysis but is less  than  optimal.   ANALYSIS AND GATING STRATEGY Comment   Comment: 8 color analysis with CD45/SSC  IMMUNOPHENOTYPING STUDY Comment   Comment: (NOTE)  CD2     Normal     CD3    Normal  CD4    Normal     CD5    Normal  CD7    Normal     CD8    Normal  CD10   Normal     CD11b   Normal  CD13   Normal     CD14   Normal  CD16   Normal     CD19   Normal  CD20   Normal     CD33   Normal  CD34   Normal     CD38   Normal  CD45   Normal     CD56   Normal  CD57   Normal     CD117   Normal  HLA-DR  Normal     KAPPA   Normal  LAMBDA  Normal     CD64   Normal   PATHOLOGIST NAME Comment   Comment: Lovett Sox, M.D.  COMMENT: Comment VC   Comment: (NOTE)  Each antibody in this assay was utilized to assess for potential  abnormalities of studied cell populations or to characterize  identified abnormalities.  This test was developed and its performance characteristics  determined by [facility]. It has not been cleared or approved by the  U.S. Food and Drug Administration.  The FDA has determined that such clearance or approval is not  necessary. This test is used for clinical purposes. It should not  be regarded as investigational or for research.  Performed At: -[facility]  [address] [place], [place] [id number]  Katina Degree MDPhD NI:[id number]  Performed At: [facility]  [address] [place], [place] [id number]  Katina Degree MDPhD QQ:[id number]   Resulting Agency [facility] CLIN LAB      Specimen Collected: 02/18/19 10:55 Last Resulted: 02/22/19 17:36     Lab Flowsheet   Order Details   View Encounter   Lab and Collection Details   Routing   Result History     VC=Value has a corrected status         Folate Order: [id number]  Status:  Final result Visible to patient:  No (not released) Next appt:  03/29/2019 at 02:00 PM in Radiology [facility] MM DIAGNOSTIC) Dx:  Lymphopenia  Ref Range & Units 6d ago  Folate >5.9 ng/mL 36.0   Comment: Performed at [facility], [address] [place], [place]  [address]  Resulting Agency  [facility] CLIN LAB      Specimen Collected: 02/18/19 10:55 Last Resulted: 02/18/19 14:07     Lab Flowsheet   Order Details   View Encounter   Lab and Collection Details   Routing   Result History           Vitamin B12 Order: [id number]  Status:  Final result Visible to patient:  No (not released) Next appt:  03/29/2019 at 02:00 PM in Radiology [facility] MM DIAGNOSTIC) Dx:  Lymphopenia  Ref Range & Units 6d ago  Vitamin B-12 180 - 914 pg/mL 481   Comment: (NOTE)  This assay is not validated for testing neonatal or  myeloproliferative syndrome specimens for Vitamin B12 levels.  Performed at [facility], [place] [address], [place], [place]  [address]   Resulting Agency  [facility] CL[facility] LAB      Specimen Collected: 02/18/19 10:55 Last Resulted: 02/18/19 16:30

## 2019-02-24 NOTE — Telephone Encounter (Signed)
Lab results are communicated with patient via phone. She also reports worsening of bone pain. Please arrange patient to repeat blood work tomorrow.-Labs are ordered. Please arrange patient to have bone scan-ordered. Follow-up to be determined.

## 2019-02-24 NOTE — Telephone Encounter (Signed)
LC, please work on getting these scheduled.

## 2019-02-25 ENCOUNTER — Inpatient Hospital Stay: Payer: Medicare Other

## 2019-02-25 ENCOUNTER — Other Ambulatory Visit: Payer: Self-pay

## 2019-02-25 DIAGNOSIS — Z853 Personal history of malignant neoplasm of breast: Secondary | ICD-10-CM

## 2019-02-25 DIAGNOSIS — M898X9 Other specified disorders of bone, unspecified site: Secondary | ICD-10-CM

## 2019-02-25 DIAGNOSIS — C50912 Malignant neoplasm of unspecified site of left female breast: Secondary | ICD-10-CM | POA: Diagnosis not present

## 2019-02-25 DIAGNOSIS — D72810 Lymphocytopenia: Secondary | ICD-10-CM

## 2019-02-25 DIAGNOSIS — D649 Anemia, unspecified: Secondary | ICD-10-CM

## 2019-02-25 LAB — CBC WITH DIFFERENTIAL/PLATELET
Abs Immature Granulocytes: 0.01 10*3/uL (ref 0.00–0.07)
Basophils Absolute: 0 10*3/uL (ref 0.0–0.1)
Basophils Relative: 1 %
Eosinophils Absolute: 0 10*3/uL (ref 0.0–0.5)
Eosinophils Relative: 1 %
HCT: 30.5 % — ABNORMAL LOW (ref 36.0–46.0)
Hemoglobin: 10 g/dL — ABNORMAL LOW (ref 12.0–15.0)
Immature Granulocytes: 0 %
Lymphocytes Relative: 11 %
Lymphs Abs: 0.3 10*3/uL — ABNORMAL LOW (ref 0.7–4.0)
MCH: 25 pg — ABNORMAL LOW (ref 26.0–34.0)
MCHC: 32.8 g/dL (ref 30.0–36.0)
MCV: 76.3 fL — ABNORMAL LOW (ref 80.0–100.0)
Monocytes Absolute: 0.3 10*3/uL (ref 0.1–1.0)
Monocytes Relative: 9 %
Neutro Abs: 2.3 10*3/uL (ref 1.7–7.7)
Neutrophils Relative %: 78 %
Platelets: 234 10*3/uL (ref 150–400)
RBC: 4 MIL/uL (ref 3.87–5.11)
RDW: 14.6 % (ref 11.5–15.5)
WBC: 2.9 10*3/uL — ABNORMAL LOW (ref 4.0–10.5)
nRBC: 0 % (ref 0.0–0.2)

## 2019-02-25 LAB — IRON AND TIBC
Iron: 46 ug/dL (ref 28–170)
Saturation Ratios: 18 % (ref 10.4–31.8)
TIBC: 257 ug/dL (ref 250–450)
UIBC: 211 ug/dL

## 2019-02-25 LAB — FERRITIN: Ferritin: 60 ng/mL (ref 11–307)

## 2019-02-25 LAB — TSH: TSH: 1.896 u[IU]/mL (ref 0.350–4.500)

## 2019-02-26 LAB — ANA W/REFLEX: Anti Nuclear Antibody (ANA): NEGATIVE

## 2019-02-26 LAB — CANCER ANTIGEN 27.29: CA 27.29: 14.3 U/mL (ref 0.0–38.6)

## 2019-02-26 LAB — CANCER ANTIGEN 15-3: CA 15-3: 12.5 U/mL (ref 0.0–25.0)

## 2019-02-28 LAB — MULTIPLE MYELOMA PANEL, SERUM
Albumin SerPl Elph-Mcnc: 3.5 g/dL (ref 2.9–4.4)
Albumin/Glob SerPl: 1.1 (ref 0.7–1.7)
Alpha 1: 0.2 g/dL (ref 0.0–0.4)
Alpha2 Glob SerPl Elph-Mcnc: 0.9 g/dL (ref 0.4–1.0)
B-Globulin SerPl Elph-Mcnc: 0.8 g/dL (ref 0.7–1.3)
Gamma Glob SerPl Elph-Mcnc: 1.5 g/dL (ref 0.4–1.8)
Globulin, Total: 3.5 g/dL (ref 2.2–3.9)
IgA: 287 mg/dL (ref 64–422)
IgG (Immunoglobin G), Serum: 1468 mg/dL (ref 586–1602)
IgM (Immunoglobulin M), Srm: 333 mg/dL — ABNORMAL HIGH (ref 26–217)
Total Protein ELP: 7 g/dL (ref 6.0–8.5)

## 2019-02-28 LAB — HEMOGLOBINOPATHY EVALUATION
Hgb A2 Quant: 2.4 % (ref 1.8–3.2)
Hgb A: 97.6 % (ref 96.4–98.8)
Hgb C: 0 %
Hgb F Quant: 0 % (ref 0.0–2.0)
Hgb S Quant: 0 %
Hgb Variant: 0 %

## 2019-02-28 LAB — KAPPA/LAMBDA LIGHT CHAINS
Kappa free light chain: 54.1 mg/L — ABNORMAL HIGH (ref 3.3–19.4)
Kappa, lambda light chain ratio: 1.12 (ref 0.26–1.65)
Lambda free light chains: 48.1 mg/L — ABNORMAL HIGH (ref 5.7–26.3)

## 2019-03-02 LAB — METHYLMALONIC ACID, SERUM: Methylmalonic Acid, Quantitative: 60 nmol/L (ref 0–378)

## 2019-03-04 ENCOUNTER — Other Ambulatory Visit: Payer: Self-pay

## 2019-03-04 ENCOUNTER — Ambulatory Visit
Admission: RE | Admit: 2019-03-04 | Discharge: 2019-03-04 | Disposition: A | Discharge status: Home / Self Care | Payer: Medicare Other | Source: Ambulatory Visit | Attending: Oncology | Admitting: Oncology

## 2019-03-04 ENCOUNTER — Encounter
Admission: RE | Admit: 2019-03-04 | Discharge: 2019-03-04 | Disposition: A | Discharge status: Home / Self Care | Payer: Medicare Other | Source: Ambulatory Visit | Attending: Oncology | Admitting: Oncology

## 2019-03-04 ENCOUNTER — Telehealth: Payer: Self-pay

## 2019-03-04 DIAGNOSIS — Z853 Personal history of malignant neoplasm of breast: Secondary | ICD-10-CM | POA: Insufficient documentation

## 2019-03-04 DIAGNOSIS — M898X9 Other specified disorders of bone, unspecified site: Secondary | ICD-10-CM | POA: Insufficient documentation

## 2019-03-04 MED ORDER — TECHNETIUM TC 99M MEDRONATE IV KIT
20.0000 | PACK | Freq: Once | INTRAVENOUS | Status: AC | PRN
  Administered 2019-03-04: 11:00:00 21.72 via INTRAVENOUS

## 2019-03-04 NOTE — Telephone Encounter (Signed)
-----   Message from Earlie Server, MD sent at 03/03/2019 11:35 PM EST ----- Please let pt now that her blood work showed improved total white blood cell count. Other work up results indicate that these changes are reactive. I will explain details at her follow up visit.  Please schedule pt to see me after her bone scan and mammogram. Thanks.

## 2019-03-04 NOTE — Telephone Encounter (Signed)
Patient notified. Message sent to scheduling pool for appointment arrangement.

## 2019-03-09 ENCOUNTER — Telehealth: Payer: Self-pay | Admitting: *Deleted

## 2019-03-09 NOTE — Telephone Encounter (Signed)
Patient asking about results of Bone Scan  CLINICAL DATA:  Breast cancer, initial staging, bone pain, prior back surgery, having pain in shoulders, arms, hands, legs  EXAM: NUCLEAR MEDICINE WHOLE BODY BONE SCAN  TECHNIQUE: Whole body anterior and posterior images were obtained approximately 3 hours after intravenous injection of radiopharmaceutical.  RADIOPHARMACEUTICALS:  21.72 mCi Technetium-55m MDP IV  COMPARISON:  None.  Radiographic correlation: None recent; CT chest 06/20/2018  FINDINGS: Uptake at the shoulders, hands/wrists, knees, typically degenerative.  Uptake at the lower lumbar spine at L2-L3, L4, and L5, potentially degenerative or related to prior lumbar spine surgery, recommend radiographic correlation.  No other worrisome sites of osseous tracer accumulation are seen to suggest osseous metastases.  Diffuse uptake of tracer within the LEFT breast which may be related to LEFT breast surgery.  IMPRESSION: Uptake in the lower lumbar spine, favor either degenerative or related to prior lumbar spine surgery, recommend correlation with radiographs.  No other worrisome scintigraphic findings are seen to suggest osseous metastatic disease.   Electronically Signed   By: Lavonia Dana M.D.   On: 03/04/2019 18:26

## 2019-03-09 NOTE — Telephone Encounter (Signed)
Ok to release results. Thank you.

## 2019-03-09 NOTE — Telephone Encounter (Signed)
Patient informed ok to take her Prednisone

## 2019-03-09 NOTE — Telephone Encounter (Signed)
I dont have ability to release results. Sorry

## 2019-03-09 NOTE — Telephone Encounter (Signed)
Please let her know that bone scan did not show metastatic disease thanks

## 2019-03-09 NOTE — Telephone Encounter (Signed)
Yes

## 2019-03-09 NOTE — Telephone Encounter (Signed)
Patient called reporting that her CP ordered Prednisone for her for an allergic reaction and she is asking if it is alright to take this with the medications we have her on. She requests a call back ASAP as she is itching severely. Please advise

## 2019-03-10 NOTE — Telephone Encounter (Signed)
Call returned to patient and advised of no metastatic disease was seen in her bone scan

## 2019-03-29 ENCOUNTER — Ambulatory Visit
Admission: RE | Admit: 2019-03-29 | Discharge: 2019-03-29 | Disposition: A | Discharge status: Home / Self Care | Payer: Medicare Other | Source: Ambulatory Visit | Attending: Oncology | Admitting: Oncology

## 2019-03-29 ENCOUNTER — Other Ambulatory Visit: Payer: Self-pay | Admitting: Oncology

## 2019-03-29 DIAGNOSIS — Z853 Personal history of malignant neoplasm of breast: Secondary | ICD-10-CM

## 2019-03-30 ENCOUNTER — Other Ambulatory Visit: Payer: Self-pay | Admitting: Oncology

## 2019-03-30 DIAGNOSIS — R928 Other abnormal and inconclusive findings on diagnostic imaging of breast: Secondary | ICD-10-CM

## 2019-03-30 NOTE — Progress Notes (Signed)
Patient pre screened for office appointment, no questions or concerns today. Patient reminded of upcoming appointment time and date. 

## 2019-03-31 ENCOUNTER — Inpatient Hospital Stay: Payer: Medicare Other | Attending: Oncology | Admitting: Oncology

## 2019-03-31 DIAGNOSIS — R600 Localized edema: Secondary | ICD-10-CM | POA: Diagnosis not present

## 2019-03-31 DIAGNOSIS — M8580 Other specified disorders of bone density and structure, unspecified site: Secondary | ICD-10-CM | POA: Diagnosis not present

## 2019-03-31 DIAGNOSIS — Z803 Family history of malignant neoplasm of breast: Secondary | ICD-10-CM | POA: Insufficient documentation

## 2019-03-31 DIAGNOSIS — K219 Gastro-esophageal reflux disease without esophagitis: Secondary | ICD-10-CM | POA: Insufficient documentation

## 2019-03-31 DIAGNOSIS — Z7982 Long term (current) use of aspirin: Secondary | ICD-10-CM | POA: Insufficient documentation

## 2019-03-31 DIAGNOSIS — Z170 Estrogen receptor positive status [ER+]: Secondary | ICD-10-CM | POA: Insufficient documentation

## 2019-03-31 DIAGNOSIS — Z9223 Personal history of estrogen therapy: Secondary | ICD-10-CM | POA: Insufficient documentation

## 2019-03-31 DIAGNOSIS — Z7984 Long term (current) use of oral hypoglycemic drugs: Secondary | ICD-10-CM | POA: Insufficient documentation

## 2019-03-31 DIAGNOSIS — D72810 Lymphocytopenia: Secondary | ICD-10-CM

## 2019-03-31 DIAGNOSIS — E7800 Pure hypercholesterolemia, unspecified: Secondary | ICD-10-CM | POA: Insufficient documentation

## 2019-03-31 DIAGNOSIS — N6320 Unspecified lump in the left breast, unspecified quadrant: Secondary | ICD-10-CM | POA: Insufficient documentation

## 2019-03-31 DIAGNOSIS — Z853 Personal history of malignant neoplasm of breast: Secondary | ICD-10-CM | POA: Insufficient documentation

## 2019-03-31 DIAGNOSIS — E119 Type 2 diabetes mellitus without complications: Secondary | ICD-10-CM | POA: Insufficient documentation

## 2019-03-31 DIAGNOSIS — D509 Iron deficiency anemia, unspecified: Secondary | ICD-10-CM | POA: Insufficient documentation

## 2019-03-31 DIAGNOSIS — Z79899 Other long term (current) drug therapy: Secondary | ICD-10-CM | POA: Insufficient documentation

## 2019-03-31 DIAGNOSIS — Z791 Long term (current) use of non-steroidal anti-inflammatories (NSAID): Secondary | ICD-10-CM | POA: Insufficient documentation

## 2019-03-31 DIAGNOSIS — I10 Essential (primary) hypertension: Secondary | ICD-10-CM | POA: Insufficient documentation

## 2019-03-31 DIAGNOSIS — R5382 Chronic fatigue, unspecified: Secondary | ICD-10-CM | POA: Insufficient documentation

## 2019-04-01 ENCOUNTER — Encounter: Payer: Self-pay | Admitting: Oncology

## 2019-04-01 ENCOUNTER — Other Ambulatory Visit: Payer: Self-pay

## 2019-04-01 MED ORDER — ANASTROZOLE 1 MG PO TABS: 1.0000 mg | ORAL_TABLET | Freq: Every day | ORAL | 1 refills | Status: DC

## 2019-04-01 NOTE — Telephone Encounter (Signed)
Request has been sent to Rx Request for Dr. Tasia Catchings to review.

## 2019-04-02 ENCOUNTER — Encounter: Payer: Self-pay | Admitting: Oncology

## 2019-04-02 NOTE — Progress Notes (Signed)
HEMATOLOGY-ONCOLOGY TeleHEALTH VISIT PROGRESS NOTE  I connected with Jackie Allison on 04/02/19 at 10:00 AM EST by video enabled telemedicine visit and verified that I am speaking with the correct person using two identifiers. I discussed the limitations, risks, security and privacy concerns of performing an evaluation and management service by telemedicine and the availability of in-person appointments. I also discussed with the patient that there may be a patient responsible charge related to this service. The patient expressed understanding and agreed to proceed.   Other persons participating in the visit and their role in the encounter:  None  Patient's location: Home  Provider's location: office Chief Complaint: follow up for breast cancer, leukopenia   INTERVAL HISTORY Jackie Allison is a 78 y.o. female who has above history reviewed by me today presents for follow up visit for management of stage IIA breast cancer. Problems and complaints are listed below:  She has been on Arimidex 1 mg daily. Tolerating well.  She had additional work up for leukopenia.  Chronic fatigue. She reports increased lower extremity edema, chronic problem, recently get worse.  Denies fever, chills, nausea, vomiting, diarrhea, chest pain, shortness of breath, abdominal pain, urinary symptoms, lower extremity swelling.   Review of Systems  Constitutional: Negative for appetite change, chills, fatigue and fever.  HENT:   Negative for hearing loss and voice change.   Eyes: Negative for eye problems.  Respiratory: Negative for chest tightness and cough.   Cardiovascular: Positive for leg swelling. Negative for chest pain.  Gastrointestinal: Negative for abdominal distention, abdominal pain and blood in stool.  Endocrine: Negative for hot flashes.  Genitourinary: Negative for difficulty urinating and frequency.   Musculoskeletal: Positive for arthralgias.  Skin: Negative for itching and rash.  Neurological:  Negative for extremity weakness.  Hematological: Negative for adenopathy.  Psychiatric/Behavioral: Negative for confusion.    Past Medical History:  Diagnosis Date  . Anemia    chronic microcytic anemia  . Breast abscess 06/20/2018  . Breast cancer ([place]) 03/2018   Invasive Mammary  . Cancer ([place]) 2019   left breast  . Diabetes mellitus without complication ([place])   . GERD (gastroesophageal reflux disease)   . Glaucoma   . Hypercholesterolemia   . Hypertension    Past Surgical History:  Procedure Laterality Date  . BACK SURGERY    . BREAST BIOPSY Left 02/2018  . BREAST EXCISIONAL BIOPSY Right ??  . BREAST LUMPECTOMY Left 04/03/2018   Invasive Mammary CA Neg margins  . CATARACT EXTRACTION W/ INTRAOCULAR LENS  IMPLANT, BILATERAL    . EYE SURGERY Bilateral    cataract extraction  . INCISION AND DRAINAGE ABSCESS Left 04/28/2018   Procedure: INCISION AND DRAINAGE ABSCESS;  Surgeon: Benjamine Sprague, DO;  Location: [facility] ORS;  Service: General;  Laterality: Left;  . INCISION AND DRAINAGE ABSCESS Left 06/20/2018   Procedure: INCISION AND DRAINAGE ABSCESS;  Surgeon: Herbert Pun, MD;  Location: [facility] ORS;  Service: General;  Laterality: Left;  . LUMBAR FUSION  2002?  . LUMBAR LAMINECTOMY  1997   titanium in back  . PARTIAL MASTECTOMY WITH AXILLARY SENTINEL LYMPH NODE BIOPSY Left 04/13/2018   Procedure: PARTIAL MASTECTOMY WITH AXILLARY SENTINEL LYMPH NODE BIOPSY  (No Needle Loc);  Surgeon: Benjamine Sprague, DO;  Location: [facility] ORS;  Service: General;  Laterality: Left;  . RETINAL DETACHMENT SURGERY Bilateral 2009  . TUBAL LIGATION      Family History  Problem Relation Age of Onset  . Diabetes Sister   . Diabetes Brother   .  Diabetes Sister   . Breast cancer Other   . Hypertension Mother   . Glaucoma Mother   . Heart attack Father     Social History   Socioeconomic History  . Marital status: Married    Spouse name: Jackie Allison  . Number of children: Not on file  . Years of  education: Not on file  . Highest education level: Not on file  Occupational History  . Occupation: Restaurant manager, fast food estate  Social Needs  . Financial resource strain: Not on file  . Food insecurity    Worry: Not on file    Inability: Not on file  . Transportation needs    Medical: Not on file    Non-medical: Not on file  Tobacco Use  . Smoking status: Never Smoker  . Smokeless tobacco: Never Used  Substance and Sexual Activity  . Alcohol use: Never    Frequency: Never  . Drug use: Never  . Sexual activity: Not on file  Lifestyle  . Physical activity    Days per week: Not on file    Minutes per session: Not on file  . Stress: Not on file  Relationships  . Social Herbalist on phone: Not on file    Gets together: Not on file    Attends religious service: Not on file    Active member of club or organization: Not on file    Attends meetings of clubs or organizations: Not on file    Relationship status: Not on file  . Intimate partner violence    Fear of current or ex partner: Not on file    Emotionally abused: Not on file    Physically abused: Not on file    Forced sexual activity: Not on file  Other Topics Concern  . Not on file  Social History Narrative  . Not on file    Current Outpatient Medications on File Prior to Visit  Medication Sig Dispense Refill  . acetaminophen (TYLENOL) 500 MG tablet Take 500 mg by mouth every 6 (six) hours as needed for moderate pain.     Marland Kitchen aspirin 81 MG EC tablet Take 81 mg by mouth daily.     Marland Kitchen atorvastatin (LIPITOR) 10 MG tablet Take 10 mg by mouth every evening.     . benazepril (LOTENSIN) 40 MG tablet Take 40 mg by mouth daily.     . brimonidine (ALPHAGAN) 0.2 % ophthalmic solution Place 1 drop into the right eye 2 (two) times daily.     . carvedilol (COREG) 12.5 MG tablet Take 12.5 mg by mouth 2 (two) times daily with a meal.     . chlorhexidine (PERIDEX) 0.12 % solution Use as directed 15 mLs in the mouth or throat 2 (two)  times daily.   99  . Coenzyme Q10 (COQ10 PO) Take 1 tablet by mouth daily.    Marland Kitchen docusate sodium (COLACE) 100 MG capsule Take 100 mg by mouth daily as needed for mild constipation.    . dorzolamide-timolol (COSOPT) 22.3-6.8 MG/ML ophthalmic solution Place 1 drop into both eyes 2 (two) times daily.    . ferrous sulfate 325 (65 FE) MG EC tablet TAKE 1 TABLET (325 MG TOTAL) BY MOUTH 2 (TWO) TIMES DAILY WITH A MEAL. 60 tablet 1  . hydrALAZINE (APRESOLINE) 100 MG tablet Take 100 mg by mouth 3 (three) times daily.   3  . ibuprofen (ADVIL,MOTRIN) 800 MG tablet Take 1 tablet (800 mg total) by mouth every 8 (eight) hours  as needed for mild pain or moderate pain. 30 tablet 0  . latanoprost (XALATAN) 0.005 % ophthalmic solution Place 1 drop into both eyes at bedtime.     . lidocaine-prilocaine (EMLA) cream Apply to affected area once 30 g 3  . metFORMIN (GLUCOPHAGE) 500 MG tablet Take 500 mg by mouth daily with breakfast.     . Multiple Vitamin (MULTI-VITAMINS) TABS Take 1 tablet by mouth daily.     Marland Kitchen omeprazole (PRILOSEC) 40 MG capsule Take 40 mg by mouth daily.     Marland Kitchen Specialty Vitamins Products (MAGNESIUM, AMINO ACID CHELATE,) 133 MG tablet Take 1 tablet by mouth daily.    Marland Kitchen trolamine salicylate (ASPERCREME) 10 % cream Apply 1 application topically as needed for muscle pain.     No current facility-administered medications on file prior to visit.     Allergies  Allergen Reactions  . Shellfish Allergy Anaphylaxis  . Iodine     Patient unsure if allergic to topical betadine Shellfish allergy with ingestion  . Sulfa Antibiotics Itching       Observations/Objective: There were no vitals filed for this visit. There is no height or weight on file to calculate BMI.  Physical Exam  Constitutional: She is oriented to person, place, and time. No distress.  Neurological: She is alert and oriented to person, place, and time.    CBC    Component Value Date/Time   WBC 2.9 (L) 02/25/2019 1055   RBC  4.00 02/25/2019 1055   HGB 10.0 (L) 02/25/2019 1055   HCT 30.5 (L) 02/25/2019 1055   PLT 234 02/25/2019 1055   MCV 76.3 (L) 02/25/2019 1055   MCH 25.0 (L) 02/25/2019 1055   MCHC 32.8 02/25/2019 1055   RDW 14.6 02/25/2019 1055   LYMPHSABS 0.3 (L) 02/25/2019 1055   MONOABS 0.3 02/25/2019 1055   EOSABS 0.0 02/25/2019 1055   BASOSABS 0.0 02/25/2019 1055    CMP     Component Value Date/Time   NA 127 (L) 02/14/2019 1320   K 3.6 02/14/2019 1320   CL 97 (L) 02/14/2019 1320   CO2 20 (L) 02/14/2019 1320   GLUCOSE 123 (H) 02/14/2019 1320   BUN 14 02/14/2019 1320   CREATININE 0.94 02/14/2019 1320   CALCIUM 9.0 02/14/2019 1320   PROT 7.4 02/14/2019 1320   ALBUMIN 3.8 02/14/2019 1320   AST 23 02/14/2019 1320   ALT 19 02/14/2019 1320   ALKPHOS 51 02/14/2019 1320   BILITOT 0.5 02/14/2019 1320   GFRNONAA 57 (L) 02/14/2019 1320   GFRAA >60 02/14/2019 1320    RADIOGRAPHIC STUDIES: I have personally reviewed the radiological images as listed and agreed with the findings in the report. Nm Bone Scan Whole Body  Result Date: 03/04/2019 CLINICAL DATA:  Breast cancer, initial staging, bone pain, prior back surgery, having pain in shoulders, arms, hands, legs EXAM: NUCLEAR MEDICINE WHOLE BODY BONE SCAN TECHNIQUE: Whole body anterior and posterior images were obtained approximately 3 hours after intravenous injection of radiopharmaceutical. RADIOPHARMACEUTICALS:  21.72 mCi Technetium-68mMDP IV COMPARISON:  None. Radiographic correlation: None recent; CT chest 06/20/2018 FINDINGS: Uptake at the shoulders, hands/wrists, knees, typically degenerative. Uptake at the lower lumbar spine at L2-L3, L4, and L5, potentially degenerative or related to prior lumbar spine surgery, recommend radiographic correlation. No other worrisome sites of osseous tracer accumulation are seen to suggest osseous metastases. Diffuse uptake of tracer within the LEFT breast which may be related to LEFT breast surgery. IMPRESSION:  Uptake in the lower lumbar spine, favor  either degenerative or related to prior lumbar spine surgery, recommend correlation with radiographs. No other worrisome scintigraphic findings are seen to suggest osseous metastatic disease. Electronically Signed   By: Lavonia Dana M.D.   On: 03/04/2019 18:26   US Breast Ltd Uni Right Inc Axilla  Result Date: 03/29/2019 CLINICAL DATA:  78 year old female with history of left breast cancer post lumpectomy 04/03/2018. EXAM: DIGITAL DIAGNOSTIC BILATERAL MAMMOGRAM WITH CAD AND TOMO ULTRASOUND RIGHT AXILLA COMPARISON:  Previous exam(s). ACR Breast Density Category b: There are scattered areas of fibroglandular density. FINDINGS: The lymph nodes in the outer right breast as well as right axilla have increased in size and with increased cortical thickening when compared to the prior exams. New lumpectomy changes are identified within the far lower posterior left breast. Spot compression magnification CC view of the left breast lumpectomy site was performed. There is no mammographic evidence of locally recurrent malignancy. Mammographic images were processed with CAD. Targeted ultrasound of the right axilla was performed. At least 3 lymph nodes are identified with thickened cortices corresponding with mammography findings. IMPRESSION: 1. Increased size of lymph nodes within the outer right breast and right axilla which could be reactive however malignancy not excluded. 2. New lumpectomy site left breast with no mammographic evidence of malignancy in the left breast. RECOMMENDATION: Ultrasound-guided biopsy of 1 of the morphologically abnormal lymph nodes in the right axilla is recommended. This will be scheduled for the patient. I have discussed the findings and recommendations with the patient. If applicable, a reminder letter will be sent to the patient regarding the next appointment. BI-RADS CATEGORY  4: Suspicious. Electronically Signed   By: Everlean Alstrom M.D.   On:  03/29/2019 15:16   Mm Diag Breast Tomo Bilateral  Result Date: 03/29/2019 CLINICAL DATA:  78 year old female with history of left breast cancer post lumpectomy 04/03/2018. EXAM: DIGITAL DIAGNOSTIC BILATERAL MAMMOGRAM WITH CAD AND TOMO ULTRASOUND RIGHT AXILLA COMPARISON:  Previous exam(s). ACR Breast Density Category b: There are scattered areas of fibroglandular density. FINDINGS: The lymph nodes in the outer right breast as well as right axilla have increased in size and with increased cortical thickening when compared to the prior exams. New lumpectomy changes are identified within the far lower posterior left breast. Spot compression magnification CC view of the left breast lumpectomy site was performed. There is no mammographic evidence of locally recurrent malignancy. Mammographic images were processed with CAD. Targeted ultrasound of the right axilla was performed. At least 3 lymph nodes are identified with thickened cortices corresponding with mammography findings. IMPRESSION: 1. Increased size of lymph nodes within the outer right breast and right axilla which could be reactive however malignancy not excluded. 2. New lumpectomy site left breast with no mammographic evidence of malignancy in the left breast. RECOMMENDATION: Ultrasound-guided biopsy of 1 of the morphologically abnormal lymph nodes in the right axilla is recommended. This will be scheduled for the patient. I have discussed the findings and recommendations with the patient. If applicable, a reminder letter will be sent to the patient regarding the next appointment. BI-RADS CATEGORY  4: Suspicious. Electronically Signed   By: Everlean Alstrom M.D.   On: 03/29/2019 15:16     Assessment and Plan: 1. History of breast cancer   2. Leg edema   3. Lymphopenia   4. Osteopenia, unspecified location   5. 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 Continue Arimidex.  03/29/2019 Bilateral mammogram  showed increased size  lymph nodes within the outer right breast and right axilla. Recommend ultrasound-guided biopsy of right axillary lymph nodes. 03/04/2019, bone scan negative for bone metastasis.  #Leukopenia, flow cytometry negative.  Multiple myeloma panel negative, work-up results indicate reactive process.  Continue to monitor.  #Microcytic anemia/history of iron deficiency.  normal iron panel now. Normal hemoglobinopathy evaluation.possible alpha thalassemia treait.   # Osteopenia, continue calcium and vitamin D supplements.DEXA 11/11/2018 findings revealed osteopenia.  Recommend bisphosphate treatment with Zometa Q6 months.-Next dose due in January 2021. # Leg edema, worsened. Check b/l LE US venous  Follow Up Instructions: To be determined.  I will see patient after right axillary lymph node biopsy. Discussed with breast cancer RN coordinator Vita Barley and Webb Silversmith who will coordinate her care.   I discussed the assessment and treatment plan with the patient. The patient was provided an opportunity to ask questions and all were answered. The patient agreed with the plan and demonstrated an understanding of the instructions.  The patient was advised to call back or seek an in-person evaluation if the symptoms worsen or if the condition fails to improve as anticipated.   Earlie Server, MD 04/02/2019 12:23 AM

## 2019-04-04 ENCOUNTER — Ambulatory Visit
Admission: RE | Admit: 2019-04-04 | Discharge: 2019-04-04 | Disposition: A | Discharge status: Home / Self Care | Payer: Medicare Other | Source: Ambulatory Visit | Attending: Radiation Oncology | Admitting: Radiation Oncology

## 2019-04-04 ENCOUNTER — Encounter: Payer: Self-pay | Admitting: Radiation Oncology

## 2019-04-04 ENCOUNTER — Other Ambulatory Visit: Payer: Self-pay

## 2019-04-04 VITALS — BP 181/62 | HR 63 | Temp 98.1°F | Resp 16

## 2019-04-04 DIAGNOSIS — Z170 Estrogen receptor positive status [ER+]: Secondary | ICD-10-CM | POA: Insufficient documentation

## 2019-04-04 DIAGNOSIS — Z923 Personal history of irradiation: Secondary | ICD-10-CM | POA: Insufficient documentation

## 2019-04-04 DIAGNOSIS — C50912 Malignant neoplasm of unspecified site of left female breast: Secondary | ICD-10-CM | POA: Diagnosis not present

## 2019-04-04 DIAGNOSIS — M129 Arthropathy, unspecified: Secondary | ICD-10-CM | POA: Insufficient documentation

## 2019-04-04 DIAGNOSIS — Z79811 Long term (current) use of aromatase inhibitors: Secondary | ICD-10-CM | POA: Insufficient documentation

## 2019-04-04 NOTE — Progress Notes (Signed)
Radiation Oncology Follow up Note  Name: Jackie Allison   Date:   04/04/2019 MRN:  [id number] DOB: [birth year]    This 78 y.o. female presents to the clinic today for 71-monthfollow-up status post whole breast radiation to her left breast for stage II ER positive PR negative invasive mammary carcinoma.  REFERRING PROVIDER: LDion Body MD  HPI: Patient is a 78year old female now about 6 months having completed whole breast radiation to her left breast for stage II ER positive PR negative HER-2/neu negative invasive mammary carcinoma.  Seen today in routine follow-up her major concern is increased and exacerbated arthritis..  She is currently on Arimidex which may be exacerbating some of her arthritis.  She had a mammogram this month showing some increased lymph nodes in the contralateral axilla for which she has a biopsy planned.  Left breast showed no evidence to suggest malignancy.  COMPLICATIONS OF TREATMENT: none  FOLLOW UP COMPLIANCE: keeps appointments   PHYSICAL EXAM:  BP (!) 181/62 (BP Location: Left Arm, Patient Position: Sitting)   Pulse 63   Temp 98.1 F (36.7 C) (Tympanic)   Resp 16  Lungs are clear to A&P cardiac examination essentially unremarkable with regular rate and rhythm. No dominant mass or nodularity is noted in either breast in 2 positions examined. Incision is well-healed. No axillary or supraclavicular adenopathy is appreciated. Cosmetic result is excellent.  Well-developed well-nourished patient in NAD. HEENT reveals PERLA, EOMI, discs not visualized.  Oral cavity is clear. No oral mucosal lesions are identified. Neck is clear without evidence of cervical or supraclavicular adenopathy. Lungs are clear to A&P. Cardiac examination is essentially unremarkable with regular rate and rhythm without murmur rub or thrill. Abdomen is benign with no organomegaly or masses noted. Motor sensory and DTR levels are equal and symmetric in the upper and lower extremities. Cranial  nerves II through XII are grossly intact. Proprioception is intact. No peripheral adenopathy or edema is identified. No motor or sensory levels are noted. Crude visual fields are within normal range.  RADIOLOGY RESULTS: Mammograms reviewed compatible with above-stated findings  PLAN: Present time patient is doing well have asked her to consult with medical oncology about switching her antiestrogen therapy secondary to her exacerbation of arthritis side effect.  We will also review her pathology when it becomes available on her right axillary biopsy.  Otherwise have asked to see her back in 1 year for follow-up.  Patient knows to call with any concerns.  I would like to take this opportunity to thank you for allowing me to participate in the care of your patient..Noreene Filbert MD

## 2019-04-06 ENCOUNTER — Inpatient Hospital Stay: Payer: Medicare Other | Admitting: Occupational Therapy

## 2019-04-06 ENCOUNTER — Ambulatory Visit
Admission: RE | Admit: 2019-04-06 | Discharge: 2019-04-06 | Disposition: A | Discharge status: Home / Self Care | Payer: Medicare Other | Source: Ambulatory Visit | Attending: Oncology | Admitting: Oncology

## 2019-04-06 ENCOUNTER — Telehealth: Payer: Self-pay | Admitting: *Deleted

## 2019-04-06 ENCOUNTER — Other Ambulatory Visit: Payer: Self-pay

## 2019-04-06 DIAGNOSIS — R600 Localized edema: Secondary | ICD-10-CM | POA: Diagnosis not present

## 2019-04-06 NOTE — Telephone Encounter (Signed)
Ok to hold her aromatase inhibitor for now to see if she feels better.  Please also schedule her to see me 1 week after her upcoming axillary lymph node biopsy.; thanks.

## 2019-04-06 NOTE — Telephone Encounter (Signed)
Patient called reporting that she is havinfg boney type pains and swollen feet, She states it almost feels like arthritis type pain, but she does not have arthritis. She states she is having a hard time walking and that her hands are hard to open in the mornings and her shoulders are stiff as well. She mentions that something was said about holding her medicine for a while. Please advise

## 2019-04-06 NOTE — Telephone Encounter (Signed)
Left message on patient voice mail to stop AI for now and that scheuler will be calling her for an appointment post biopsy. Message sent to scheduling

## 2019-04-07 ENCOUNTER — Ambulatory Visit
Admission: RE | Admit: 2019-04-07 | Discharge: 2019-04-07 | Disposition: A | Discharge status: Home / Self Care | Payer: Medicare Other | Source: Ambulatory Visit | Attending: Oncology | Admitting: Oncology

## 2019-04-07 DIAGNOSIS — R928 Other abnormal and inconclusive findings on diagnostic imaging of breast: Secondary | ICD-10-CM | POA: Diagnosis not present

## 2019-04-11 ENCOUNTER — Encounter: Payer: Self-pay | Admitting: Oncology

## 2019-04-11 LAB — SURGICAL PATHOLOGY

## 2019-04-12 ENCOUNTER — Other Ambulatory Visit: Payer: Self-pay

## 2019-04-12 NOTE — Progress Notes (Signed)
Patient pre screened for office appointment, no questions or concerns today. Patient reminded of upcoming appointment time and date. 

## 2019-04-13 ENCOUNTER — Emergency Department
Admission: EM | Admit: 2019-04-13 | Discharge: 2019-04-13 | Disposition: A | Discharge status: Home / Self Care | Payer: Medicare Other | Attending: Student | Admitting: Student

## 2019-04-13 ENCOUNTER — Inpatient Hospital Stay: Payer: Medicare Other | Admitting: Occupational Therapy

## 2019-04-13 ENCOUNTER — Inpatient Hospital Stay (HOSPITAL_BASED_OUTPATIENT_CLINIC_OR_DEPARTMENT_OTHER): Payer: Medicare Other | Admitting: Oncology

## 2019-04-13 ENCOUNTER — Encounter: Payer: Self-pay | Admitting: Oncology

## 2019-04-13 ENCOUNTER — Other Ambulatory Visit: Payer: Self-pay

## 2019-04-13 ENCOUNTER — Emergency Department: Payer: Medicare Other

## 2019-04-13 ENCOUNTER — Encounter: Payer: Self-pay | Admitting: Intensive Care

## 2019-04-13 VITALS — BP 209/85 | HR 68 | Temp 98.4°F | Resp 18 | Wt 158.0 lb

## 2019-04-13 DIAGNOSIS — Z8679 Personal history of other diseases of the circulatory system: Secondary | ICD-10-CM

## 2019-04-13 DIAGNOSIS — M898X9 Other specified disorders of bone, unspecified site: Secondary | ICD-10-CM

## 2019-04-13 DIAGNOSIS — K219 Gastro-esophageal reflux disease without esophagitis: Secondary | ICD-10-CM | POA: Diagnosis not present

## 2019-04-13 DIAGNOSIS — I10 Essential (primary) hypertension: Secondary | ICD-10-CM | POA: Insufficient documentation

## 2019-04-13 DIAGNOSIS — Z803 Family history of malignant neoplasm of breast: Secondary | ICD-10-CM | POA: Diagnosis not present

## 2019-04-13 DIAGNOSIS — Z170 Estrogen receptor positive status [ER+]: Secondary | ICD-10-CM | POA: Diagnosis not present

## 2019-04-13 DIAGNOSIS — R600 Localized edema: Secondary | ICD-10-CM

## 2019-04-13 DIAGNOSIS — R0789 Other chest pain: Secondary | ICD-10-CM | POA: Diagnosis not present

## 2019-04-13 DIAGNOSIS — R5382 Chronic fatigue, unspecified: Secondary | ICD-10-CM | POA: Diagnosis not present

## 2019-04-13 DIAGNOSIS — M8580 Other specified disorders of bone density and structure, unspecified site: Secondary | ICD-10-CM | POA: Diagnosis not present

## 2019-04-13 DIAGNOSIS — Z9114 Patient's other noncompliance with medication regimen: Secondary | ICD-10-CM | POA: Insufficient documentation

## 2019-04-13 DIAGNOSIS — N6320 Unspecified lump in the left breast, unspecified quadrant: Secondary | ICD-10-CM | POA: Diagnosis not present

## 2019-04-13 DIAGNOSIS — Z7982 Long term (current) use of aspirin: Secondary | ICD-10-CM | POA: Diagnosis not present

## 2019-04-13 DIAGNOSIS — Z853 Personal history of malignant neoplasm of breast: Secondary | ICD-10-CM | POA: Diagnosis present

## 2019-04-13 DIAGNOSIS — Z79899 Other long term (current) drug therapy: Secondary | ICD-10-CM | POA: Insufficient documentation

## 2019-04-13 DIAGNOSIS — D509 Iron deficiency anemia, unspecified: Secondary | ICD-10-CM | POA: Diagnosis not present

## 2019-04-13 DIAGNOSIS — D72810 Lymphocytopenia: Secondary | ICD-10-CM | POA: Diagnosis not present

## 2019-04-13 DIAGNOSIS — Z9223 Personal history of estrogen therapy: Secondary | ICD-10-CM | POA: Diagnosis not present

## 2019-04-13 DIAGNOSIS — Z7984 Long term (current) use of oral hypoglycemic drugs: Secondary | ICD-10-CM | POA: Diagnosis not present

## 2019-04-13 DIAGNOSIS — E7800 Pure hypercholesterolemia, unspecified: Secondary | ICD-10-CM | POA: Diagnosis not present

## 2019-04-13 DIAGNOSIS — Z791 Long term (current) use of non-steroidal anti-inflammatories (NSAID): Secondary | ICD-10-CM | POA: Diagnosis not present

## 2019-04-13 DIAGNOSIS — E119 Type 2 diabetes mellitus without complications: Secondary | ICD-10-CM | POA: Insufficient documentation

## 2019-04-13 LAB — CBC WITH DIFFERENTIAL/PLATELET
Abs Immature Granulocytes: 0.01 10*3/uL (ref 0.00–0.07)
Basophils Absolute: 0 10*3/uL (ref 0.0–0.1)
Basophils Relative: 1 %
Eosinophils Absolute: 0 10*3/uL (ref 0.0–0.5)
Eosinophils Relative: 1 %
HCT: 25.6 % — ABNORMAL LOW (ref 36.0–46.0)
Hemoglobin: 8.9 g/dL — ABNORMAL LOW (ref 12.0–15.0)
Immature Granulocytes: 1 %
Lymphocytes Relative: 22 %
Lymphs Abs: 0.5 10*3/uL — ABNORMAL LOW (ref 0.7–4.0)
MCH: 24.7 pg — ABNORMAL LOW (ref 26.0–34.0)
MCHC: 34.8 g/dL (ref 30.0–36.0)
MCV: 71.1 fL — ABNORMAL LOW (ref 80.0–100.0)
Monocytes Absolute: 0.2 10*3/uL (ref 0.1–1.0)
Monocytes Relative: 10 %
Neutro Abs: 1.5 10*3/uL — ABNORMAL LOW (ref 1.7–7.7)
Neutrophils Relative %: 65 %
Platelets: 183 10*3/uL (ref 150–400)
RBC: 3.6 MIL/uL — ABNORMAL LOW (ref 3.87–5.11)
RDW: 15.5 % (ref 11.5–15.5)
WBC: 2.2 10*3/uL — ABNORMAL LOW (ref 4.0–10.5)
nRBC: 0 % (ref 0.0–0.2)

## 2019-04-13 LAB — TROPONIN I (HIGH SENSITIVITY): Troponin I (High Sensitivity): 11 ng/L (ref <18)

## 2019-04-13 LAB — COMPREHENSIVE METABOLIC PANEL
ALT: 17 U/L (ref 0–44)
AST: 22 U/L (ref 15–41)
Albumin: 2.9 g/dL — ABNORMAL LOW (ref 3.5–5.0)
Alkaline Phosphatase: 58 U/L (ref 38–126)
Anion gap: 10 (ref 5–15)
BUN: 11 mg/dL (ref 8–23)
CO2: 23 mmol/L (ref 22–32)
Calcium: 9.1 mg/dL (ref 8.9–10.3)
Chloride: 101 mmol/L (ref 98–111)
Creatinine, Ser: 0.62 mg/dL (ref 0.44–1.00)
GFR calc Af Amer: >60 mL/min (ref >60)
GFR calc non Af Amer: >60 mL/min (ref >60)
Glucose, Bld: 131 mg/dL — ABNORMAL HIGH (ref 70–99)
Potassium: 3.2 mmol/L — ABNORMAL LOW (ref 3.5–5.1)
Sodium: 134 mmol/L — ABNORMAL LOW (ref 135–145)
Total Bilirubin: 1 mg/dL (ref 0.3–1.2)
Total Protein: 6.9 g/dL (ref 6.5–8.1)

## 2019-04-13 LAB — BRAIN NATRIURETIC PEPTIDE: B Natriuretic Peptide: 147 pg/mL — ABNORMAL HIGH (ref 0.0–100.0)

## 2019-04-13 MED ORDER — HYDRALAZINE HCL 50 MG PO TABS
100.0000 mg | ORAL_TABLET | Freq: Once | ORAL | Status: AC
  Administered 2019-04-13: 100 mg via ORAL
  Filled 2019-04-13: qty 2

## 2019-04-13 NOTE — Discharge Instructions (Addendum)
Please talk to your physical therapist/occupational therapist about a referral to the lymphedema clinic to help with your legs.   Please follow-up with your oncologist in the clinic.  Please take all of your medications as prescribed.  Please return to the emergency department for any new or worsening symptoms.

## 2019-04-13 NOTE — ED Triage Notes (Addendum)
Patient sent here by cancer center due to HTN. Patient also c/o bilateral swollen feet X1-48months.  Had appointment this morning for checkup. Denies vision changes. C/o chronic arthritis pain. Denies cp. Left sided breast cancer. Takes chemo pill once a day. Reports she has not had her HTN medicine this morning because she was busy getting ready to leave for her appointment

## 2019-04-13 NOTE — Progress Notes (Signed)
Hematology/Oncology follow up note Jackie Allison [phone] [phone]   Patient Care Team: Dion Body, MD as PCP - General (Family Medicine)  REASON FOR VISIT:  Follow up for management  of Stage IIA breast cancer  HISTORY OF PRESENTING ILLNESS:  Jackie Allison is a  78 y.o.  female with PMH listed below who was referred to me for evaluation of breast cancer Self palpated left breast mass for 1 month.  Patient had mammogram and ultrasound on 03/11/2018 which showed suspicious left breast mass, indeterminate left aixllary lymph node.  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  Nipple discharge: denies.  Family history: great niece had breast cancer OCP use: denies.  Estrogen and progesterone therapy: denies History of radiation to chest: denies.  Previous breast surgery: previous right breast biopsy.    #  04/13/2018 underwent left lumpectomy and sentinel lymph node biopsy on Pathology showed invasive mammary carcinoma, negative margin, sentinel lymph nodes negative 0/5,  Grade 2, ER positive, PR negative HER-2 negative[performed on previous biopsy].Lymphovascular invasion: Not identified  Patient developed left breast abscess and infection status post I&D and debridement on 04/28/2018 # 06/19/2018 wound infection. Surgical wound culture positive for MRSA, as well as enterococcus faecalis. Patient was discharged on 06/22/2018.  Oncotype DX recurrence score came back at 27, predicting distant recurrence risk at 9 years with tamoxifen alone around 16%.  Group average absolute chemotherapy benefit >15%.  Patient declined chemotherapy.   # finished adjuvant radiation-last RT 09/27/2018 #Started on adjuvant antiestrogen treatment with Arimidex 1 mg daily since June 2020.  #Osteopenic, received a dental clearance for Zometa.  Started on Zometa 4 mg every 6 months.  Received  last dose on 11/16/2018.  INTERVAL HISTORY Jackie Allison is a 78 y.o. female who has above history reviewed by me today presents for follow up visit for management of stage IIA breast cancer. Patient had been taking Arimidex 1 mg daily, due to severe bone pain, I have asked her to stop. She had been on Arimidex since June 2020, previously doing all right.  Lately have developed bone pain.  She has stopped Arimidex for about 1 week.  Bone pain has not improved.  Worsening of bilateral lower extremity swelling and tenderness. Recent bilateral lower extremity venous duplex was negative for DVT. She reports a recent history of fall, and she had developed left hip pain since then. Left lower extremity edema has also worsened. Today her blood pressure is high in the clinic.  209/85.  She denies any headache or focal weakness.  She forgets to take her blood pressure medication this morning. Review of Systems  Constitutional: Positive for fatigue. Negative for appetite change, chills and fever.  HENT:   Negative for hearing loss and voice change.   Eyes: Negative for eye problems.  Respiratory: Negative for chest tightness and cough.   Cardiovascular: Positive for leg swelling. Negative for chest pain.  Gastrointestinal: Negative for abdominal distention, abdominal pain and blood in stool.  Endocrine: Negative for hot flashes.  Genitourinary: Negative for difficulty urinating and frequency.   Musculoskeletal: Positive for arthralgias.       Chronic back pain New left hip pain  Skin: Negative for itching and rash.  Neurological: Negative for extremity weakness.  Hematological: Negative for adenopathy.  Psychiatric/Behavioral: Negative for confusion.    MEDICAL HISTORY:  Past Medical History:  Diagnosis Date  . Anemia    chronic microcytic anemia  .  Breast abscess 06/20/2018  . Breast cancer ([place]) 03/2018   Invasive Mammary  . Cancer ([place]) 2019   left breast  . Diabetes mellitus without  complication ([place])   . GERD (gastroesophageal reflux disease)   . Glaucoma   . Hypercholesterolemia   . Hypertension     SURGICAL HISTORY: Past Surgical History:  Procedure Laterality Date  . BACK SURGERY    . BREAST BIOPSY Left 02/2018  . BREAST EXCISIONAL BIOPSY Right ??  . BREAST LUMPECTOMY Left 04/03/2018   Invasive Mammary CA Neg margins  . CATARACT EXTRACTION W/ INTRAOCULAR LENS  IMPLANT, BILATERAL    . EYE SURGERY Bilateral    cataract extraction  . INCISION AND DRAINAGE ABSCESS Left 04/28/2018   Procedure: INCISION AND DRAINAGE ABSCESS;  Surgeon: Benjamine Sprague, DO;  Location: [facility] ORS;  Service: General;  Laterality: Left;  . INCISION AND DRAINAGE ABSCESS Left 06/20/2018   Procedure: INCISION AND DRAINAGE ABSCESS;  Surgeon: Herbert Pun, MD;  Location: [facility] ORS;  Service: General;  Laterality: Left;  . LUMBAR FUSION  2002?  . LUMBAR LAMINECTOMY  1997   titanium in back  . PARTIAL MASTECTOMY WITH AXILLARY SENTINEL LYMPH NODE BIOPSY Left 04/13/2018   Procedure: PARTIAL MASTECTOMY WITH AXILLARY SENTINEL LYMPH NODE BIOPSY  (No Needle Loc);  Surgeon: Benjamine Sprague, DO;  Location: [facility] ORS;  Service: General;  Laterality: Left;  . RETINAL DETACHMENT SURGERY Bilateral 2009  . TUBAL LIGATION      SOCIAL HISTORY: Social History   Socioeconomic History  . Marital status: Married    Spouse name: [name]  . Number of children: Not on file  . Years of education: Not on file  . Highest education level: Not on file  Occupational History  . Occupation: teacher/real estate  Tobacco Use  . Smoking status: Never Smoker  . Smokeless tobacco: Never Used  Substance and Sexual Activity  . Alcohol use: Never  . Drug use: Never  . Sexual activity: Not on file  Other Topics Concern  . Not on file  Social History Narrative  . Not on file   Social Determinants of Health   Financial Resource Strain:   . Difficulty of Paying Living Expenses: Not on file  Food Insecurity:   .  Worried About Charity fundraiser in the Last Year: Not on file  . Ran Out of Food in the Last Year: Not on file  Transportation Needs:   . Lack of Transportation (Medical): Not on file  . Lack of Transportation (Non-Medical): Not on file  Physical Activity:   . Days of Exercise per Week: Not on file  . Minutes of Exercise per Session: Not on file  Stress:   . Feeling of Stress : Not on file  Social Connections:   . Frequency of Communication with Friends and Family: Not on file  . Frequency of Social Gatherings with Friends and Family: Not on file  . Attends Religious Services: Not on file  . Active Member of Clubs or Organizations: Not on file  . Attends Archivist Meetings: Not on file  . Marital Status: Not on file  Intimate Partner Violence:   . Fear of Current or Ex-Partner: Not on file  . Emotionally Abused: Not on file  . Physically Abused: Not on file  . Sexually Abused: Not on file    FAMILY HISTORY: Family History  Problem Relation Age of Onset  . Diabetes Sister   . Diabetes Brother   . Diabetes Sister   .  Breast cancer Other   . Hypertension Mother   . Glaucoma Mother   . Heart attack Father     ALLERGIES:  is allergic to shellfish allergy; iodine; and sulfa antibiotics.  MEDICATIONS:  Current Outpatient Medications  Medication Sig Dispense Refill  . acetaminophen (TYLENOL) 500 MG tablet Take 500 mg by mouth every 6 (six) hours as needed for moderate pain.     . ferrous sulfate 325 (65 FE) MG EC tablet TAKE 1 TABLET (325 MG TOTAL) BY MOUTH 2 (TWO) TIMES DAILY WITH A MEAL. 60 tablet 1  . hydrALAZINE (APRESOLINE) 100 MG tablet Take 100 mg by mouth 3 (three) times daily.   3  . Multiple Vitamin (MULTI-VITAMINS) TABS Take 1 tablet by mouth daily.     Marland Kitchen anastrozole (ARIMIDEX) 1 MG tablet Take 1 tablet (1 mg total) by mouth daily. 90 tablet 1   No current facility-administered medications for this visit.     PHYSICAL EXAMINATION: ECOG PERFORMANCE  STATUS: 1 - Symptomatic but completely ambulatory Vitals:   04/13/19 1008  BP: (!) 209/85  Pulse: 68  Resp: 18  Temp: 98.4 F (36.9 C)   Filed Weights   04/13/19 1008  Weight: 158 lb (71.7 kg)    Physical Exam Constitutional:      General: She is not in acute distress. HENT:     Head: Normocephalic and atraumatic.  Eyes:     General: No scleral icterus.    Pupils: Pupils are equal, round, and reactive to light.  Cardiovascular:     Rate and Rhythm: Normal rate and regular rhythm.     Heart sounds: Normal heart sounds.  Pulmonary:     Effort: Pulmonary effort is normal. No respiratory distress.     Breath sounds: No wheezing.  Abdominal:     General: Bowel sounds are normal. There is no distension.     Palpations: Abdomen is soft. There is no mass.     Tenderness: There is no abdominal tenderness.  Musculoskeletal:        General: No deformity. Normal range of motion.     Cervical back: Normal range of motion and neck supple.  Skin:    General: Skin is warm and dry.     Findings: No erythema or rash.  Neurological:     Mental Status: She is alert and oriented to person, place, and time.     Cranial Nerves: No cranial nerve deficit.     Coordination: Coordination normal.  Psychiatric:        Behavior: Behavior normal.        Thought Content: Thought content normal.     LABORATORY DATA:  I have reviewed the data as listed Lab Results  Component Value Date   WBC 2.2 (L) 04/13/2019   HGB 8.9 (L) 04/13/2019   HCT 25.6 (L) 04/13/2019   MCV 71.1 (L) 04/13/2019   PLT 183 04/13/2019   Recent Labs    11/16/18 1245 02/14/19 1320 04/13/19 1204  NA 128* 127* 134*  K 3.8 3.6 3.2*  CL 97* 97* 101  CO2 20* 20* 23  GLUCOSE 122* 123* 131*  BUN '14 14 11  '$ CREATININE 0.75 0.94 0.62  CALCIUM 9.6 9.0 9.1  GFRNONAA >60 57* >60  GFRAA >60 >60 >60  PROT 7.5 7.4 6.9  ALBUMIN 3.9 3.8 2.9*  AST '18 23 22  '$ ALT '17 19 17  '$ ALKPHOS 45 51 58  BILITOT 0.6 0.5 1.0    Iron/TIBC/Ferritin/ %Sat    Component  Value Date/Time   IRON 46 02/25/2019 1055   TIBC 257 02/25/2019 1055   FERRITIN 60 02/25/2019 1055   IRONPCTSAT 18 02/25/2019 1055     RADIOGRAPHIC STUDIES: I have personally reviewed the radiological images as listed and agreed with the findings in the report. 03/11/2018 Diagnostic Mammogram and [place]. A radiopaque BB was placed at the site of the patient's palpable lump in the lower inner left breast at far posterior depth. A spiculated hyperdense mass is seen deep to the radiopaque BB along the chest wall. No additional suspicious findings are identified within either breast. Further evaluation with ultrasound was performed. Mammographic images were processed with CAD.  On physical exam, I palpate a firm, fixed 2-3 cm mass along the inframammary fold of the 7 o'clock position. Targeted ultrasound is performed, showing an irregular hypoechoic mass with posterior acoustic shadowing at the 7:30 position 11 cm from the nipple. It measures 2.7 x 2.5 x 2.0 cm. There is associated vascularity. This correlates well with the mammographic finding. Evaluation of the axilla demonstrates a single axillary lymph node with borderline cortical thickening of 0.4 cm. IMPRESSION: 1. Highly suspicious left breast mass corresponding with the patient's palpable lump. Recommendation is for ultrasound-guided biopsy. 2. Indeterminate left axillary lymph node. Recommendation is for ultrasound-guided biopsy.  Same as pre-op    ASSESSMENT & PLAN:  1. History of breast cancer   2. Leg edema   3. Osteopenia, unspecified location   4. Bone pain   Cancer Staging Breast cancer in female [facility]) Staging form: Breast, AJCC 8th Edition - Clinical: No stage assigned - Unsigned - Pathologic stage from 06/11/2018: Stage IIA (pT2, pN0, cM0, G2, ER+, PR-, HER2-, Oncotype DX score: 27) - Signed by Earlie Server, MD on 06/11/2018  # Stage IIA pT2 pN0 ER positive invasive mammary  carcinoma. Arimidex has been on hold.  However her pain has not improved.  #Microcytic anemia/history of iron deficiency.  Patient declines blood work today.  Plan to repeat blood work at next visit.  #Leukopenia, WBC 2.9 at the last visit.  Declined blood work.  Repeat at next visit. #Worsening of lower extremity edema.  Unknown etiology. #Recent fall, left hip pain, Will need to have x-ray for further evaluation. #Hypertension emergency, systolic blood pressures over 200s. Advised patient to go to emergency room for further evaluation.  She agrees with the plan.  I advised RN to call triage nurse to give report. # Osteopenia, continue calcium and vitamin D supplements.DEXA 11/11/2018 findings revealed osteopenia.  Recommend bisphosphate treatment with Zometa Q6 months.-Next dose due in January 2021.   Return of visit: Follow-up in January    Earlie Server, MD, PhD  04/13/2019

## 2019-04-13 NOTE — Progress Notes (Signed)
Patient prescreened for follow up appointment. Pt has swelling to both feet and legs. Has pain to both feet and pain is worse on left side. No new breast issues. Patient's blood pressure elevated, she states she didn't take bp meds this morning. Denies headache, dizziness.   Dr. Tasia Catchings recomeds for patient to go to ER due to Hypertension emergency, leg swelling with [place] negative for DVT, recent fall and hip pain. Talked to [place], [place] triage nurse, to give report.

## 2019-04-13 NOTE — ED Provider Notes (Signed)
[facility] Emergency Department Provider Note  ____________________________________________   First MD Initiated Contact with Patient 04/13/19 1302     (approximate)  I have reviewed the triage vital signs and the nursing notes.  History  Chief Complaint Hypertension and Foot Swelling (bilateral)    HPI Jackie Allison is a 78 y.o. female with hx of breast cancer, DM, HTN, HLD who was sent from the cancer clinic for asymptomatic elevated blood pressure.  She denies any associated headache, vision changes, lateralizing weakness, numbness, tingling. She states she has not taken her HTN medication this AM because she was busy getting ready for her appointment. She admits that she has not been taking them as regularly as prescribed because she has had a lot going on.   As an aside, she also complains of bilateral lower extremity swelling, which has been ongoing for 1 to 2 months.  She had an ultrasound on 12/9 that was negative for DVT.  She denies any history of VTE.  Denies any history of heart failure, kidney or liver disease.  No new medication changes.  She denies any shortness of breath or difficulty breathing.  She does report some intermittent, random pains to the left lateral lower chest wall area. These are sharp, they do not radiate. Seem to worsen with positional changes or a deep breath. This has been ongoing x 1 week. No hx of CAD per her report. No recent illnesses or infection.    Past Medical Hx Past Medical History:  Diagnosis Date  . Anemia    chronic microcytic anemia  . Breast abscess 06/20/2018  . Breast cancer ([place]) 03/2018   Invasive Mammary  . Cancer ([place]) 2019   left breast  . Diabetes mellitus without complication ([place])   . GERD (gastroesophageal reflux disease)   . Glaucoma   . Hypercholesterolemia   . Hypertension     Problem List Patient Active Problem List   Diagnosis Date Noted  . Pain due to onychomycosis of toenails of  both feet 02/17/2019  . Osteopenia 11/01/2018  . Breast cancer in female [facility]) 04/28/2018  . Lymphedema 02/14/2018  . 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 Hyperlipidemia 02/11/2018  . Diabetes ([place]) 02/11/2018    Past Surgical Hx Past Surgical History:  Procedure Laterality Date  . BACK SURGERY    . BREAST BIOPSY Left 02/2018  . BREAST EXCISIONAL BIOPSY Right ??  . BREAST LUMPECTOMY Left 04/03/2018   Invasive Mammary CA Neg margins  . CATARACT EXTRACTION W/ INTRAOCULAR LENS  IMPLANT, BILATERAL    . EYE SURGERY Bilateral    cataract extraction  . INCISION AND DRAINAGE ABSCESS Left 04/28/2018   Procedure: INCISION AND DRAINAGE ABSCESS;  Surgeon: Benjamine Sprague, DO;  Location: [facility] ORS;  Service: General;  Laterality: Left;  . INCISION AND DRAINAGE ABSCESS Left 06/20/2018   Procedure: INCISION AND DRAINAGE ABSCESS;  Surgeon: Herbert Pun, MD;  Location: [facility] ORS;  Service: General;  Laterality: Left;  . LUMBAR FUSION  2002?  . LUMBAR LAMINECTOMY  1997   titanium in back  . PARTIAL MASTECTOMY WITH AXILLARY SENTINEL LYMPH NODE BIOPSY Left 04/13/2018   Procedure: PARTIAL MASTECTOMY WITH AXILLARY SENTINEL LYMPH NODE BIOPSY  (No Needle Loc);  Surgeon: Benjamine Sprague, DO;  Location: [facility] ORS;  Service: General;  Laterality: Left;  . RETINAL DETACHMENT SURGERY Bilateral 2009  . TUBAL LIGATION      Medications Prior to Admission medications   Medication Sig Start Date End Date Taking? Authorizing Provider  acetaminophen (  TYLENOL) 500 MG tablet Take 500 mg by mouth every 6 (six) hours as needed for moderate pain.  11/04/16   [provider]  anastrozole (ARIMIDEX) 1 MG tablet Take 1 tablet (1 mg total) by mouth daily. Patient not taking: Reported on 04/12/2019 04/01/19   Earlie Server, MD  aspirin 81 MG EC tablet Take 81 mg by mouth daily.     [provider]  atorvastatin (LIPITOR) 10 MG tablet Take 10 mg by mouth every evening.  10/16/16   [provider]    benazepril (LOTENSIN) 40 MG tablet Take 40 mg by mouth daily.  09/11/17 04/12/19  [provider]  brimonidine (ALPHAGAN) 0.2 % ophthalmic solution Place 1 drop into the right eye 2 (two) times daily.     [provider]  carvedilol (COREG) 12.5 MG tablet Take 12.5 mg by mouth 2 (two) times daily with a meal.     [provider]  chlorhexidine (PERIDEX) 0.12 % solution Use as directed 15 mLs in the mouth or throat 2 (two) times daily.  09/07/17   [provider]  Coenzyme Q10 (COQ10 PO) Take 1 tablet by mouth daily.    [provider]  docusate sodium (COLACE) 100 MG capsule Take 100 mg by mouth daily as needed for mild constipation.    [provider]  dorzolamide-timolol (COSOPT) 22.3-6.8 MG/ML ophthalmic solution Place 1 drop into both eyes 2 (two) times daily. 03/12/18   [provider]  ferrous sulfate 325 (65 FE) MG EC tablet TAKE 1 TABLET (325 MG TOTAL) BY MOUTH 2 (TWO) TIMES DAILY WITH A MEAL. 02/03/19   Earlie Server, MD  hydrALAZINE (APRESOLINE) 100 MG tablet Take 100 mg by mouth 3 (three) times daily.  02/04/18   [provider]  ibuprofen (ADVIL,MOTRIN) 800 MG tablet Take 1 tablet (800 mg total) by mouth every 8 (eight) hours as needed for mild pain or moderate pain. 04/13/18   Lysle Pearl, Isami, DO  latanoprost (XALATAN) 0.005 % ophthalmic solution Place 1 drop into both eyes at bedtime.  01/30/17   [provider]  lidocaine-prilocaine (EMLA) cream Apply to affected area once 06/11/18   Earlie Server, MD  metFORMIN (GLUCOPHAGE) 500 MG tablet Take 500 mg by mouth daily with breakfast.  10/23/16   [provider]  Multiple Vitamin (MULTI-VITAMINS) TABS Take 1 tablet by mouth daily.     [provider]  omeprazole (PRILOSEC) 40 MG capsule Take 40 mg by mouth daily.  12/02/16   [provider]  Specialty Vitamins Products (MAGNESIUM, AMINO ACID CHELATE,) 133 MG tablet Take 1 tablet by mouth daily.     [provider]  trolamine salicylate (ASPERCREME) 10 % cream Apply 1 application topically as needed for muscle pain.    [provider]    Allergies Shellfish allergy, Iodine, and Sulfa antibiotics  Family Hx Family History  Problem Relation Age of Onset  . Diabetes Sister   . Diabetes Brother   . Diabetes Sister   . Breast cancer Other   . Hypertension Mother   . Glaucoma Mother   . Heart attack Father     Social Hx Social History   Tobacco Use  . Smoking status: Never Smoker  . Smokeless tobacco: Never Used  Substance Use Topics  . Alcohol use: Never  . Drug use: Never     Review of Systems  Constitutional: Negative for fever, chills. Eyes: Negative for visual changes. ENT: Negative for sore throat. Cardiovascular: Negative for chest  pain. Respiratory: Negative for shortness of breath. Gastrointestinal: Negative for nausea, vomiting.  Genitourinary: Negative for dysuria. Musculoskeletal: + for leg swelling. Skin: Negative for rash. Neurological: Negative for for headaches.   Physical Exam  Vital Signs: ED Triage Vitals  Enc Vitals Group     BP 04/13/19 1142 (!) 210/78     Pulse Rate 04/13/19 1142 69     Resp 04/13/19 1142 16     Temp 04/13/19 1142 98.8 F (37.1 C)     Temp Source 04/13/19 1307 Oral     SpO2 04/13/19 1142 99 %     Weight 04/13/19 1143 158 lb (71.7 kg)     Height 04/13/19 1143 6' (1.829 m)     Head Circumference --      Peak Flow --      Pain Score 04/13/19 1143 6     Pain Loc --      Pain Edu? --      Excl. in [place]? --     Constitutional: Alert and oriented.  Head: Normocephalic. Atraumatic. Eyes: Conjunctivae clear. Sclera anicteric. Nose: No congestion. No rhinorrhea. Mouth/Throat: Wearing mask.  Neck: No stridor.   Cardiovascular: Normal rate, regular rhythm. Extremities well perfused. Chest pain reproducible with palpation to left lateral lower chest wall. No overlying skin changes.  Respiratory: Normal  respiratory effort.  Lungs CTAB. Gastrointestinal: Soft. Non-tender. Non-distended.  Musculoskeletal: Bilateral, symmetric pitting edema of the lower extremities to the level of the knee. Neurologic:  Normal speech and language. No gross focal neurologic deficits are appreciated.  Skin: Skin is warm, dry and intact. No rash noted. Psychiatric: Mood and affect are appropriate for situation.  EKG  Personally reviewed.   Rate: 68 Rhythm: sinus Axis: normal Intervals: WNL No acute ischemic changes No STEMI    Radiology  CXR:  IMPRESSION:  1. Interval minimal changes of congestive heart failure.  2. Stable borderline cardiomegaly, pulmonary vascular congestion and  left basilar linear scarring.   XR left hip:  IMPRESSION:  Negative.    Procedures  Procedure(s) performed (including critical care):  Procedures   Initial Impression / Assessment and Plan / ED Course  78 y.o. female who presents to the ED from her cancer clinic, referred for elevated blood pressure. She is asymptomatic from this perspective.  Also complains of ongoing bilateral lower extremity swelling x 1-2 months, as well as 1 week intermittent left-sided chest discomfort.  Repeat BP down trending on recheck to 190s/80s. As she is asymptomatic, do not feel acute lowering is indicated at this time. Will review her home medications and administer them here.   With regards to her leg swelling, she has had a negative [place]. Does not appear to be on amlodipine. She does have decreased albumin on labs today which could be contributing. Would recommend seeing lymphedema clinic.   Will obtain XR, EKG, basic labs. Chest pain likely MSK in etiology, very atypical in description. Doubt PE given her negative ultrasound, no tachycardia, tachypnea, SOB, or hypoxia. No evidence of shingles on exam.  Labs otherwise are at her baseline. Troponin negative. BNP is not significant. CXR stable, minimal changes. She is ambulatory  with a steady gait, continues to be asymptomatic w/ regards to her BP and feels comfortable with discharge.  Recommended asking her occupational/physical therapist for referral to the lymphedema clinic.  Also advised follow-up with her oncologist in the clinic.  Discussed return precautions, and the importance of medication adherence.  She voices understanding of this and is comfortable  to plan and discharge.   Final Clinical Impression(s) / ED Diagnosis  Final diagnoses:  History of high blood pressure  Bilateral leg edema       Note:  This document was prepared using Dragon voice recognition software and may include unintentional dictation errors.   Lilia Pro., MD 04/13/19 [phone]

## 2019-04-13 NOTE — ED Notes (Signed)
Patient transported to X-ray 

## 2019-04-13 NOTE — Therapy (Signed)
[facility] [address] [place], [place], [place], [address] Phone: [phone]   Fax:  [phone]  Occupational Therapy Screen Patient Details  Name: Jackie Allison MRN: [id number] Date of Birth: 1940-05-01 No data recorded  Encounter Date: 04/13/2019    Past Medical History:  Diagnosis Date  . Anemia    chronic microcytic anemia  . Breast abscess 06/20/2018  . Breast cancer ([place]) 03/2018   Invasive Mammary  . Cancer ([place]) 2019   left breast  . Diabetes mellitus without complication ([place])   . GERD (gastroesophageal reflux disease)   . Glaucoma   . Hypercholesterolemia   . Hypertension     Past Surgical History:  Procedure Laterality Date  . BACK SURGERY    . BREAST BIOPSY Left 02/2018  . BREAST EXCISIONAL BIOPSY Right ??  . BREAST LUMPECTOMY Left 04/03/2018   Invasive Mammary CA Neg margins  . CATARACT EXTRACTION W/ INTRAOCULAR LENS  IMPLANT, BILATERAL    . EYE SURGERY Bilateral    cataract extraction  . INCISION AND DRAINAGE ABSCESS Left 04/28/2018   Procedure: INCISION AND DRAINAGE ABSCESS;  Surgeon: Benjamine Sprague, DO;  Location: [facility] ORS;  Service: General;  Laterality: Left;  . INCISION AND DRAINAGE ABSCESS Left 06/20/2018   Procedure: INCISION AND DRAINAGE ABSCESS;  Surgeon: Herbert Pun, MD;  Location: [facility] ORS;  Service: General;  Laterality: Left;  . LUMBAR FUSION  2002?  . LUMBAR LAMINECTOMY  1997   titanium in back  . PARTIAL MASTECTOMY WITH AXILLARY SENTINEL LYMPH NODE BIOPSY Left 04/13/2018   Procedure: PARTIAL MASTECTOMY WITH AXILLARY SENTINEL LYMPH NODE BIOPSY  (No Needle Loc);  Surgeon: Benjamine Sprague, DO;  Location: [facility] ORS;  Service: General;  Laterality: Left;  . RETINAL DETACHMENT SURGERY Bilateral 2009  . TUBAL LIGATION      There were no vitals filed for this visit.  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 Continue Arimidex.  03/29/2019 Bilateral mammogram showed increased size lymph nodes within the outer right breast and right axilla. Recommend ultrasound-guided biopsy of right axillary lymph nodes. 03/04/2019, bone scan negative for bone metastasis.  #Leukopenia, flow cytometry negative.  Multiple myeloma panel negative, work-up results indicate reactive process.  Continue to monitor.  #Microcytic anemia/history of iron deficiency. normal iron panel now. Normal hemoglobinopathy evaluation.possible alpha thalassemia treait.   # Osteopenia, continue calcium and vitamin D supplements.DEXA 11/11/2018 findings revealed osteopenia.  Recommend bisphosphate treatment with Zometa Q6 months.-Next dose due in January 2021. # Leg edema, worsened. Check b/l LE US venous   This date - pt come in to see Dr Tasia Catchings:  Pt has swelling to both feet and legs. Has pain to both feet and pain is worse on left side. No new breast issues. Patient's blood pressure elevated, she states she didn't take bp meds this morning. Denies headache, dizziness.   Dr. Tasia Catchings recomeds for patient to go to ER due to Hypertension emergency, leg swelling with [place] negative for DVT, recent fall and hip  pain. Talked to [facility] [place] triage nurse, to give report.      OT screen:  Talked to pt and husband - pt report having the swelling now for while -doppler was negative for DVT's   Pt on BP medication and report her swelling is below the knees - and she cannot get shoes on anymore - had to buy crocs and cut her socks  She also report arthritis pain - more in the joints gradually - affecting her shoulder AROM and hand stiffness  She do no have a lot of energy  - and do not walk or really do a lot at home  Use cane sometimes.  She and husband report that this fall was the only one in the last 6 months.  Pt refer to ER this date by Dr Tasia Catchings - see note above - told pt and husband that I will follow up with them in 2-3 wks for possible referral to see Helene Kelp the lymphedema therapist for her legs if appropriate                                 Patient will benefit from skilled therapeutic intervention in order to improve the following deficits and impairments:           Visit Diagnosis: Leg edema    Problem List Patient Active Problem List   Diagnosis Date Noted  . Pain due to onychomycosis of toenails of both feet 02/17/2019  . Osteopenia 11/01/2018  . Breast cancer in female [facility]) 04/28/2018  . Lymphedema 02/14/2018  . 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 Hyperlipidemia 02/11/2018  . Diabetes ([place]) 02/11/2018    Rosalyn Gess OTR/L,CLT 04/13/2019, 11:43 AM  [facility] [address] [place], [place], [place], [address] Phone: [phone]   Fax:  [phone]  Name: Naelani Lafrance MRN: [id number] Date of Birth: 26-Dec-1940

## 2019-04-29 ENCOUNTER — Other Ambulatory Visit: Payer: Self-pay

## 2019-04-29 ENCOUNTER — Emergency Department: Payer: Medicare Other

## 2019-04-29 ENCOUNTER — Emergency Department
Admission: EM | Admit: 2019-04-29 | Discharge: 2019-04-29 | Disposition: A | Discharge status: Home / Self Care | Payer: Medicare Other | Attending: Emergency Medicine | Admitting: Emergency Medicine

## 2019-04-29 DIAGNOSIS — D508 Other iron deficiency anemias: Secondary | ICD-10-CM | POA: Diagnosis not present

## 2019-04-29 DIAGNOSIS — Z79899 Other long term (current) drug therapy: Secondary | ICD-10-CM | POA: Diagnosis not present

## 2019-04-29 DIAGNOSIS — R630 Anorexia: Secondary | ICD-10-CM | POA: Insufficient documentation

## 2019-04-29 DIAGNOSIS — I10 Essential (primary) hypertension: Secondary | ICD-10-CM | POA: Insufficient documentation

## 2019-04-29 DIAGNOSIS — Z853 Personal history of malignant neoplasm of breast: Secondary | ICD-10-CM | POA: Insufficient documentation

## 2019-04-29 DIAGNOSIS — E119 Type 2 diabetes mellitus without complications: Secondary | ICD-10-CM | POA: Diagnosis not present

## 2019-04-29 DIAGNOSIS — Z20828 Contact with and (suspected) exposure to other viral communicable diseases: Secondary | ICD-10-CM | POA: Insufficient documentation

## 2019-04-29 DIAGNOSIS — R600 Localized edema: Secondary | ICD-10-CM | POA: Insufficient documentation

## 2019-04-29 DIAGNOSIS — R5383 Other fatigue: Secondary | ICD-10-CM | POA: Diagnosis present

## 2019-04-29 LAB — CBC WITH DIFFERENTIAL/PLATELET
Abs Immature Granulocytes: 0.01 10*3/uL (ref 0.00–0.07)
Basophils Absolute: 0 10*3/uL (ref 0.0–0.1)
Basophils Relative: 0 %
Eosinophils Absolute: 0 10*3/uL (ref 0.0–0.5)
Eosinophils Relative: 0 %
HCT: 27 % — ABNORMAL LOW (ref 36.0–46.0)
Hemoglobin: 8.5 g/dL — ABNORMAL LOW (ref 12.0–15.0)
Immature Granulocytes: 0 %
Lymphocytes Relative: 15 %
Lymphs Abs: 0.5 10*3/uL — ABNORMAL LOW (ref 0.7–4.0)
MCH: 23.7 pg — ABNORMAL LOW (ref 26.0–34.0)
MCHC: 31.5 g/dL (ref 30.0–36.0)
MCV: 75.4 fL — ABNORMAL LOW (ref 80.0–100.0)
Monocytes Absolute: 0.2 10*3/uL (ref 0.1–1.0)
Monocytes Relative: 8 %
Neutro Abs: 2.4 10*3/uL (ref 1.7–7.7)
Neutrophils Relative %: 77 %
Platelets: 162 10*3/uL (ref 150–400)
RBC: 3.58 MIL/uL — ABNORMAL LOW (ref 3.87–5.11)
RDW: 17.1 % — ABNORMAL HIGH (ref 11.5–15.5)
WBC: 3.1 10*3/uL — ABNORMAL LOW (ref 4.0–10.5)
nRBC: 0 % (ref 0.0–0.2)

## 2019-04-29 LAB — COMPREHENSIVE METABOLIC PANEL
ALT: 17 U/L (ref 0–44)
AST: 27 U/L (ref 15–41)
Albumin: 2.8 g/dL — ABNORMAL LOW (ref 3.5–5.0)
Alkaline Phosphatase: 63 U/L (ref 38–126)
Anion gap: 13 (ref 5–15)
BUN: 13 mg/dL (ref 8–23)
CO2: 20 mmol/L — ABNORMAL LOW (ref 22–32)
Calcium: 9.3 mg/dL (ref 8.9–10.3)
Chloride: 99 mmol/L (ref 98–111)
Creatinine, Ser: 0.62 mg/dL (ref 0.44–1.00)
GFR calc Af Amer: >60 mL/min (ref >60)
GFR calc non Af Amer: >60 mL/min (ref >60)
Glucose, Bld: 150 mg/dL — ABNORMAL HIGH (ref 70–99)
Potassium: 3.6 mmol/L (ref 3.5–5.1)
Sodium: 132 mmol/L — ABNORMAL LOW (ref 135–145)
Total Bilirubin: 0.8 mg/dL (ref 0.3–1.2)
Total Protein: 7 g/dL (ref 6.5–8.1)

## 2019-04-29 LAB — URINALYSIS, COMPLETE (UACMP) WITH MICROSCOPIC
Bacteria, UA: NONE SEEN
Bilirubin Urine: NEGATIVE
Glucose, UA: NEGATIVE mg/dL
Hgb urine dipstick: NEGATIVE
Ketones, ur: NEGATIVE mg/dL
Leukocytes,Ua: NEGATIVE
Nitrite: NEGATIVE
Protein, ur: NEGATIVE mg/dL
Specific Gravity, Urine: 1.018 (ref 1.005–1.030)
pH: 5 (ref 5.0–8.0)

## 2019-04-29 LAB — IRON AND TIBC
Iron: 35 ug/dL (ref 28–170)
Saturation Ratios: 19 % (ref 10.4–31.8)
TIBC: 183 ug/dL — ABNORMAL LOW (ref 250–450)
UIBC: 148 ug/dL

## 2019-04-29 LAB — FERRITIN: Ferritin: 162 ng/mL (ref 11–307)

## 2019-04-29 LAB — POC SARS CORONAVIRUS 2 AG -  ED: SARS Coronavirus 2 Ag: NEGATIVE

## 2019-04-29 MED ORDER — SODIUM CHLORIDE 0.9 % IV SOLN
510.0000 mg | Freq: Once | INTRAVENOUS | Status: AC
  Administered 2019-04-29: 510 mg via INTRAVENOUS
  Filled 2019-04-29: qty 17

## 2019-04-29 NOTE — ED Provider Notes (Signed)
[facility] Emergency Department Provider Note   ____________________________________________    I have reviewed the triage vital signs and the nursing notes.   HISTORY  Chief Complaint abnormal labs and Weakness     HPI Jackie Allison is a 79 y.o. female with a history of diabetes, hypertension, breast CA treated by Dr. Tasia Catchings of [place].  Patient reports significant fatigue over the last month she complains of weakness and decreased appetite.  Seen by PCP yesterday sent over for evaluation because of abnormal labs.  Denies abnormal stools.  No vomiting.  No fevers cough shortness of breath.  Past Medical History:  Diagnosis Date  . Anemia    chronic microcytic anemia  . Breast abscess 06/20/2018  . Breast cancer ([place]) 03/2018   Invasive Mammary  . Cancer ([place]) 2019   left breast  . Diabetes mellitus without complication ([place])   . GERD (gastroesophageal reflux disease)   . Glaucoma   . Hypercholesterolemia   . Hypertension     Patient Active Problem List   Diagnosis Date Noted  . Pain due to onychomycosis of toenails of both feet 02/17/2019  . Osteopenia 11/01/2018  . Breast cancer in female [facility]) 04/28/2018  . Lymphedema 02/14/2018  . 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 Hyperlipidemia 02/11/2018  . Diabetes ([place]) 02/11/2018    Past Surgical History:  Procedure Laterality Date  . BACK SURGERY    . BREAST BIOPSY Left 02/2018  . BREAST EXCISIONAL BIOPSY Right ??  . BREAST LUMPECTOMY Left 04/03/2018   Invasive Mammary CA Neg margins  . CATARACT EXTRACTION W/ INTRAOCULAR LENS  IMPLANT, BILATERAL    . EYE SURGERY Bilateral    cataract extraction  . INCISION AND DRAINAGE ABSCESS Left 04/28/2018   Procedure: INCISION AND DRAINAGE ABSCESS;  Surgeon: Benjamine Sprague, DO;  Location: [facility] ORS;  Service: General;  Laterality: Left;  . INCISION AND DRAINAGE ABSCESS Left 06/20/2018   Procedure: INCISION AND DRAINAGE ABSCESS;  Surgeon:  Herbert Pun, MD;  Location: [facility] ORS;  Service: General;  Laterality: Left;  . LUMBAR FUSION  2002?  . LUMBAR LAMINECTOMY  1997   titanium in back  . PARTIAL MASTECTOMY WITH AXILLARY SENTINEL LYMPH NODE BIOPSY Left 04/13/2018   Procedure: PARTIAL MASTECTOMY WITH AXILLARY SENTINEL LYMPH NODE BIOPSY  (No Needle Loc);  Surgeon: Benjamine Sprague, DO;  Location: [facility] ORS;  Service: General;  Laterality: Left;  . RETINAL DETACHMENT SURGERY Bilateral 2009  . TUBAL LIGATION      Prior to Admission medications   Medication Sig Start Date End Date Taking? Authorizing Provider  acetaminophen (TYLENOL) 500 MG tablet Take 500 mg by mouth every 6 (six) hours as needed for moderate pain.  11/04/16  Yes [provider]  anastrozole (ARIMIDEX) 1 MG tablet Take 1 tablet (1 mg total) by mouth daily. 04/01/19  Yes Earlie Server, MD  bumetanide (BUMEX) 1 MG tablet Take 1 mg by mouth daily. 04/15/19 04/14/20 Yes [provider]  ferrous sulfate 325 (65 FE) MG EC tablet TAKE 1 TABLET (325 MG TOTAL) BY MOUTH 2 (TWO) TIMES DAILY WITH A MEAL. 02/03/19  Yes Earlie Server, MD  hydrALAZINE (APRESOLINE) 100 MG tablet Take 100 mg by mouth 3 (three) times daily.  02/04/18  Yes [provider]  Multiple Vitamin (MULTI-VITAMINS) TABS Take 1 tablet by mouth daily.    Yes [provider]  potassium chloride SA (KLOR-CON) 20 MEQ tablet Take 20 mEq by mouth daily. 04/15/19 04/14/20 Yes [provider]  Allergies Shellfish allergy, Iodine, and Sulfa antibiotics  Family History  Problem Relation Age of Onset  . Diabetes Sister   . Diabetes Brother   . Diabetes Sister   . Breast cancer Other   . Hypertension Mother   . Glaucoma Mother   . Heart attack Father     Social History Social History   Tobacco Use  . Smoking status: Never Smoker  . Smokeless tobacco: Never Used  Substance Use Topics  . Alcohol use: Never  . Drug use: Never    Review of Systems  Constitutional:  As above Eyes: No visual changes.  ENT: Decreased p.o. intake Cardiovascular: Denies chest pain. Respiratory: Denies shortness of breath. Gastrointestinal: No abdominal pain.   Genitourinary: Negative for dysuria. Musculoskeletal: "Bone pain " Skin: Negative for rash. Neurological: Negative for headaches   ____________________________________________   PHYSICAL EXAM:  VITAL SIGNS: ED Triage Vitals  Enc Vitals Group     BP 04/29/19 0851 (!) 134/57     Pulse Rate 04/29/19 0851 75     Resp 04/29/19 0851 17     Temp 04/29/19 0851 99.1 F (37.3 C)     Temp Source 04/29/19 0851 Oral     SpO2 04/29/19 0851 100 %     Weight 04/29/19 0846 67.1 kg (148 lb)     Height 04/29/19 0846 1.676 m (5\' 6" )     Head Circumference --      Peak Flow --      Pain Score 04/29/19 0846 6     Pain Loc --      Pain Edu? --      Excl. in [place]? --     Constitutional: Alert and oriented.   Nose: No congestion/rhinnorhea. Mouth/Throat: Mucous membranes are moist.    Cardiovascular: Normal rate, regular rhythm. Kermit Balo peripheral circulation. Respiratory: Normal respiratory effort.  No retractions. Gastrointestinal: Soft and nontender. No distention.    Musculoskeletal: 2+ edema bilaterally.  Warm and well perfused Neurologic:  Normal speech and language. No gross focal neurologic deficits are appreciated.  Skin:  Skin is warm, dry and intact. No rash noted. Psychiatric: Mood and affect are normal. Speech and behavior are normal.  ____________________________________________   LABS (all labs ordered are listed, but only abnormal results are displayed)  Labs Reviewed  CBC WITH DIFFERENTIAL/PLATELET - Abnormal; Notable for the following components:      Result Value   WBC 3.1 (*)    RBC 3.58 (*)    Hemoglobin 8.5 (*)    HCT 27.0 (*)    MCV 75.4 (*)    MCH 23.7 (*)    RDW 17.1 (*)    Lymphs Abs 0.5 (*)    All other components within normal limits  COMPREHENSIVE METABOLIC PANEL -  Abnormal; Notable for the following components:   Sodium 132 (*)    CO2 20 (*)    Glucose, Bld 150 (*)    Albumin 2.8 (*)    All other components within normal limits  URINALYSIS, COMPLETE (UACMP) WITH MICROSCOPIC - Abnormal; Notable for the following components:   Color, Urine YELLOW (*)    APPearance HAZY (*)    All other components within normal limits  IRON AND TIBC - Abnormal; Notable for the following components:   TIBC 183 (*)    All other components within normal limits  FERRITIN  POC SARS CORONAVIRUS 2 AG -  ED   ____________________________________________  EKG  ED ECG REPORT I, Lavonia Drafts, the attending physician,  personally viewed and interpreted this ECG.  Date: 04/29/2019  Rhythm: normal sinus rhythm QRS Axis: normal Intervals: normal ST/T Wave abnormalities: Nonspecific changes Narrative Interpretation: no evidence of acute ischemia  ____________________________________________  RADIOLOGY  Chest x-ray unremarkable ____________________________________________   PROCEDURES  Procedure(s) performed: No  Procedures   Critical Care performed: No ____________________________________________   INITIAL IMPRESSION / ASSESSMENT AND PLAN / ED COURSE  Pertinent labs & imaging results that were available during my care of the patient were reviewed by me and considered in my medical decision making (see chart for details).  Patient's work-up here is overall reassuring, hemoglobin of 8.5, consistent with microcytic anemia 8.9 2 weeks ago.  Afebrile, chest x-ray labs unremarkable.  Discussed with Dr. Rogue Bussing of oncology who recommends IV Feraheme infusion and outpatient follow-up with Dr. Tasia Catchings as iron deficiency anemia is likely the cause of her weakness/fatigue    ____________________________________________   FINAL CLINICAL IMPRESSION(S) / ED DIAGNOSES  Final diagnoses:  Iron deficiency anemia secondary to inadequate dietary iron intake         Note:  This document was prepared using Dragon voice recognition software and may include unintentional dictation errors.   Lavonia Drafts, MD 04/29/19 [phone]

## 2019-04-29 NOTE — ED Provider Notes (Signed)
Procedures     ----------------------------------------- 4:07 PM on 04/29/2019 -----------------------------------------  Iron infusion completed at 2:30 PM.  Since then patient has remained asymptomatic, vital signs unremarkable no shortness of breath wheezing rash or other signs or symptoms of acute reaction to the infusion.  Stable for outpatient follow-up with hematology as planned by Dr. Corky Downs.    Carrie Mew, MD 04/29/19 [phone]

## 2019-04-29 NOTE — ED Triage Notes (Addendum)
Pt is a cancer pt - Pt reports that PCP told her her WBC were low and Fe was low yesterday - PCP said to come to ED last pm and pt waited until today

## 2019-04-29 NOTE — Discharge Instructions (Addendum)
Please follow-up with Dr. Tasia Catchings as we discussed

## 2019-05-02 ENCOUNTER — Telehealth: Payer: Self-pay | Admitting: *Deleted

## 2019-05-02 NOTE — Telephone Encounter (Signed)
Dr Edwena Felty office called to inform Dr Tasia Catchings that patient was seen in ER Friday and that she has wbc and hbg changes as well as petechia. Asking if she needs to be seen sooner than her Wednesday appointment. She did get an iron infusion in the ER Friday. Please advise

## 2019-05-02 NOTE — Telephone Encounter (Signed)
I called patient to see how she is doing today. Patient states she is still not feeling all that great, still weak, feet still swollen, otherwise "I am doing ok I guess" Advised to call back if she gets worse and that we will see her Wednesday, she asked about changing her treatment, I told her she could discuss that with physician [place]ned[place]

## 2019-05-02 NOTE — Telephone Encounter (Signed)
Jackie Allison, will  you all patient and see how she is feeling today.  She is scheduled to see Dr. Tasia Catchings on Wed with lab/MD/Zometa.

## 2019-05-03 ENCOUNTER — Other Ambulatory Visit: Payer: Self-pay | Admitting: Oncology

## 2019-05-03 DIAGNOSIS — D649 Anemia, unspecified: Secondary | ICD-10-CM

## 2019-05-03 DIAGNOSIS — D509 Iron deficiency anemia, unspecified: Secondary | ICD-10-CM

## 2019-05-04 ENCOUNTER — Inpatient Hospital Stay: Payer: Medicare Other

## 2019-05-04 ENCOUNTER — Inpatient Hospital Stay: Payer: Medicare Other | Attending: Oncology

## 2019-05-04 ENCOUNTER — Encounter: Payer: Self-pay | Admitting: Oncology

## 2019-05-04 ENCOUNTER — Inpatient Hospital Stay (HOSPITAL_BASED_OUTPATIENT_CLINIC_OR_DEPARTMENT_OTHER): Payer: Medicare Other | Admitting: Oncology

## 2019-05-04 ENCOUNTER — Other Ambulatory Visit: Payer: Self-pay

## 2019-05-04 VITALS — BP 154/80 | HR 73 | Temp 97.5°F | Resp 20

## 2019-05-04 DIAGNOSIS — Z923 Personal history of irradiation: Secondary | ICD-10-CM | POA: Diagnosis not present

## 2019-05-04 DIAGNOSIS — K219 Gastro-esophageal reflux disease without esophagitis: Secondary | ICD-10-CM | POA: Insufficient documentation

## 2019-05-04 DIAGNOSIS — E7800 Pure hypercholesterolemia, unspecified: Secondary | ICD-10-CM | POA: Insufficient documentation

## 2019-05-04 DIAGNOSIS — Z79899 Other long term (current) drug therapy: Secondary | ICD-10-CM | POA: Diagnosis not present

## 2019-05-04 DIAGNOSIS — I119 Hypertensive heart disease without heart failure: Secondary | ICD-10-CM | POA: Insufficient documentation

## 2019-05-04 DIAGNOSIS — E119 Type 2 diabetes mellitus without complications: Secondary | ICD-10-CM | POA: Diagnosis not present

## 2019-05-04 DIAGNOSIS — R600 Localized edema: Secondary | ICD-10-CM

## 2019-05-04 DIAGNOSIS — Z853 Personal history of malignant neoplasm of breast: Secondary | ICD-10-CM | POA: Diagnosis present

## 2019-05-04 DIAGNOSIS — R5383 Other fatigue: Secondary | ICD-10-CM | POA: Insufficient documentation

## 2019-05-04 DIAGNOSIS — Z9223 Personal history of estrogen therapy: Secondary | ICD-10-CM | POA: Diagnosis not present

## 2019-05-04 DIAGNOSIS — E871 Hypo-osmolality and hyponatremia: Secondary | ICD-10-CM | POA: Diagnosis not present

## 2019-05-04 DIAGNOSIS — Z170 Estrogen receptor positive status [ER+]: Secondary | ICD-10-CM | POA: Insufficient documentation

## 2019-05-04 DIAGNOSIS — D72810 Lymphocytopenia: Secondary | ICD-10-CM

## 2019-05-04 DIAGNOSIS — M8580 Other specified disorders of bone density and structure, unspecified site: Secondary | ICD-10-CM

## 2019-05-04 DIAGNOSIS — I517 Cardiomegaly: Secondary | ICD-10-CM | POA: Diagnosis not present

## 2019-05-04 DIAGNOSIS — D509 Iron deficiency anemia, unspecified: Secondary | ICD-10-CM

## 2019-05-04 DIAGNOSIS — D649 Anemia, unspecified: Secondary | ICD-10-CM

## 2019-05-04 DIAGNOSIS — M898X9 Other specified disorders of bone, unspecified site: Secondary | ICD-10-CM

## 2019-05-04 LAB — RETIC PANEL
Immature Retic Fract: 9.5 % (ref 2.3–15.9)
RBC.: 3.67 MIL/uL — ABNORMAL LOW (ref 3.87–5.11)
Retic Count, Absolute: 34.1 10*3/uL (ref 19.0–186.0)
Retic Ct Pct: 0.9 % (ref 0.4–3.1)
Reticulocyte Hemoglobin: 31.2 pg (ref >27.9)

## 2019-05-04 LAB — COMPREHENSIVE METABOLIC PANEL
ALT: 20 U/L (ref 0–44)
AST: 25 U/L (ref 15–41)
Albumin: 3 g/dL — ABNORMAL LOW (ref 3.5–5.0)
Alkaline Phosphatase: 61 U/L (ref 38–126)
Anion gap: 9 (ref 5–15)
BUN: 15 mg/dL (ref 8–23)
CO2: 24 mmol/L (ref 22–32)
Calcium: 10 mg/dL (ref 8.9–10.3)
Chloride: 96 mmol/L — ABNORMAL LOW (ref 98–111)
Creatinine, Ser: 0.78 mg/dL (ref 0.44–1.00)
GFR calc Af Amer: >60 mL/min (ref >60)
GFR calc non Af Amer: >60 mL/min (ref >60)
Glucose, Bld: 114 mg/dL — ABNORMAL HIGH (ref 70–99)
Potassium: 3.5 mmol/L (ref 3.5–5.1)
Sodium: 129 mmol/L — ABNORMAL LOW (ref 135–145)
Total Bilirubin: 0.7 mg/dL (ref 0.3–1.2)
Total Protein: 7.6 g/dL (ref 6.5–8.1)

## 2019-05-04 LAB — CBC WITH DIFFERENTIAL/PLATELET
Abs Immature Granulocytes: 0.02 10*3/uL (ref 0.00–0.07)
Basophils Absolute: 0 10*3/uL (ref 0.0–0.1)
Basophils Relative: 0 %
Eosinophils Absolute: 0 10*3/uL (ref 0.0–0.5)
Eosinophils Relative: 0 %
HCT: 28.2 % — ABNORMAL LOW (ref 36.0–46.0)
Hemoglobin: 9 g/dL — ABNORMAL LOW (ref 12.0–15.0)
Immature Granulocytes: 1 %
Lymphocytes Relative: 14 %
Lymphs Abs: 0.4 10*3/uL — ABNORMAL LOW (ref 0.7–4.0)
MCH: 24.5 pg — ABNORMAL LOW (ref 26.0–34.0)
MCHC: 31.9 g/dL (ref 30.0–36.0)
MCV: 76.8 fL — ABNORMAL LOW (ref 80.0–100.0)
Monocytes Absolute: 0.2 10*3/uL (ref 0.1–1.0)
Monocytes Relative: 6 %
Neutro Abs: 2.3 10*3/uL (ref 1.7–7.7)
Neutrophils Relative %: 79 %
Platelets: 160 10*3/uL (ref 150–400)
RBC: 3.67 MIL/uL — ABNORMAL LOW (ref 3.87–5.11)
RDW: 17.6 % — ABNORMAL HIGH (ref 11.5–15.5)
WBC: 3 10*3/uL — ABNORMAL LOW (ref 4.0–10.5)
nRBC: 0 % (ref 0.0–0.2)

## 2019-05-04 NOTE — Progress Notes (Signed)
Patient went to ER last week with rash on legs L>R and ankle edema.  The ER administered IV iron.  She reports that the rash has improved but the ankle edema is stable.

## 2019-05-04 NOTE — Progress Notes (Signed)
Hematology/Oncology follow up note [facility] [phone] [phone]   Patient Care Team: Dion Body, MD as PCP - General (Family Medicine) Earlie Server, MD as Consulting Physician (Hematology and Oncology)  REASON FOR VISIT:  Follow up for management  of Stage IIA breast cancer  HISTORY OF PRESENTING ILLNESS:  Jackie Allison is a  79 y.o.  female with PMH listed below who was referred to me for evaluation of breast cancer Self palpated left breast mass for 1 month.  Patient had mammogram and ultrasound on 03/11/2018 which showed suspicious left breast mass, indeterminate left aixllary lymph node.  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  Nipple discharge: denies.  Family history: great niece had breast cancer OCP use: denies.  Estrogen and progesterone therapy: denies History of radiation to chest: denies.  Previous breast surgery: previous right breast biopsy.    #  04/13/2018 underwent left lumpectomy and sentinel lymph node biopsy on Pathology showed invasive mammary carcinoma, negative margin, sentinel lymph nodes negative 0/5,  Grade 2, ER positive, PR negative HER-2 negative[performed on previous biopsy].Lymphovascular invasion: Not identified  Patient developed left breast abscess and infection status post I&D and debridement on 04/28/2018 # 06/19/2018 wound infection. Surgical wound culture positive for MRSA, as well as enterococcus faecalis. Patient was discharged on 06/22/2018.  Oncotype DX recurrence score came back at 27, predicting distant recurrence risk at 9 years with tamoxifen alone around 16%.  Group average absolute chemotherapy benefit >15%.  Patient declined chemotherapy.   # finished adjuvant radiation-last RT 09/27/2018 #Started on adjuvant antiestrogen treatment with Arimidex 1 mg daily since June 2020.  #Osteopenic, received a dental clearance  for Zometa.  Started on Zometa 4 mg every 6 months.  Received last dose on 11/16/2018.  INTERVAL HISTORY Jackie Allison is a 79 y.o. female who has above history reviewed by me today presents for follow up visit after recent ER visit  patient being off Arimidex for 3 weeks.  She feels that her severe bone pain has not improved. Continues to have worsening of bilateral lower extremity edema,  Patient had been taking Arimidex 1 mg daily, due to severe bone pain, I have asked her to stop. She had been on Arimidex since June 2020, previously doing all right.  Lately have developed bone pain. Patient presented to emergency room on 04/29/2019 for evaluation of weakness and decreased appetite. Her hemoglobin was 8.5.  Patient was given 1 dose of IV iron in the emergency room.  Her other work-up was reassuring and patient was discharged for outpatient follow-up.  Today patient continues to feel very tired and fatigued.  She has not felt any improvement after IV iron infusion. Continues to have leg edema.  Denies any shortness of breath, cough. Reviewed her past medical records, patient was previously seen by cardiology in December 2019 for bilateral lower extremity edema.  Patient had echocardiogram done at [facility] which showed normal LV EF 55%.  Patient reports that her lower extremity edema resolved now it recurred again. 04/06/2019, [place] bilateral lower extremity ultrasound negative for DVT. Patient takes Bumex 1 mg daily.  Review of Systems  Constitutional: Positive for fatigue. Negative for appetite change, chills and fever.  HENT:   Negative for hearing loss and voice change.   Eyes: Negative for eye problems.  Respiratory: Negative for chest tightness and cough.   Cardiovascular: Positive for leg swelling. Negative for chest pain.  Gastrointestinal: Negative  for abdominal distention, abdominal pain and blood in stool.  Endocrine: Negative for hot flashes.  Genitourinary: Negative for  difficulty urinating and frequency.   Musculoskeletal: Positive for arthralgias.       Chronic back pain Generalized bone pain  Skin: Negative for itching and rash.  Neurological: Negative for extremity weakness.  Hematological: Negative for adenopathy.  Psychiatric/Behavioral: Negative for confusion.    MEDICAL HISTORY:  Past Medical History:  Diagnosis Date  . Anemia    chronic microcytic anemia  . Breast abscess 06/20/2018  . Breast cancer ([place]) 03/2018   Invasive Mammary  . Cancer ([place]) 2019   left breast  . Diabetes mellitus without complication ([place])   . GERD (gastroesophageal reflux disease)   . Glaucoma   . Hypercholesterolemia   . Hypertension     SURGICAL HISTORY: Past Surgical History:  Procedure Laterality Date  . BACK SURGERY    . BREAST BIOPSY Left 02/2018  . BREAST EXCISIONAL BIOPSY Right ??  . BREAST LUMPECTOMY Left 04/03/2018   Invasive Mammary CA Neg margins  . CATARACT EXTRACTION W/ INTRAOCULAR LENS  IMPLANT, BILATERAL    . EYE SURGERY Bilateral    cataract extraction  . INCISION AND DRAINAGE ABSCESS Left 04/28/2018   Procedure: INCISION AND DRAINAGE ABSCESS;  Surgeon: Benjamine Sprague, DO;  Location: [facility] ORS;  Service: General;  Laterality: Left;  . INCISION AND DRAINAGE ABSCESS Left 06/20/2018   Procedure: INCISION AND DRAINAGE ABSCESS;  Surgeon: Herbert Pun, MD;  Location: [facility] ORS;  Service: General;  Laterality: Left;  . LUMBAR FUSION  2002?  . LUMBAR LAMINECTOMY  1997   titanium in back  . PARTIAL MASTECTOMY WITH AXILLARY SENTINEL LYMPH NODE BIOPSY Left 04/13/2018   Procedure: PARTIAL MASTECTOMY WITH AXILLARY SENTINEL LYMPH NODE BIOPSY  (No Needle Loc);  Surgeon: Benjamine Sprague, DO;  Location: [facility] ORS;  Service: General;  Laterality: Left;  . RETINAL DETACHMENT SURGERY Bilateral 2009  . TUBAL LIGATION      SOCIAL HISTORY: Social History   Socioeconomic History  . Marital status: Married    Spouse name: [name]  . Number of children:  Not on file  . Years of education: Not on file  . Highest education level: Not on file  Occupational History  . Occupation: teacher/real estate  Tobacco Use  . Smoking status: Never Smoker  . Smokeless tobacco: Never Used  Substance and Sexual Activity  . Alcohol use: Never  . Drug use: Never  . Sexual activity: Not on file  Other Topics Concern  . Not on file  Social History Narrative  . Not on file   Social Determinants of Health   Financial Resource Strain:   . Difficulty of Paying Living Expenses: Not on file  Food Insecurity:   . Worried About Charity fundraiser in the Last Year: Not on file  . Ran Out of Food in the Last Year: Not on file  Transportation Needs:   . Lack of Transportation (Medical): Not on file  . Lack of Transportation (Non-Medical): Not on file  Physical Activity:   . Days of Exercise per Week: Not on file  . Minutes of Exercise per Session: Not on file  Stress:   . Feeling of Stress : Not on file  Social Connections:   . Frequency of Communication with Friends and Family: Not on file  . Frequency of Social Gatherings with Friends and Family: Not on file  . Attends Religious Services: Not on file  . Active Member of Clubs  or Organizations: Not on file  . Attends Archivist Meetings: Not on file  . Marital Status: Not on file  Intimate Partner Violence:   . Fear of Current or Ex-Partner: Not on file  . Emotionally Abused: Not on file  . Physically Abused: Not on file  . Sexually Abused: Not on file    FAMILY HISTORY: Family History  Problem Relation Age of Onset  . Diabetes Sister   . Diabetes Brother   . Diabetes Sister   . Breast cancer Other   . Hypertension Mother   . Glaucoma Mother   . Heart attack Father     ALLERGIES:  is allergic to shellfish allergy; iodine; and sulfa antibiotics.  MEDICATIONS:  Current Outpatient Medications  Medication Sig Dispense Refill  . acetaminophen (TYLENOL) 500 MG tablet Take 500 mg  by mouth every 6 (six) hours as needed for moderate pain.     . bumetanide (BUMEX) 1 MG tablet Take 1 mg by mouth daily.    . hydrALAZINE (APRESOLINE) 100 MG tablet Take 100 mg by mouth 3 (three) times daily.   3  . Multiple Vitamin (MULTI-VITAMINS) TABS Take 1 tablet by mouth daily.     . potassium chloride SA (KLOR-CON) 20 MEQ tablet Take 20 mEq by mouth daily.    Marland Kitchen anastrozole (ARIMIDEX) 1 MG tablet Take 1 tablet (1 mg total) by mouth daily. (Patient not taking: Reported on 05/04/2019) 90 tablet 1  . ferrous sulfate 325 (65 FE) MG EC tablet TAKE 1 TABLET (325 MG TOTAL) BY MOUTH 2 (TWO) TIMES DAILY WITH A MEAL. (Patient not taking: Reported on 05/04/2019) 60 tablet 1   No current facility-administered medications for this visit.     PHYSICAL EXAMINATION: ECOG PERFORMANCE STATUS: 1 - Symptomatic but completely ambulatory Vitals:   05/04/19 1449  BP: (!) 154/80  Pulse: 73  Resp: 20  Temp: (!) 97.5 F (36.4 C)   There were no vitals filed for this visit.  Physical Exam Constitutional:      General: She is not in acute distress. HENT:     Head: Normocephalic and atraumatic.  Eyes:     General: No scleral icterus.    Pupils: Pupils are equal, round, and reactive to light.  Cardiovascular:     Rate and Rhythm: Normal rate and regular rhythm.     Heart sounds: Normal heart sounds.  Pulmonary:     Effort: Pulmonary effort is normal. No respiratory distress.     Breath sounds: No wheezing.  Abdominal:     General: Bowel sounds are normal. There is no distension.     Palpations: Abdomen is soft. There is no mass.     Tenderness: There is no abdominal tenderness.  Musculoskeletal:        General: No deformity. Normal range of motion.     Cervical back: Normal range of motion and neck supple.  Skin:    General: Skin is warm and dry.     Findings: No erythema or rash.  Neurological:     Mental Status: She is alert and oriented to person, place, and time.     Cranial Nerves: No  cranial nerve deficit.     Coordination: Coordination normal.  Psychiatric:        Mood and Affect: Mood normal.     LABORATORY DATA:  I have reviewed the data as listed Lab Results  Component Value Date   WBC 3.0 (L) 05/04/2019   HGB 9.0 (L) 05/04/2019  HCT 28.2 (L) 05/04/2019   MCV 76.8 (L) 05/04/2019   PLT 160 05/04/2019   Recent Labs    04/13/19 1204 04/29/19 0858 05/04/19 1538  NA 134* 132* 129*  K 3.2* 3.6 3.5  CL 101 99 96*  CO2 23 20* 24  GLUCOSE 131* 150* 114*  BUN _0 CREATININE 0.62 0.62 0.78  CALCIUM 9.1 9.3 10.0  GFRNONAA >60 >60 >60  GFRAA >60 >60 >60  PROT 6.9 7.0 7.6  ALBUMIN 2.9* 2.8* 3.0*  AST _1 ALT _2 ALKPHOS 58 63 61  BILITOT 1.0 0.8 0.7   Iron/TIBC/Ferritin/ %Sat    Component Value Date/Time   IRON 35 04/29/2019 0858   TIBC 183 (L) 04/29/2019 0858   FERRITIN 162 04/29/2019 0858   IRONPCTSAT 19 04/29/2019 0858     RADIOGRAPHIC STUDIES: I have personally reviewed the radiological images as listed and agreed with the findings in the report. 03/11/2018 Diagnostic Mammogram and [place]. A radiopaque BB was placed at the site of the patient's palpable lump in the lower inner left breast at far posterior depth. A spiculated hyperdense mass is seen deep to the radiopaque BB along the chest wall. No additional suspicious findings are identified within either breast. Further evaluation with ultrasound was performed. Mammographic images were processed with CAD.  On physical exam, I palpate a firm, fixed 2-3 cm mass along the inframammary fold of the 7 o'clock position. Targeted ultrasound is performed, showing an irregular hypoechoic mass with posterior acoustic shadowing at the 7:30 position 11 cm from the nipple. It measures 2.7 x 2.5 x 2.0 cm. There is associated vascularity. This correlates well with the mammographic finding. Evaluation of the axilla demonstrates a single axillary lymph node with borderline cortical thickening of  0.4 cm. IMPRESSION: 1. Highly suspicious left breast mass corresponding with the patient's palpable lump. Recommendation is for ultrasound-guided biopsy. 2. Indeterminate left axillary lymph node. Recommendation is for ultrasound-guided biopsy.  Same as pre-op    ASSESSMENT & PLAN:  1. Cardiomegaly   2. Microcytic anemia   3. History of breast cancer   4. Leg edema   5. Osteopenia, unspecified location   6. Bone pain   7. Lymphopenia   Cancer Staging Breast cancer in female [facility]) Staging form: Breast, AJCC 8th Edition - Clinical: No stage assigned - Unsigned - Pathologic stage from 06/11/2018: Stage IIA (pT2, pN0, cM0, G2, ER+, PR-, HER2-, Oncotype DX score: 27) - Signed by Earlie Server, MD on 06/11/2018  # Stage IIA pT2 pN0 ER positive invasive mammary carcinoma. Arimidex has been on hold due to generalized bone pain.  Her symptom has not improved.  Continue to hold for now.  #Microcytic anemia, iron panel is more consistent with anemia secondary to chronic disease.I am not convinced that she has iron deficiency. Although she did receive 1 dose of IV Feraheme in the emergency room on 04/28/2018.  She is chronically microcytic, and hemoglobinopathy evaluation was normal.  I suspect that she has alpha thalassemia which may show normal hemoglobinopathy evaluation pattern.  We will send alpha thalassemia confirmatory test at the next visit. Repeat CBC today.- showed slightly improved hemoglobin, normal retic hemoglobin indicating adequate iron, inappropriate normal reticulocyte count,   #Lymphocytopenia, peripheral blood flow cytometry showed no significant immunophenotypic abnormality. I have discussed obtaining a bone marrow biopsy if lymphocytopenia persists.  Patient is reluctant and would like to wait and rediscuss at the next visit.  #Worsening of lower extremity edema.  Unknown etiology.  Cardiology versus lower extremity vein insufficiency. Patient has increased BNP, mild cardiomegaly  on chest x-ray.  I will obtain a 2D echo.  # Osteopenia, continue calcium and vitamin D supplements.DEXA 11/11/2018 findings revealed osteopenia.  Recommend bisphosphate treatment with Zometa Q6 months.- Patient has an appointment today for Zometa treatment.  She declined.   #Bone pain, 03/04/2019 bone scan whole body findings fever degenerated changes or related to previous surgery.  No worrisome findings for osseous metastatic disease. #Hyponatremia, etiology unknown.  Likely secondary to generalized edema.  Return of visit: Follow-up 2 weeks  Orders Placed This Encounter  Procedures  . ECHOCARDIOGRAM COMPLETE    Standing Status:   Future    Standing Expiration Date:   08/01/2020    Order Specific Question:   Where should this test be performed    Answer:   [facility]    Order Specific Question:   Perflutren DEFINITY (image enhancing agent) should be administered unless hypersensitivity or allergy exist    Answer:   Administer Perflutren    Order Specific Question:   Is a special reader required? (athlete or structural heart)    Answer:   No    Order Specific Question:   Reason for exam-Echo    Answer:   Cardiomegaly  429.3 / I51.7    Order Specific Question:   Other Comments    Answer:   leg edema, elevated BNP      Earlie Server, MD, PhD  05/04/2019

## 2019-05-05 ENCOUNTER — Telehealth: Payer: Self-pay

## 2019-05-05 NOTE — Telephone Encounter (Signed)
Message sent by Dr. Tasia Catchings: "please arrange her to do labs in 2 weeks, and follow up with me in person. thanks. labs are ordered. same day is fine."

## 2019-05-05 NOTE — Telephone Encounter (Signed)
Jackie Allison...  Pts appt has been scheduled as requested. She is aware or the scheduled 05/19/19  Lab/MD (In Person) visit).

## 2019-05-06 ENCOUNTER — Telehealth: Payer: Self-pay | Admitting: *Deleted

## 2019-05-06 NOTE — Telephone Encounter (Signed)
Contacted pt regarding referral. She wants [facility] referral for the breast cancer. Pt requests to cancel scheduled appt on 1/2. Will send message to scheduling to cancel. Will work on referral as well.

## 2019-05-06 NOTE — Telephone Encounter (Signed)
Patient called requesting a referral to [facility] for a second opinion

## 2019-05-09 ENCOUNTER — Ambulatory Visit (INDEPENDENT_AMBULATORY_CARE_PROVIDER_SITE_OTHER): Payer: Medicare Other | Admitting: Vascular Surgery

## 2019-05-11 ENCOUNTER — Ambulatory Visit: Admission: RE | Admit: 2019-05-11 | Payer: Medicare Other | Source: Ambulatory Visit

## 2019-05-11 NOTE — Telephone Encounter (Signed)
Referral for second opinion faxed to [facility]. Fax confirmation received   Ph# [phone] Fx# [phone]

## 2019-05-12 ENCOUNTER — Ambulatory Visit (INDEPENDENT_AMBULATORY_CARE_PROVIDER_SITE_OTHER): Payer: Medicare Other | Admitting: Vascular Surgery

## 2019-05-12 ENCOUNTER — Other Ambulatory Visit: Payer: Self-pay

## 2019-05-12 ENCOUNTER — Encounter (INDEPENDENT_AMBULATORY_CARE_PROVIDER_SITE_OTHER): Payer: Self-pay | Admitting: Vascular Surgery

## 2019-05-12 VITALS — BP 159/91 | HR 12 | Resp 12 | Ht 67.0 in | Wt 147.0 lb

## 2019-05-12 DIAGNOSIS — E119 Type 2 diabetes mellitus without complications: Secondary | ICD-10-CM | POA: Diagnosis not present

## 2019-05-12 DIAGNOSIS — Z170 Estrogen receptor positive status [ER+]: Secondary | ICD-10-CM

## 2019-05-12 DIAGNOSIS — E7800 Pure hypercholesterolemia, unspecified: Secondary | ICD-10-CM | POA: Diagnosis not present

## 2019-05-12 DIAGNOSIS — I10 Essential (primary) hypertension: Secondary | ICD-10-CM | POA: Diagnosis not present

## 2019-05-12 DIAGNOSIS — I890 Lymphedema, not elsewhere classified: Secondary | ICD-10-CM

## 2019-05-12 DIAGNOSIS — Z862 Personal history of diseases of the blood and blood-forming organs and certain disorders involving the immune mechanism: Secondary | ICD-10-CM | POA: Insufficient documentation

## 2019-05-12 DIAGNOSIS — K219 Gastro-esophageal reflux disease without esophagitis: Secondary | ICD-10-CM | POA: Insufficient documentation

## 2019-05-12 DIAGNOSIS — Z8719 Personal history of other diseases of the digestive system: Secondary | ICD-10-CM | POA: Insufficient documentation

## 2019-05-12 DIAGNOSIS — C50912 Malignant neoplasm of unspecified site of left female breast: Secondary | ICD-10-CM

## 2019-05-12 MED ORDER — ESZOPICLONE 1 MG PO TABS: 1.0000 mg | ORAL_TABLET | Freq: Every evening | ORAL | 0 refills | Status: DC | PRN

## 2019-05-12 NOTE — Progress Notes (Signed)
MRN : [id number]  Jackie Allison is a 79 y.o. (25-Dec-1940) female who presents with chief complaint of  Chief Complaint  Patient presents with  . Follow-up    linthavong. BLE edma SEE GS  .  History of Present Illness: Patient is seen for evaluation of leg pain and leg swelling.  She has been seen a long time ago for the same condition.  However, her primary concern and complaint is diffuse pain throughout her entire body she is under the impression that is the reason she has been sent to see me.  She is complaining of pain in the shoulders in the hands and wrists and the hips and knees and legs.  She states the pain is continuous.  She states the pain is excruciating that she cannot sleep.  She denies any alleviating factors.  She says she is just suffering continuously and this all started with her treatment for breast carcinoma.  The patient first noticed the swelling remotely. The swelling is associated with pain and discoloration. The pain and swelling worsens with prolonged dependency and improves with elevation. The pain is unrelated to activity.  The patient notes that in the morning the legs are significantly improved but they steadily worsened throughout the course of the day. The patient also notes a steady worsening of the discoloration in the ankle and shin area.   The patient denies claudication symptoms.  The patient denies symptoms consistent with rest pain.  The patient denies and extensive history of DJD and LS spine disease.  The patient has no had any past angiography, interventions or vascular surgery.  Elevation makes the leg symptoms better, dependency makes them much worse. There is no history of ulcerations. The patient denies any recent changes in medications.  The patient has not been wearing graduated compression.  She states she can put them on  The patient denies a history of DVT or PE. There is no prior history of phlebitis. There is no history of primary  lymphedema.  No history of malignancies. No history of trauma or groin or pelvic surgery. There is no history of radiation treatment to the groin or pelvis  The patient denies amaurosis fugax or recent TIA symptoms. There are no recent neurological changes noted. The patient denies recent episodes of angina or shortness of breath  Current Meds  Medication Sig  . acetaminophen (TYLENOL) 500 MG tablet Take 500 mg by mouth every 6 (six) hours as needed for moderate pain.   Marland Kitchen anastrozole (ARIMIDEX) 1 MG tablet Take 1 tablet (1 mg total) by mouth daily.  . bumetanide (BUMEX) 1 MG tablet Take 1 mg by mouth daily.  . ferrous sulfate 325 (65 FE) MG EC tablet TAKE 1 TABLET (325 MG TOTAL) BY MOUTH 2 (TWO) TIMES DAILY WITH A MEAL.  . hydrALAZINE (APRESOLINE) 100 MG tablet Take 100 mg by mouth 3 (three) times daily.   . Multiple Vitamin (MULTI-VITAMINS) TABS Take 1 tablet by mouth daily.   . potassium chloride SA (KLOR-CON) 20 MEQ tablet Take 20 mEq by mouth daily.    Past Medical History:  Diagnosis Date  . Anemia    chronic microcytic anemia  . Breast abscess 06/20/2018  . 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 Cancer ([place]) 2019   left breast  . Diabetes mellitus without complication ([place])   . GERD (gastroesophageal reflux disease)   . Glaucoma   . Hypercholesterolemia   . Hypertension     Past Surgical  History:  Procedure Laterality Date  . BACK SURGERY    . BREAST BIOPSY Left 02/2018  . BREAST EXCISIONAL BIOPSY Right ??  . BREAST LUMPECTOMY Left 04/03/2018   Invasive Mammary CA Neg margins  . CATARACT EXTRACTION W/ INTRAOCULAR LENS  IMPLANT, BILATERAL    . EYE SURGERY Bilateral    cataract extraction  . INCISION AND DRAINAGE ABSCESS Left 04/28/2018   Procedure: INCISION AND DRAINAGE ABSCESS;  Surgeon: Benjamine Sprague, DO;  Location: [facility] ORS;  Service: General;  Laterality: Left;  . INCISION AND DRAINAGE ABSCESS Left 06/20/2018   Procedure: INCISION AND DRAINAGE ABSCESS;   Surgeon: Herbert Pun, MD;  Location: [facility] ORS;  Service: General;  Laterality: Left;  . LUMBAR FUSION  2002?  . LUMBAR LAMINECTOMY  1997   titanium in back  . PARTIAL MASTECTOMY WITH AXILLARY SENTINEL LYMPH NODE BIOPSY Left 04/13/2018   Procedure: PARTIAL MASTECTOMY WITH AXILLARY SENTINEL LYMPH NODE BIOPSY  (No Needle Loc);  Surgeon: Benjamine Sprague, DO;  Location: [facility] ORS;  Service: General;  Laterality: Left;  . RETINAL DETACHMENT SURGERY Bilateral 2009  . TUBAL LIGATION      Social History Social History   Tobacco Use  . Smoking status: Never Smoker  . Smokeless tobacco: Never Used  Substance Use Topics  . Alcohol use: Never  . Drug use: Never    Family History Family History  Problem Relation Age of Onset  . Diabetes Sister   . Diabetes Brother   . Diabetes Sister   . Breast cancer Other   . Hypertension Mother   . Glaucoma Mother   . Heart attack Father   No family history of bleeding/clotting disorders, porphyria or autoimmune disease   Allergies  Allergen Reactions  . Shellfish Allergy Anaphylaxis  . Iodine     Patient unsure if allergic to topical betadine Shellfish allergy with ingestion  . Sulfa Antibiotics Itching     REVIEW OF SYSTEMS (Negative unless checked)  Constitutional: [] Weight loss  [] Fever  [] Chills Cardiac: [] Chest pain   [] Chest pressure   [] Palpitations   [] Shortness of breath when laying flat   [] Shortness of breath with exertion. Vascular:  [] Pain in legs with walking   [x] Pain in legs at rest  [] History of DVT   [] Phlebitis   [x] Swelling in legs   [] Varicose veins   [] Non-healing ulcers Pulmonary:   [] Uses home oxygen   [] Productive cough   [] Hemoptysis   [] Wheeze  [] COPD   [] Asthma Neurologic:  [] Dizziness   [] Seizures   [] History of stroke   [] History of TIA  [] Aphasia   [] Vissual changes   [] Weakness or numbness in arm   [] Weakness or numbness in leg Musculoskeletal:   [] Joint swelling   [] Joint pain   [] Low back  pain Hematologic:  [] Easy bruising  [] Easy bleeding   [] Hypercoagulable state   [] Anemic Gastrointestinal:  [] Diarrhea   [] Vomiting  [] Gastroesophageal reflux/heartburn   [] Difficulty swallowing. Genitourinary:  [] Chronic kidney disease   [] Difficult urination  [] Frequent urination   [] Blood in urine Skin:  [] Rashes   [] Ulcers  Psychological:  [] History of anxiety   []  History of major depression.  Physical Examination  Vitals:   05/12/19 1029  BP: (!) 159/91  Pulse: (!) 12  Resp: 12  Weight: 147 lb (66.7 kg)  Height: 5\' 7"  (1.702 m)   Body mass index is 23.02 kg/m. Gen: WD/WN, NAD Head: [place]/AT, No temporalis wasting.  Ear/Nose/Throat: Hearing grossly intact, nares w/o erythema or drainage, poor dentition Eyes: PER, EOMI, sclera  nonicteric.  Neck: Supple, no masses.  No bruit or JVD.  Pulmonary:  Good air movement, clear to auscultation bilaterally, no use of accessory muscles.  Cardiac: RRR, normal S1, S2, no Murmurs. Vascular: scattered varicosities present bilaterally.  Mild venous stasis changes to the legs bilaterally.  3+ soft pitting edema Gastrointestinal: soft, non-distended. No guarding/no peritoneal signs.  Musculoskeletal: M/S 5/5 throughout.  No deformity or atrophy.  Neurologic: CN 2-12 intact. Pain and light touch intact in extremities.  Symmetrical.  Speech is fluent. Motor exam as listed above. Psychiatric: Judgment intact, Mood & affect appropriate for pt's clinical situation. Dermatologic: No rashes or ulcers noted.  No changes consistent with cellulitis. Lymph : No Cervical lymphadenopathy, no lichenification or skin changes of chronic lymphedema.  CBC Lab Results  Component Value Date   WBC 3.0 (L) 05/04/2019   HGB 9.0 (L) 05/04/2019   HCT 28.2 (L) 05/04/2019   MCV 76.8 (L) 05/04/2019   PLT 160 05/04/2019    BMET    Component Value Date/Time   NA 129 (L) 05/04/2019 1538   K 3.5 05/04/2019 1538   CL 96 (L) 05/04/2019 1538   CO2 24 05/04/2019 1538    GLUCOSE 114 (H) 05/04/2019 1538   BUN 15 05/04/2019 1538   CREATININE 0.78 05/04/2019 1538   CALCIUM 10.0 05/04/2019 1538   GFRNONAA >60 05/04/2019 1538   GFRAA >60 05/04/2019 1538   Estimated Creatinine Clearance: 54.5 mL/min (by C-G formula based on SCr of 0.78 mg/dL).  COAG Lab Results  Component Value Date   INR 1.10 06/20/2018   INR 0.99 04/24/2018    Radiology DG Chest Portable 1 View  Result Date: 04/29/2019 CLINICAL DATA:  Weakness EXAM: PORTABLE CHEST 1 VIEW COMPARISON:  04/13/2019 FINDINGS: Heart size is mildly enlarged, unchanged. Calcific aortic knob. No focal airspace consolidation, pleural effusion, or pneumothorax. IMPRESSION: No active disease. Electronically Signed   By: Davina Poke D.O.   On: 04/29/2019 11:00   DG Chest Port 1 View  Result Date: 04/13/2019 CLINICAL DATA:  Bilateral feet swelling for the past 1-2 months. Fell 1 week ago. EXAM: PORTABLE CHEST 1 VIEW COMPARISON:  06/19/2018 FINDINGS: The heart remains borderline enlarged. The aorta remains tortuous and partially calcified. Stable linear scarring at the left lung base and mild prominence of the pulmonary vasculature. Interval minimal prominence of the interstitial markings and small bilateral pleural effusions. Diffuse osteopenia and mild scoliosis. IMPRESSION: 1. Interval minimal changes of congestive heart failure. 2. Stable borderline cardiomegaly, pulmonary vascular congestion and left basilar linear scarring. Electronically Signed   By: Claudie Revering M.D.   On: 04/13/2019 14:12   DG Hip Unilat With Pelvis 2-3 Views Left  Result Date: 04/13/2019 CLINICAL DATA:  Left hip pain after fall last week. EXAM: DG HIP (WITH OR WITHOUT PELVIS) 2-3V LEFT COMPARISON:  None. FINDINGS: There is no evidence of hip fracture or dislocation. There is no evidence of arthropathy or other focal bone abnormality. IMPRESSION: Negative. Electronically Signed   By: Marijo Conception M.D.   On: 04/13/2019 14:10      Assessment/Plan 1. Lymphedema Recommend:  No surgery or intervention at this point in time.    I have reviewed my previous discussion with the patient regarding swelling and why it causes symptoms.  Patient will continue wearing graduated compression stockings class 1 (20-30 mmHg) on a daily basis. The patient will  beginning wearing the stockings first thing in the morning and removing them in the evening. The patient is instructed  specifically not to sleep in the stockings.    In addition, behavioral modification including several periods of elevation of the lower extremities during the day will be continued.  This was reviewed with the patient during the initial visit.  The patient will also continue routine exercise, especially walking on a daily basis as was discussed during the initial visit.    Despite conservative treatments including graduated compression therapy class 1 and behavioral modification including exercise and elevation the patient  has not obtained adequate control of the lymphedema.  The patient still has stage 3 lymphedema and therefore, I believe that a lymph pump should be added to improve the control of the patient's lymphedema.  Additionally, a lymph pump is warranted because it will reduce the risk of cellulitis and ulceration in the future.  Patient should follow-up in six months    2. Essential hypertension Continue antihypertensive medications as already ordered, these medications have been reviewed and there are no changes at this time.   3. Pure hypercholesterolemia Continue statin as ordered and reviewed, no changes at this time   4. Type 2 diabetes mellitus without complication, without long-term current use of insulin (HCC) Continue hypoglycemic medications as already ordered, these medications have been reviewed and there are no changes at this time.  Hgb A1C to be monitored as already arranged by primary service   5. Malignant neoplasm of  left breast in female, estrogen receptor positive, unspecified site of breast ([place]) I defer treatment of her pain to oncology or her primary as this is not related to her lymphedema.   Hortencia Pilar, MD  05/12/2019 10:37 AM

## 2019-05-17 ENCOUNTER — Encounter: Payer: Self-pay | Admitting: Emergency Medicine

## 2019-05-17 ENCOUNTER — Other Ambulatory Visit: Payer: Self-pay

## 2019-05-17 ENCOUNTER — Emergency Department
Admission: EM | Admit: 2019-05-17 | Discharge: 2019-05-17 | Disposition: A | Discharge status: Home / Self Care | Payer: Medicare Other | Attending: Emergency Medicine | Admitting: Emergency Medicine

## 2019-05-17 ENCOUNTER — Emergency Department: Payer: Medicare Other

## 2019-05-17 DIAGNOSIS — N3000 Acute cystitis without hematuria: Secondary | ICD-10-CM | POA: Insufficient documentation

## 2019-05-17 DIAGNOSIS — Z79899 Other long term (current) drug therapy: Secondary | ICD-10-CM | POA: Insufficient documentation

## 2019-05-17 DIAGNOSIS — I10 Essential (primary) hypertension: Secondary | ICD-10-CM | POA: Diagnosis not present

## 2019-05-17 DIAGNOSIS — R531 Weakness: Secondary | ICD-10-CM

## 2019-05-17 DIAGNOSIS — Z20822 Contact with and (suspected) exposure to covid-19: Secondary | ICD-10-CM

## 2019-05-17 DIAGNOSIS — M6281 Muscle weakness (generalized): Secondary | ICD-10-CM | POA: Diagnosis present

## 2019-05-17 DIAGNOSIS — E119 Type 2 diabetes mellitus without complications: Secondary | ICD-10-CM | POA: Insufficient documentation

## 2019-05-17 DIAGNOSIS — R197 Diarrhea, unspecified: Secondary | ICD-10-CM | POA: Insufficient documentation

## 2019-05-17 LAB — URINALYSIS, COMPLETE (UACMP) WITH MICROSCOPIC
Bacteria, UA: NONE SEEN
Bilirubin Urine: NEGATIVE
Glucose, UA: NEGATIVE mg/dL
Hgb urine dipstick: NEGATIVE
Ketones, ur: NEGATIVE mg/dL
Nitrite: NEGATIVE
Protein, ur: NEGATIVE mg/dL
Specific Gravity, Urine: 1.02 (ref 1.005–1.030)
pH: 5 (ref 5.0–8.0)

## 2019-05-17 LAB — COMPREHENSIVE METABOLIC PANEL
ALT: 19 U/L (ref 0–44)
AST: 28 U/L (ref 15–41)
Albumin: 2.7 g/dL — ABNORMAL LOW (ref 3.5–5.0)
Alkaline Phosphatase: 70 U/L (ref 38–126)
Anion gap: 11 (ref 5–15)
BUN: 12 mg/dL (ref 8–23)
CO2: 23 mmol/L (ref 22–32)
Calcium: 9.4 mg/dL (ref 8.9–10.3)
Chloride: 98 mmol/L (ref 98–111)
Creatinine, Ser: 0.69 mg/dL (ref 0.44–1.00)
GFR calc Af Amer: >60 mL/min (ref >60)
GFR calc non Af Amer: >60 mL/min (ref >60)
Glucose, Bld: 143 mg/dL — ABNORMAL HIGH (ref 70–99)
Potassium: 3.5 mmol/L (ref 3.5–5.1)
Sodium: 132 mmol/L — ABNORMAL LOW (ref 135–145)
Total Bilirubin: 0.9 mg/dL (ref 0.3–1.2)
Total Protein: 7.5 g/dL (ref 6.5–8.1)

## 2019-05-17 LAB — CBC
HCT: 28.3 % — ABNORMAL LOW (ref 36.0–46.0)
Hemoglobin: 9.2 g/dL — ABNORMAL LOW (ref 12.0–15.0)
MCH: 24.5 pg — ABNORMAL LOW (ref 26.0–34.0)
MCHC: 32.5 g/dL (ref 30.0–36.0)
MCV: 75.5 fL — ABNORMAL LOW (ref 80.0–100.0)
Platelets: 173 10*3/uL (ref 150–400)
RBC: 3.75 MIL/uL — ABNORMAL LOW (ref 3.87–5.11)
RDW: 18.4 % — ABNORMAL HIGH (ref 11.5–15.5)
WBC: 3.8 10*3/uL — ABNORMAL LOW (ref 4.0–10.5)
nRBC: 0 % (ref 0.0–0.2)

## 2019-05-17 LAB — POC SARS CORONAVIRUS 2 AG: SARS Coronavirus 2 Ag: NEGATIVE

## 2019-05-17 MED ORDER — LOPERAMIDE HCL 2 MG PO CAPS
2.0000 mg | ORAL_CAPSULE | Freq: Once | ORAL | Status: AC
  Administered 2019-05-17: 18:00:00 2 mg via ORAL
  Filled 2019-05-17: qty 1

## 2019-05-17 MED ORDER — LOPERAMIDE HCL 2 MG PO TABS: 2.0000 mg | ORAL_TABLET | Freq: Four times a day (QID) | ORAL | 0 refills | Status: DC | PRN

## 2019-05-17 MED ORDER — SODIUM CHLORIDE 0.9 % IV SOLN
1.0000 g | Freq: Once | INTRAVENOUS | Status: AC
  Administered 2019-05-17: 19:00:00 1 g via INTRAVENOUS
  Filled 2019-05-17: qty 10

## 2019-05-17 MED ORDER — LACTATED RINGERS IV BOLUS
1000.0000 mL | Freq: Once | INTRAVENOUS | Status: AC
  Administered 2019-05-17: 17:00:00 1000 mL via INTRAVENOUS

## 2019-05-17 MED ORDER — ACETAMINOPHEN 500 MG PO TABS
1000.0000 mg | ORAL_TABLET | Freq: Once | ORAL | Status: AC
  Administered 2019-05-17: 18:00:00 1000 mg via ORAL
  Filled 2019-05-17: qty 2

## 2019-05-17 MED ORDER — CEPHALEXIN 500 MG PO CAPS: 500.0000 mg | ORAL_CAPSULE | Freq: Two times a day (BID) | ORAL | 0 refills | Status: AC

## 2019-05-17 NOTE — ED Notes (Signed)
Mr. Jackie Allison called for transport of pt back to facility. Pt discharged and ER care explained as well as discharge. Husband in route to pick up pt. Pt placed in wheelchair and first nurse in know of husband in route.

## 2019-05-17 NOTE — ED Notes (Signed)
Patient is repositioned in bed, pulled up and blanket provided for comfort.

## 2019-05-17 NOTE — ED Provider Notes (Signed)
[facility] Emergency Department Provider Note   ____________________________________________   First MD Initiated Contact with Patient 05/17/19 1546     (approximate)  I have reviewed the triage vital signs and the nursing notes.   HISTORY  Chief Complaint Weakness and Diarrhea     HPI Jackie Allison is a 79 y.o. female with past medical history of breast cancer, hypertension, hyperlipidemia, and diabetes who presents to the ED complaining of weakness and diarrhea.  Patient reports she has had 3 days of diffuse body aches, malaise, and generalized weakness.  She states she has watery diarrhea whenever she eats something and has had a very poor appetite due to this, but denies any vomiting or abdominal pain.  She has not had any fevers and denies any sick contacts, but does endorse increased phlegm with a chronic cough.  She has not had any chest pain or shortness of breath.  She states she was tested for Covid and was negative last week but the symptoms did not come on until 3 days ago.        Past Medical History:  Diagnosis Date  . Anemia    chronic microcytic anemia  . Breast abscess 06/20/2018  . 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 Cancer ([place]) 2019   left breast  . Diabetes mellitus without complication ([place])   . GERD (gastroesophageal reflux disease)   . Glaucoma   . Hypercholesterolemia   . Hypertension     Patient Active Problem List   Diagnosis Date Noted  . Gastroesophageal reflux disease without esophagitis 05/12/2019  . History of Barrett's esophagus 05/12/2019  . History of microcytic hypochromic anemia 05/12/2019  . Pain due to onychomycosis of toenails of both feet 02/17/2019  . Osteopenia 11/01/2018  . Breast cancer in female [facility]) 04/28/2018  . Lymphedema 02/14/2018  . 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 Hyperlipidemia 02/11/2018  . Diabetes ([place]) 02/11/2018  . Chronic hyponatremia 01/11/2018  . Encounter  for general adult medical examination without abnormal findings 11/04/2016  . Aphakia of right eye 01/24/2016  . Primary open angle glaucoma of both eyes, indeterminate stage 01/24/2016  . Pseudophakia, left eye 01/24/2016  . Essential hypertension 11/02/2015  . Pure hypercholesterolemia 11/02/2015    Past Surgical History:  Procedure Laterality Date  . BACK SURGERY    . BREAST BIOPSY Left 02/2018  . BREAST EXCISIONAL BIOPSY Right ??  . BREAST LUMPECTOMY Left 04/03/2018   Invasive Mammary CA Neg margins  . CATARACT EXTRACTION W/ INTRAOCULAR LENS  IMPLANT, BILATERAL    . EYE SURGERY Bilateral    cataract extraction  . INCISION AND DRAINAGE ABSCESS Left 04/28/2018   Procedure: INCISION AND DRAINAGE ABSCESS;  Surgeon: Benjamine Sprague, DO;  Location: [facility] ORS;  Service: General;  Laterality: Left;  . INCISION AND DRAINAGE ABSCESS Left 06/20/2018   Procedure: INCISION AND DRAINAGE ABSCESS;  Surgeon: Herbert Pun, MD;  Location: [facility] ORS;  Service: General;  Laterality: Left;  . LUMBAR FUSION  2002?  . LUMBAR LAMINECTOMY  1997   titanium in back  . PARTIAL MASTECTOMY WITH AXILLARY SENTINEL LYMPH NODE BIOPSY Left 04/13/2018   Procedure: PARTIAL MASTECTOMY WITH AXILLARY SENTINEL LYMPH NODE BIOPSY  (No Needle Loc);  Surgeon: Benjamine Sprague, DO;  Location: [facility] ORS;  Service: General;  Laterality: Left;  . RETINAL DETACHMENT SURGERY Bilateral 2009  . TUBAL LIGATION      Prior to Admission medications   Medication Sig Start Date End Date Taking? Authorizing Provider  acetaminophen (TYLENOL) 500 MG tablet Take 500 mg by mouth every 6 (six) hours as needed for moderate pain.  11/04/16   [provider]  anastrozole (ARIMIDEX) 1 MG tablet Take 1 tablet (1 mg total) by mouth daily. 04/01/19   Earlie Server, MD  bumetanide (BUMEX) 1 MG tablet Take 1 mg by mouth daily. 04/15/19 04/14/20  [provider]  cephALEXin (KEFLEX) 500 MG capsule Take 1 capsule (500 mg total) by mouth 2 (two)  times daily for 7 days. 05/17/19 05/24/19  Blake Divine, MD  eszopiclone (LUNESTA) 1 MG TABS tablet Take 1 tablet (1 mg total) by mouth at bedtime as needed for sleep. Take immediately before bedtime 05/12/19   Schnier, Dolores Lory, MD  ferrous sulfate 325 (65 FE) MG EC tablet TAKE 1 TABLET (325 MG TOTAL) BY MOUTH 2 (TWO) TIMES DAILY WITH A MEAL. 02/03/19   Earlie Server, MD  hydrALAZINE (APRESOLINE) 100 MG tablet Take 100 mg by mouth 3 (three) times daily.  02/04/18   [provider]  loperamide (IMODIUM A-D) 2 MG tablet Take 1 tablet (2 mg total) by mouth 4 (four) times daily as needed for diarrhea or loose stools. 05/17/19   Blake Divine, MD  Multiple Vitamin (MULTI-VITAMINS) TABS Take 1 tablet by mouth daily.     [provider]  potassium chloride SA (KLOR-CON) 20 MEQ tablet Take 20 mEq by mouth daily. 04/15/19 04/14/20  [provider]    Allergies Shellfish allergy, Iodine, and Sulfa antibiotics  Family History  Problem Relation Age of Onset  . Diabetes Sister   . Diabetes Brother   . Diabetes Sister   . Breast cancer Other   . Hypertension Mother   . Glaucoma Mother   . Heart attack Father     Social History Social History   Tobacco Use  . Smoking status: Never Smoker  . Smokeless tobacco: Never Used  Substance Use Topics  . Alcohol use: Never  . Drug use: Never    Review of Systems  Constitutional: No fever/chills.  Positive for malaise. Eyes: No visual changes. ENT: No sore throat. Cardiovascular: Denies chest pain. Respiratory: Denies shortness of breath.  Positive for cough. Gastrointestinal: No abdominal pain.  No nausea, no vomiting.  Positive for diarrhea.  No constipation. Genitourinary: Negative for dysuria. Musculoskeletal: Negative for back pain.  Positive for myalgias. Skin: Negative for rash. Neurological: Negative for headaches, focal weakness or numbness.  ____________________________________________   PHYSICAL  EXAM:  VITAL SIGNS: ED Triage Vitals  Enc Vitals Group     BP 05/17/19 1501 (!) 157/70     Pulse Rate 05/17/19 1501 82     Resp 05/17/19 1501 16     Temp 05/17/19 1501 98.3 F (36.8 C)     Temp Source 05/17/19 1501 Oral     SpO2 05/17/19 1501 100 %     Weight 05/17/19 1458 147 lb (66.7 kg)     Height 05/17/19 1458 5\' 7"  (1.702 m)     Head Circumference --      Peak Flow --      Pain Score 05/17/19 1458 8     Pain Loc --      Pain Edu? --      Excl. in [place]? --     Constitutional: Alert and oriented. Eyes: Conjunctivae are normal. Head: Atraumatic. Nose: No congestion/rhinnorhea. Mouth/Throat: Mucous membranes are moist. Neck: Normal ROM Cardiovascular: Normal rate, regular rhythm. Grossly normal heart sounds. Respiratory: Normal respiratory effort.  No retractions.  Lungs CTAB. Gastrointestinal: Soft and nontender. No distention. Genitourinary: deferred Musculoskeletal: No lower extremity tenderness nor edema. Neurologic:  Normal speech and language. No gross focal neurologic deficits are appreciated. Skin:  Skin is warm, dry and intact. No rash noted. Psychiatric: Mood and affect are normal. Speech and behavior are normal.  ____________________________________________   LABS (all labs ordered are listed, but only abnormal results are displayed)  Labs Reviewed  CBC - Abnormal; Notable for the following components:      Result Value   WBC 3.8 (*)    RBC 3.75 (*)    Hemoglobin 9.2 (*)    HCT 28.3 (*)    MCV 75.5 (*)    MCH 24.5 (*)    RDW 18.4 (*)    All other components within normal limits  URINALYSIS, COMPLETE (UACMP) WITH MICROSCOPIC - Abnormal; Notable for the following components:   Color, Urine YELLOW (*)    APPearance HAZY (*)    Leukocytes,Ua MODERATE (*)    All other components within normal limits  COMPREHENSIVE METABOLIC PANEL - Abnormal; Notable for the following components:   Sodium 132 (*)    Glucose, Bld 143 (*)    Albumin 2.7 (*)    All other  components within normal limits  URINE CULTURE  NOVEL CORONAVIRUS, NAA (HOSP ORDER, SEND-OUT TO REF LAB; TAT 18-24 HRS)  POC SARS CORONAVIRUS 2 AG -  ED  POC SARS CORONAVIRUS 2 AG   ____________________________________________  EKG  ED ECG REPORT I, Blake Divine, the attending physician, personally viewed and interpreted this ECG.   Date: 05/17/2019  EKG Time: 15:00  Rate: 85  Rhythm: normal sinus rhythm  Axis: Normal  Intervals:none  ST&T Change: None   PROCEDURES  Procedure(s) performed (including Critical Care):  Procedures   ____________________________________________   INITIAL IMPRESSION / ASSESSMENT AND PLAN / ED COURSE       79 year old female presents to the ED with 3 days of generalized weakness, diffuse myalgias, and diarrhea whenever she goes to eat something.  She is not aware of any sick contacts, and denies any respiratory symptoms outside of a cough, but given this constellation of symptoms I would be concerned for COVID-19 versus influenza.  Lab work thus far has been unremarkable and EKG without evidence of arrhythmia or ischemia.  Will check chest x-ray, UA also borderline for infection, will send culture.  We will also perform point-of-care COVID-19 testing.  In the meantime, we will treat symptomatically with IV fluids, Tylenol, and loperamide.  No abdominal tenderness to necessitate imaging.  Chest x-ray negative for acute process, point-of-care COVID-19 testing is negative, will check PCR.  Patient now feeling better following IV fluids, dose of Tylenol and loperamide.  Patient now endorsing urinary symptoms, will treat with Rocephin and she would be appropriate for outpatient p.o. antibiotics.  I have counseled her to follow-up with her PCP and return to the ED for new or worsening symptoms.  Patient agrees with plan.      ____________________________________________   FINAL CLINICAL IMPRESSION(S) / ED DIAGNOSES  Final diagnoses:  Generalized  weakness  Acute cystitis without hematuria  Diarrhea, unspecified type  Suspected COVID-19 virus infection     ED Discharge Orders         Ordered    cephALEXin (KEFLEX) 500 MG capsule  2 times daily     05/17/19 2043    loperamide (IMODIUM A-D) 2 MG tablet  4 times daily PRN     05/17/19 2043  Note:  This document was prepared using Dragon voice recognition software and may include unintentional dictation errors.   Blake Divine, MD 05/18/19 [phone]

## 2019-05-17 NOTE — ED Notes (Signed)
Pt husband updated on plan of care for pt.

## 2019-05-17 NOTE — ED Notes (Signed)
ED Provider at bedside. 

## 2019-05-17 NOTE — ED Triage Notes (Signed)
Pt here for generalized weakness, body aches, diarrhea. No specific abdominal pain. No vomiting. Decreased appetite.  No loss taste or smell.  No chest pain.  No cough or fever. No SHOB.

## 2019-05-19 ENCOUNTER — Ambulatory Visit: Payer: Medicare Other | Admitting: Podiatry

## 2019-05-19 ENCOUNTER — Ambulatory Visit: Payer: Medicare Other | Admitting: Oncology

## 2019-05-19 ENCOUNTER — Other Ambulatory Visit: Payer: Medicare Other

## 2019-05-19 LAB — URINE CULTURE

## 2019-05-19 LAB — NOVEL CORONAVIRUS, NAA (HOSP ORDER, SEND-OUT TO REF LAB; TAT 18-24 HRS): SARS-CoV-2, NAA: NOT DETECTED

## 2019-05-27 DIAGNOSIS — F322 Major depressive disorder, single episode, severe without psychotic features: Secondary | ICD-10-CM | POA: Insufficient documentation

## 2019-05-28 DIAGNOSIS — F450 Somatization disorder: Secondary | ICD-10-CM | POA: Insufficient documentation

## 2019-05-28 DIAGNOSIS — F329 Major depressive disorder, single episode, unspecified: Secondary | ICD-10-CM | POA: Insufficient documentation

## 2019-06-01 DIAGNOSIS — F418 Other specified anxiety disorders: Secondary | ICD-10-CM | POA: Insufficient documentation

## 2019-06-23 MED ORDER — MAGNESIUM OXIDE 400 MG PO TABS
400.00 | ORAL_TABLET | ORAL | Status: DC
Start: 2019-06-23 — End: 2019-06-23

## 2019-06-23 MED ORDER — LIDOCAINE HCL 1 % IJ SOLN
0.50 | INTRAMUSCULAR | Status: DC
Start: ? — End: 2019-06-23

## 2019-06-23 MED ORDER — POTASSIUM CHLORIDE CRYS ER 20 MEQ PO TBCR
40.00 | EXTENDED_RELEASE_TABLET | ORAL | Status: DC
Start: 2019-06-23 — End: 2019-06-23

## 2019-06-23 MED ORDER — GENERIC EXTERNAL MEDICATION
1.00 | Status: DC
Start: 2019-06-23 — End: 2019-06-23

## 2019-06-23 MED ORDER — MIRTAZAPINE 15 MG PO TABS
7.50 | ORAL_TABLET | ORAL | Status: DC
Start: 2019-06-23 — End: 2019-06-23

## 2019-06-23 MED ORDER — SENNOSIDES-DOCUSATE SODIUM 8.6-50 MG PO TABS
2.00 | ORAL_TABLET | ORAL | Status: DC
Start: 2019-06-23 — End: 2019-06-23

## 2019-06-23 MED ORDER — BRIMONIDINE TARTRATE 0.15 % OP SOLN
1.00 | OPHTHALMIC | Status: DC
Start: 2019-06-23 — End: 2019-06-23

## 2019-06-23 MED ORDER — HYDROMORPHONE HCL 1 MG/ML IJ SOLN
0.25 | INTRAMUSCULAR | Status: DC
Start: ? — End: 2019-06-23

## 2019-06-23 MED ORDER — HYDRALAZINE HCL 50 MG PO TABS
100.00 | ORAL_TABLET | ORAL | Status: DC
Start: 2019-06-23 — End: 2019-06-23

## 2019-06-23 MED ORDER — ACETAMINOPHEN 325 MG PO TABS
650.00 | ORAL_TABLET | ORAL | Status: DC
Start: ? — End: 2019-06-23

## 2019-06-23 MED ORDER — DULOXETINE HCL 30 MG PO CPEP
30.00 | ORAL_CAPSULE | ORAL | Status: DC
Start: 2019-06-24 — End: 2019-06-23

## 2019-06-23 MED ORDER — AMLODIPINE BESYLATE 5 MG PO TABS
5.00 | ORAL_TABLET | ORAL | Status: DC
Start: 2019-06-24 — End: 2019-06-23

## 2019-06-23 MED ORDER — ONDANSETRON 4 MG PO TBDP
4.00 | ORAL_TABLET | ORAL | Status: DC
Start: ? — End: 2019-06-23

## 2019-06-23 MED ORDER — ENOXAPARIN SODIUM 40 MG/0.4ML ~~LOC~~ SOLN
40.00 | SUBCUTANEOUS | Status: DC
Start: 2019-06-23 — End: 2019-06-23

## 2019-06-23 MED ORDER — LATANOPROST 0.005 % OP SOLN
1.00 | OPHTHALMIC | Status: DC
Start: 2019-06-23 — End: 2019-06-23

## 2019-06-23 MED ORDER — BISACODYL 10 MG RE SUPP
10.00 | RECTAL | Status: DC
Start: 2019-06-23 — End: 2019-06-23

## 2019-06-23 MED ORDER — FLUCONAZOLE IN DEXTROSE 200 MG/100ML IV SOLN
200.00 | INTRAVENOUS | Status: DC
Start: 2019-06-24 — End: 2019-06-23

## 2019-06-23 MED ORDER — PANTOPRAZOLE SODIUM 40 MG PO TBEC
40.00 | DELAYED_RELEASE_TABLET | ORAL | Status: DC
Start: 2019-06-24 — End: 2019-06-23

## 2019-06-23 MED ORDER — POLYETHYLENE GLYCOL 3350 17 GM/SCOOP PO POWD
17.00 | ORAL | Status: DC
Start: 2019-06-23 — End: 2019-06-23

## 2019-06-23 MED ORDER — ATORVASTATIN CALCIUM 10 MG PO TABS
10.00 | ORAL_TABLET | ORAL | Status: DC
Start: 2019-06-24 — End: 2019-06-23

## 2019-06-23 MED ORDER — CARVEDILOL 12.5 MG PO TABS
12.50 | ORAL_TABLET | ORAL | Status: DC
Start: 2019-06-23 — End: 2019-06-23

## 2019-06-23 MED ORDER — BENAZEPRIL HCL 20 MG PO TABS
40.00 | ORAL_TABLET | ORAL | Status: DC
Start: 2019-06-24 — End: 2019-06-23

## 2019-06-23 MED ORDER — OXYCODONE HCL 5 MG PO TABS
5.00 | ORAL_TABLET | ORAL | Status: DC
Start: ? — End: 2019-06-23

## 2019-06-23 MED ORDER — LABETALOL HCL 5 MG/ML IV SOLN
10.00 | INTRAVENOUS | Status: DC
Start: ? — End: 2019-06-23

## 2019-06-23 MED ORDER — DORZOLAMIDE HCL-TIMOLOL MAL 22.3-6.8 MG/ML OP SOLN
1.00 | OPHTHALMIC | Status: DC
Start: 2019-06-23 — End: 2019-06-23

## 2019-06-27 DIAGNOSIS — K219 Gastro-esophageal reflux disease without esophagitis: Secondary | ICD-10-CM | POA: Diagnosis not present

## 2019-06-27 DIAGNOSIS — R339 Retention of urine, unspecified: Secondary | ICD-10-CM

## 2019-06-27 DIAGNOSIS — R296 Repeated falls: Secondary | ICD-10-CM | POA: Diagnosis not present

## 2019-06-27 DIAGNOSIS — M6281 Muscle weakness (generalized): Secondary | ICD-10-CM | POA: Diagnosis not present

## 2019-06-27 DIAGNOSIS — A4151 Sepsis due to Escherichia coli [E. coli]: Secondary | ICD-10-CM | POA: Diagnosis not present

## 2019-06-27 DIAGNOSIS — I10 Essential (primary) hypertension: Secondary | ICD-10-CM

## 2019-06-27 DIAGNOSIS — I5030 Unspecified diastolic (congestive) heart failure: Secondary | ICD-10-CM

## 2019-06-29 DIAGNOSIS — M6281 Muscle weakness (generalized): Secondary | ICD-10-CM | POA: Diagnosis not present

## 2019-06-29 DIAGNOSIS — R630 Anorexia: Secondary | ICD-10-CM | POA: Diagnosis not present

## 2019-06-30 DIAGNOSIS — I10 Essential (primary) hypertension: Secondary | ICD-10-CM

## 2019-06-30 DIAGNOSIS — E1159 Type 2 diabetes mellitus with other circulatory complications: Secondary | ICD-10-CM | POA: Diagnosis not present

## 2019-06-30 DIAGNOSIS — F05 Delirium due to known physiological condition: Secondary | ICD-10-CM | POA: Diagnosis not present

## 2019-06-30 DIAGNOSIS — E441 Mild protein-calorie malnutrition: Secondary | ICD-10-CM

## 2019-06-30 DIAGNOSIS — R531 Weakness: Secondary | ICD-10-CM | POA: Diagnosis not present

## 2019-06-30 DIAGNOSIS — Z853 Personal history of malignant neoplasm of breast: Secondary | ICD-10-CM | POA: Diagnosis not present

## 2019-06-30 DIAGNOSIS — I5032 Chronic diastolic (congestive) heart failure: Secondary | ICD-10-CM

## 2019-07-01 ENCOUNTER — Telehealth: Payer: Self-pay

## 2019-07-01 NOTE — Telephone Encounter (Signed)
Per [place] message from [facility]: message from Jackie Allison but she is a est pt of yours, " I have just taken over care of this patient at [facility] where she was sent for rehab after hospital stay at [facility]. She has recently treated breast cancer that seems to be quiet. she has new pancytopenia--which they really didn't do anything about (and which has now worsened with WBC 1K, HGB 7.4 and platelets 74K). they diagnosed her with fibromyalgia which doesn' Doesn't make sense in an 79 year old without prior symptoms. her sed rate was in the 70's and I am trying empiric Rx with prednisone in case she has PMR and that explains everything (but I am not optimistic about that). I asked the nurse at [facility] to call your office to set her up ASAP. I hope that you or one of your partners can see her by early next week. Thanks, Jackie Allison"  In January patient requsted referral for second opinion to be sent to [facility] and canceled scheduled appts on 1/21. I contacted [facility] and left message with nurse [place] to have Jackie. Silvio Allison contact Jackie. Tasia Catchings to discuss patient.

## 2019-07-04 DIAGNOSIS — M353 Polymyalgia rheumatica: Secondary | ICD-10-CM | POA: Diagnosis not present

## 2019-07-04 DIAGNOSIS — D61818 Other pancytopenia: Secondary | ICD-10-CM | POA: Diagnosis not present

## 2019-07-26 DIAGNOSIS — R2242 Localized swelling, mass and lump, left lower limb: Secondary | ICD-10-CM | POA: Diagnosis not present

## 2019-08-08 DIAGNOSIS — R630 Anorexia: Secondary | ICD-10-CM | POA: Insufficient documentation

## 2019-09-30 ENCOUNTER — Other Ambulatory Visit: Payer: Self-pay

## 2019-09-30 ENCOUNTER — Encounter: Payer: Self-pay | Admitting: Radiation Oncology

## 2019-10-03 ENCOUNTER — Ambulatory Visit
Admission: RE | Admit: 2019-10-03 | Discharge: 2019-10-03 | Disposition: A | Discharge status: Home / Self Care | Payer: PRIVATE HEALTH INSURANCE | Source: Ambulatory Visit | Attending: Radiation Oncology | Admitting: Radiation Oncology

## 2019-10-03 DIAGNOSIS — C50912 Malignant neoplasm of unspecified site of left female breast: Secondary | ICD-10-CM

## 2019-10-24 DIAGNOSIS — M48061 Spinal stenosis, lumbar region without neurogenic claudication: Secondary | ICD-10-CM | POA: Insufficient documentation

## 2019-10-24 DIAGNOSIS — G5760 Lesion of plantar nerve, unspecified lower limb: Secondary | ICD-10-CM | POA: Insufficient documentation

## 2019-10-24 DIAGNOSIS — M25569 Pain in unspecified knee: Secondary | ICD-10-CM | POA: Insufficient documentation

## 2019-10-24 DIAGNOSIS — M6740 Ganglion, unspecified site: Secondary | ICD-10-CM | POA: Insufficient documentation

## 2019-10-24 DIAGNOSIS — H18829 Corneal disorder due to contact lens, unspecified eye: Secondary | ICD-10-CM | POA: Insufficient documentation

## 2019-10-24 DIAGNOSIS — M961 Postlaminectomy syndrome, not elsewhere classified: Secondary | ICD-10-CM | POA: Insufficient documentation

## 2019-10-24 DIAGNOSIS — E559 Vitamin D deficiency, unspecified: Secondary | ICD-10-CM | POA: Insufficient documentation

## 2019-10-24 DIAGNOSIS — M1990 Unspecified osteoarthritis, unspecified site: Secondary | ICD-10-CM | POA: Insufficient documentation

## 2019-10-24 DIAGNOSIS — B351 Tinea unguium: Secondary | ICD-10-CM | POA: Insufficient documentation

## 2019-10-24 DIAGNOSIS — H33009 Unspecified retinal detachment with retinal break, unspecified eye: Secondary | ICD-10-CM | POA: Insufficient documentation

## 2019-10-24 DIAGNOSIS — M76899 Other specified enthesopathies of unspecified lower limb, excluding foot: Secondary | ICD-10-CM | POA: Insufficient documentation

## 2019-10-24 DIAGNOSIS — K6389 Other specified diseases of intestine: Secondary | ICD-10-CM | POA: Insufficient documentation

## 2019-10-24 DIAGNOSIS — Z9889 Other specified postprocedural states: Secondary | ICD-10-CM | POA: Insufficient documentation

## 2019-10-24 DIAGNOSIS — K2270 Barrett's esophagus without dysplasia: Secondary | ICD-10-CM | POA: Insufficient documentation

## 2019-11-10 ENCOUNTER — Ambulatory Visit (INDEPENDENT_AMBULATORY_CARE_PROVIDER_SITE_OTHER): Payer: Medicare Other | Admitting: Vascular Surgery

## 2019-11-14 ENCOUNTER — Telehealth: Payer: Self-pay | Admitting: Adult Health Nurse Practitioner

## 2019-11-14 NOTE — Telephone Encounter (Signed)
Returned call to patient's husband, Norm, and we have scheduled the In-home Palliative Consult for 11/21/19 @ 3:30 PM.

## 2019-11-14 NOTE — Telephone Encounter (Signed)
Spoke with patient's husband, [name], regarding Palliative services and he was in agreement with this.  Husband wanted to call me back after the physical therapist left to schedule the appointment.

## 2019-11-21 ENCOUNTER — Other Ambulatory Visit: Payer: Self-pay

## 2019-11-21 ENCOUNTER — Other Ambulatory Visit: Payer: Medicare Other | Admitting: Adult Health Nurse Practitioner

## 2019-11-21 DIAGNOSIS — Z862 Personal history of diseases of the blood and blood-forming organs and certain disorders involving the immune mechanism: Secondary | ICD-10-CM

## 2019-11-21 DIAGNOSIS — Z515 Encounter for palliative care: Secondary | ICD-10-CM

## 2019-11-21 NOTE — Progress Notes (Signed)
[facility] Consult Note Telephone: [phone]  Fax: [phone]  PATIENT NAME: Jackie Allison DOB: 26-Nov-1940 MRN: [id number]  PRIMARY CARE PROVIDER:   Dion Body, MD  REFERRING PROVIDER:  Dion Body, MD [facility] [place],   [id number]  RESPONSIBLE PARTY:   Thomasenia Dowse, husband  H: [phone]  C: [phone]    RECOMMENDATIONS and PLAN:  1.  Advanced care planning.  Patient is full code.  Husband states that she had appointment with PCP today and stated that she wanted to have CPR tried and would like life prolonging measures.  Stated that they are going to a lawyer to have advanced directives done.  Have encouraged that once they do this to make sure that providers have copies.  2.  Functional status.  Patient uses a walker to ambulate but still can only go short distances due to weakness related to anemia.  She does require some assistance with ADLs.  She can feed herself.  Has functional urinary incontinence due to weakness.  Does have occasional incontinence at night. Denies urinary frequency or urgency, dysuria, hematuria. She in continent of bowel.  PCP is arranging for patient to have blood transfusion for her anemia with Hgb of 7.3.  Patient is working with PT through [facility] and husband states that she is getting a little bit stronger.  Patient should feel stronger after her transfusion and as she continues with PT.  3.  Nutritional status.  Patient has had a loss of appetite and in January 2021 weighed 147 pounds with BMI of 24.5 and on 11/10/19 weighed 107 pounds with BMI of 17.91.  Patient was started on Megace and husband states that her appetite has greatly improved and is eating most if not all of her meals and supplements with Ensure.  Continue megace and monitor for weight gain  4.  Pain.  Husband does state that she does not complain of pain as much and when she does  have pain she gets good relief with Tylenol and aspercreme.  Continue current pain regimen as she gets good relief with this   5.  Constipation.  Patient gets occasional constipation and gets good relief with PRN dulcolax.  Denies N/V/D or abdominal pain.  Continue dulcolax PRN  6.  Support.  Husband gets help from his daughter with his wife and does not feel like he needs any additional help at this time.  Did give him a list of resources in case needed.  Also discussed enlisting help of SW at [facility] if needed.  Patient appears to be improving slowly and hopes to improve more with the blood transfusion, improved appetite, and PT.  Palliative will continue to monitor for symptom management/decline and make recommendations as needed.  Husband prefers monthly check in and will schedule follow up if needed.  I spent 90 minutes providing this consultation,  from 3:30 to 5:00 including time spent with patient/family, chart review, provider coordination, documentation. More than 50% of the time in this consultation was spent coordinating communication.   HISTORY OF PRESENT ILLNESS:  Jackie Allison is a 79 y.o. year old female with multiple medical problems including left breast cancer DMT2, HTN, anemia, HLD, OA, fibromyalgia, depression, h/o Barrett's esophagus. Palliative Care was asked to help address goals of care. Patient deferred exam today and most of history obtained by husband and chart review. Patient diagnosed with left breast cancer in 2019.  She underwent left lumpectomy in  December 2019.  She had infection in left breast about a month after lumpectomy and then again about 5 weeks after that.  Husband states that it took til the summer of 2020 for it to heal enough for her to undergo radiation and then due to [place] did not have chemo but was given pills to take at home.  Patient started having pain again in shoulders abound collar bones.  Reevaluated for possible return to cancer and patient found  not to have recurrence of cancer but was told she had fibromyalgia.  Patient was hospitalized 2/19-2/26/2021 for urosepsis.  CODE STATUS: full code  PPS: 40% HOSPICE ELIGIBILITY/DIAGNOSIS: TBD  PHYSICAL EXAM:   Deferred  PAST MEDICAL HISTORY:  Past Medical History:  Diagnosis Date  . Anemia    chronic microcytic anemia  . Breast abscess 06/20/2018  . Breast cancer ([place]) 03/2018   Invasive Mammary  . Cancer ([place]) 2019   left breast  . Diabetes mellitus without complication ([place])   . GERD (gastroesophageal reflux disease)   . Glaucoma   . Hypercholesterolemia   . Hypertension     SOCIAL HX:  Social History   Tobacco Use  . Smoking status: Never Smoker  . Smokeless tobacco: Never Used  Substance Use Topics  . Alcohol use: Never    ALLERGIES:  Allergies  Allergen Reactions  . Shellfish Allergy Anaphylaxis  . Levofloxacin Other (See Comments)    Confusion and nervouseness  . Iodine     Patient unsure if allergic to topical betadine Shellfish allergy with ingestion  . Sulfa Antibiotics Itching     PERTINENT MEDICATIONS:  Outpatient Encounter Medications as of 11/21/2019  Medication Sig  . acetaminophen (TYLENOL) 500 MG tablet Take 500 mg by mouth every 6 (six) hours as needed for moderate pain.   Marland Kitchen anastrozole (ARIMIDEX) 1 MG tablet Take 1 tablet (1 mg total) by mouth daily.  . brimonidine (ALPHAGAN) 0.2 % ophthalmic solution   . bumetanide (BUMEX) 1 MG tablet Take 1 mg by mouth daily.  . dorzolamide-timolol (COSOPT) 22.3-6.8 MG/ML ophthalmic solution   . DULoxetine (CYMBALTA) 30 MG capsule Take 30 mg by mouth daily.  . eszopiclone (LUNESTA) 1 MG TABS tablet Take 1 tablet (1 mg total) by mouth at bedtime as needed for sleep. Take immediately before bedtime  . ferrous sulfate 325 (65 FE) MG EC tablet TAKE 1 TABLET (325 MG TOTAL) BY MOUTH 2 (TWO) TIMES DAILY WITH A MEAL.  . hydrALAZINE (APRESOLINE) 100 MG tablet Take 100 mg by mouth 3 (three) times daily.   Marland Kitchen  latanoprost (XALATAN) 0.005 % ophthalmic solution   . loperamide (IMODIUM A-D) 2 MG tablet Take 1 tablet (2 mg total) by mouth 4 (four) times daily as needed for diarrhea or loose stools.  . Multiple Vitamin (MULTI-VITAMINS) TABS Take 1 tablet by mouth daily.   Marland Kitchen omeprazole (PRILOSEC) 40 MG capsule Take 40 mg by mouth daily.  . potassium chloride SA (KLOR-CON) 20 MEQ tablet Take 20 mEq by mouth daily.   No facility-administered encounter medications on file as of 11/21/2019.      Jackie Allison Jenetta Downer, NP

## 2019-11-22 ENCOUNTER — Telehealth: Payer: Self-pay

## 2019-11-22 DIAGNOSIS — D509 Iron deficiency anemia, unspecified: Secondary | ICD-10-CM

## 2019-11-22 NOTE — Telephone Encounter (Signed)
Spoke with the patient and she would like to come back to [facility] to see Dr. Tasia Catchings.  Mr. Riggins voiced that Dr. Netty Starring wanted her to get a transfusion.  Advised him and patient that she would need to be evaluated by MD here before the decision for transfusion is made.

## 2019-11-22 NOTE — Telephone Encounter (Signed)
Dr. Tasia Catchings would like for patient to be scheduled next week for lab (prior)/MD/poss blood transfusion (cbc cmp hold tube).    Please schedule and inform patient/husband of appt details.

## 2019-11-22 NOTE — Telephone Encounter (Signed)
Per secure chat from [place] : Called pt husband [name] and made him aware her sched 11/30/19 appts. Per his request to have her labs drawn on 11/29/19.

## 2019-11-22 NOTE — Telephone Encounter (Signed)
Request received from Dr. Raylene Miyamoto office for patient to be seen by Dr. Tasia Catchings.  Patient has transferred her care to [facility].  Called to discuss with patient if she would like to come back to see Dr. Tasia Catchings or continue her care with [facility].    Speaking with Mr. Demauro on the phone with Mrs. Eiben speaking/listening in the back.  Patient states that she would prefer to continue her care at [facility].  Mr. Justiniano states he will call [facility] for an appointment.  Will fax note over to Dr. Velvet Bathe office with patient's preference to f/u with [facility].

## 2019-11-29 ENCOUNTER — Other Ambulatory Visit: Payer: Self-pay

## 2019-11-29 ENCOUNTER — Inpatient Hospital Stay: Payer: Medicare Other | Attending: Oncology

## 2019-11-29 DIAGNOSIS — C50919 Malignant neoplasm of unspecified site of unspecified female breast: Secondary | ICD-10-CM | POA: Insufficient documentation

## 2019-11-29 DIAGNOSIS — Z79811 Long term (current) use of aromatase inhibitors: Secondary | ICD-10-CM | POA: Insufficient documentation

## 2019-11-29 DIAGNOSIS — M8580 Other specified disorders of bone density and structure, unspecified site: Secondary | ICD-10-CM | POA: Diagnosis not present

## 2019-11-29 DIAGNOSIS — Z79899 Other long term (current) drug therapy: Secondary | ICD-10-CM | POA: Diagnosis not present

## 2019-11-29 DIAGNOSIS — Z833 Family history of diabetes mellitus: Secondary | ICD-10-CM | POA: Insufficient documentation

## 2019-11-29 DIAGNOSIS — D509 Iron deficiency anemia, unspecified: Secondary | ICD-10-CM | POA: Diagnosis not present

## 2019-11-29 DIAGNOSIS — R627 Adult failure to thrive: Secondary | ICD-10-CM | POA: Insufficient documentation

## 2019-11-29 DIAGNOSIS — Z8249 Family history of ischemic heart disease and other diseases of the circulatory system: Secondary | ICD-10-CM | POA: Diagnosis not present

## 2019-11-29 DIAGNOSIS — I10 Essential (primary) hypertension: Secondary | ICD-10-CM | POA: Insufficient documentation

## 2019-11-29 DIAGNOSIS — Z170 Estrogen receptor positive status [ER+]: Secondary | ICD-10-CM | POA: Insufficient documentation

## 2019-11-29 DIAGNOSIS — Z803 Family history of malignant neoplasm of breast: Secondary | ICD-10-CM | POA: Diagnosis not present

## 2019-11-29 DIAGNOSIS — E119 Type 2 diabetes mellitus without complications: Secondary | ICD-10-CM | POA: Insufficient documentation

## 2019-11-29 LAB — COMPREHENSIVE METABOLIC PANEL
ALT: 14 U/L (ref 0–44)
AST: 15 U/L (ref 15–41)
Albumin: 3.2 g/dL — ABNORMAL LOW (ref 3.5–5.0)
Alkaline Phosphatase: 54 U/L (ref 38–126)
Anion gap: 9 (ref 5–15)
BUN: 20 mg/dL (ref 8–23)
CO2: 22 mmol/L (ref 22–32)
Calcium: 9.3 mg/dL (ref 8.9–10.3)
Chloride: 103 mmol/L (ref 98–111)
Creatinine, Ser: 0.59 mg/dL (ref 0.44–1.00)
GFR calc Af Amer: >60 mL/min (ref >60)
GFR calc non Af Amer: >60 mL/min (ref >60)
Glucose, Bld: 90 mg/dL (ref 70–99)
Potassium: 3.8 mmol/L (ref 3.5–5.1)
Sodium: 134 mmol/L — ABNORMAL LOW (ref 135–145)
Total Bilirubin: 0.5 mg/dL (ref 0.3–1.2)
Total Protein: 8 g/dL (ref 6.5–8.1)

## 2019-11-29 LAB — CBC WITH DIFFERENTIAL/PLATELET
Abs Immature Granulocytes: 0.03 10*3/uL (ref 0.00–0.07)
Basophils Absolute: 0 10*3/uL (ref 0.0–0.1)
Basophils Relative: 0 %
Eosinophils Absolute: 0 10*3/uL (ref 0.0–0.5)
Eosinophils Relative: 1 %
HCT: 27.4 % — ABNORMAL LOW (ref 36.0–46.0)
Hemoglobin: 8.9 g/dL — ABNORMAL LOW (ref 12.0–15.0)
Immature Granulocytes: 1 %
Lymphocytes Relative: 21 %
Lymphs Abs: 0.9 10*3/uL (ref 0.7–4.0)
MCH: 25.2 pg — ABNORMAL LOW (ref 26.0–34.0)
MCHC: 32.5 g/dL (ref 30.0–36.0)
MCV: 77.6 fL — ABNORMAL LOW (ref 80.0–100.0)
Monocytes Absolute: 0.3 10*3/uL (ref 0.1–1.0)
Monocytes Relative: 8 %
Neutro Abs: 2.9 10*3/uL (ref 1.7–7.7)
Neutrophils Relative %: 69 %
Platelets: 401 10*3/uL — ABNORMAL HIGH (ref 150–400)
RBC: 3.53 MIL/uL — ABNORMAL LOW (ref 3.87–5.11)
RDW: 18.6 % — ABNORMAL HIGH (ref 11.5–15.5)
WBC: 4.2 10*3/uL (ref 4.0–10.5)
nRBC: 0 % (ref 0.0–0.2)

## 2019-11-29 LAB — SAMPLE TO BLOOD BANK

## 2019-11-29 LAB — ABO/RH: ABO/RH(D): B POS

## 2019-11-30 ENCOUNTER — Encounter: Payer: Self-pay | Admitting: Oncology

## 2019-11-30 ENCOUNTER — Inpatient Hospital Stay (HOSPITAL_BASED_OUTPATIENT_CLINIC_OR_DEPARTMENT_OTHER): Payer: Medicare Other | Admitting: Oncology

## 2019-11-30 ENCOUNTER — Other Ambulatory Visit: Payer: Self-pay

## 2019-11-30 ENCOUNTER — Other Ambulatory Visit: Payer: PRIVATE HEALTH INSURANCE

## 2019-11-30 ENCOUNTER — Inpatient Hospital Stay: Payer: Medicare Other

## 2019-11-30 VITALS — BP 153/78 | HR 82 | Temp 97.3°F | Wt 109.5 lb

## 2019-11-30 DIAGNOSIS — Z853 Personal history of malignant neoplasm of breast: Secondary | ICD-10-CM

## 2019-11-30 DIAGNOSIS — D509 Iron deficiency anemia, unspecified: Secondary | ICD-10-CM

## 2019-11-30 DIAGNOSIS — R6251 Failure to thrive (child): Secondary | ICD-10-CM

## 2019-11-30 DIAGNOSIS — C50919 Malignant neoplasm of unspecified site of unspecified female breast: Secondary | ICD-10-CM | POA: Diagnosis not present

## 2019-11-30 DIAGNOSIS — D563 Thalassemia minor: Secondary | ICD-10-CM | POA: Insufficient documentation

## 2019-11-30 LAB — FERRITIN: Ferritin: 328 ng/mL — ABNORMAL HIGH (ref 11–307)

## 2019-11-30 LAB — RETIC PANEL
Immature Retic Fract: 12.8 % (ref 2.3–15.9)
RBC.: 3.62 MIL/uL — ABNORMAL LOW (ref 3.87–5.11)
Retic Count, Absolute: 45.3 10*3/uL (ref 19.0–186.0)
Retic Ct Pct: 1.3 % (ref 0.4–3.1)
Reticulocyte Hemoglobin: 26.9 pg — ABNORMAL LOW (ref >27.9)

## 2019-11-30 LAB — IRON AND TIBC
Iron: 29 ug/dL (ref 28–170)
Saturation Ratios: 16 % (ref 10.4–31.8)
TIBC: 188 ug/dL — ABNORMAL LOW (ref 250–450)
UIBC: 159 ug/dL

## 2019-11-30 MED ORDER — VITAMIN B-12 1000 MCG PO TABS
1000.0000 ug | ORAL_TABLET | Freq: Every day | ORAL | 0 refills | Status: DC
Start: 2019-11-30 — End: 2020-12-02

## 2019-11-30 NOTE — Progress Notes (Signed)
Hematology/Oncology follow up note [facility] [phone] [phone]   Patient Care Team: Dion Body, MD as PCP - General (Family Medicine) Earlie Server, MD as Consulting Physician (Hematology and Oncology)  REASON FOR VISIT:  Follow up for management  of Stage IIA breast cancer  HISTORY OF PRESENTING ILLNESS:  Jackie Allison is a  79 y.o.  female with PMH listed below who was referred to me to reestablish care for breast cancer and anemia evaluation.  #  04/13/2018 stage IIa breast cancer pT2 pN0 Grade 2, ER positive, PR negative HER-2 negative.No Lymphovascular invasion # 06/19/2018 wound infection. Surgical wound culture positive for MRSA, as well as enterococcus faecalis. Patient was discharged on 06/22/2018. Oncotype DX recurrence score came back at 27, predicting distant recurrence risk at 9 years with tamoxifen alone around 16%.  Group average absolute chemotherapy benefit >15%.  Patient declined chemotherapy.  # finished adjuvant radiation-last RT 09/27/2018 #June 2020 start adjuvant antiestrogen treatment with Arimidex 1 mg daily  #Osteopenic, received a dental clearance for Zometa.  Started on Zometa 4 mg every 6 months.  Received last dose on 11/16/19.  Patient was offered Zometa on 05/04/2019 and she declined. # Bone pain, 03/04/2019 bone scan whole body findings fever degenerated changes or related to previous surgery.  No worrisome findings for osseous metastatic disease  INTERVAL HISTORY Jackie Allison is a 79 y.o. female who has above history reviewed by me today presents to reestablish care for anemia, follow-up for breast cancer.  #Patient was last seen by me in January 2021.  At that time patient has anemia and has had extensive blood work-up which did not reveal etiology of the anemia.  I had recommended bone marrow biopsy  at that time and patient declined.  She also had diffuse bone pain, bone scan was negative and I recommend  patient to temporarily hold off Arimidex.  After that visit she transferred her oncology care to [facility] and was seen by Dr. Rolley Sims.  Patient was recommended to continue Arimidex which she has been doing since then.  Patient had admissions due to change of mental status, UTI, failure to thrive weight loss, decreased appetite. Patient was found to have progressively worsening of anemia, fatigue and was referred back to establish care with me for further evaluation.  Today patient was accompanied by her husband.  She states that she wants to transfer her care back to me.  Currently lives at home with husband.  Appetite has slightly improved after being started on Megace. She has generalized bone pain and a fibromyalgia, follows up with primary care provider.  Currently patient is on Arimidex 1 mg daily.  She is a poor historian.  Husband provides most of the history.  Review of Systems  Constitutional: Positive for appetite change, fatigue and unexpected weight change. Negative for chills and fever.  HENT:   Negative for hearing loss and voice change.   Eyes: Negative for eye problems.  Respiratory: Negative for chest tightness and cough.   Cardiovascular: Positive for leg swelling. Negative for chest pain.  Gastrointestinal: Negative for abdominal distention, abdominal pain and blood in stool.  Endocrine: Negative for hot flashes.  Genitourinary: Negative for difficulty urinating and frequency.   Musculoskeletal: Positive for arthralgias.       Chronic back pain Generalized bone pain  Skin: Negative for itching and rash.  Neurological: Negative for extremity weakness.  Hematological: Negative for adenopathy.  Psychiatric/Behavioral: Negative for confusion.    MEDICAL HISTORY:  Past Medical History:  Diagnosis Date  . Anemia    chronic microcytic anemia  . Breast abscess 06/20/2018  . Breast cancer ([place]) 03/2018   Invasive Mammary  . Cancer ([place]) 2019   left breast  . Diabetes  mellitus without complication ([place])   . GERD (gastroesophageal reflux disease)   . Glaucoma   . Hypercholesterolemia   . Hypertension     SURGICAL HISTORY: Past Surgical History:  Procedure Laterality Date  . BACK SURGERY    . BREAST BIOPSY Left 02/2018  . BREAST EXCISIONAL BIOPSY Right ??  . BREAST LUMPECTOMY Left 04/03/2018   Invasive Mammary CA Neg margins  . CATARACT EXTRACTION W/ INTRAOCULAR LENS  IMPLANT, BILATERAL    . EYE SURGERY Bilateral    cataract extraction  . INCISION AND DRAINAGE ABSCESS Left 04/28/2018   Procedure: INCISION AND DRAINAGE ABSCESS;  Surgeon: Benjamine Sprague, DO;  Location: [facility] ORS;  Service: General;  Laterality: Left;  . INCISION AND DRAINAGE ABSCESS Left 06/20/2018   Procedure: INCISION AND DRAINAGE ABSCESS;  Surgeon: Herbert Pun, MD;  Location: [facility] ORS;  Service: General;  Laterality: Left;  . LUMBAR FUSION  2002?  . LUMBAR LAMINECTOMY  1997   titanium in back  . PARTIAL MASTECTOMY WITH AXILLARY SENTINEL LYMPH NODE BIOPSY Left 04/13/2018   Procedure: PARTIAL MASTECTOMY WITH AXILLARY SENTINEL LYMPH NODE BIOPSY  (No Needle Loc);  Surgeon: Benjamine Sprague, DO;  Location: [facility] ORS;  Service: General;  Laterality: Left;  . RETINAL DETACHMENT SURGERY Bilateral 2009  . TUBAL LIGATION      SOCIAL HISTORY: Social History   Socioeconomic History  . Marital status: Married    Spouse name: [name]  . Number of children: Not on file  . Years of education: Not on file  . Highest education level: Not on file  Occupational History  . Occupation: teacher/real estate  Tobacco Use  . Smoking status: Never Smoker  . Smokeless tobacco: Never Used  Vaping Use  . Vaping Use: Never used  Substance and Sexual Activity  . Alcohol use: Never  . Drug use: Never  . Sexual activity: Not on file  Other Topics Concern  . Not on file  Social History Narrative  . Not on file   Social Determinants of Health   Financial Resource Strain:   . Difficulty of  Paying Living Expenses:   Food Insecurity:   . Worried About Charity fundraiser in the Last Year:   . Arboriculturist in the Last Year:   Transportation Needs:   . Film/video editor (Medical):   Marland Kitchen Lack of Transportation (Non-Medical):   Physical Activity:   . Days of Exercise per Week:   . Minutes of Exercise per Session:   Stress:   . Feeling of Stress :   Social Connections:   . Frequency of Communication with Friends and Family:   . Frequency of Social Gatherings with Friends and Family:   . Attends Religious Services:   . Active Member of Clubs or Organizations:   . Attends Archivist Meetings:   Marland Kitchen Marital Status:   Intimate Partner Violence:   . Fear of Current or Ex-Partner:   . Emotionally Abused:   Marland Kitchen Physically Abused:   . Sexually Abused:     FAMILY HISTORY: Family History  Problem Relation Age of Onset  . Diabetes Sister   . Diabetes Brother   . Diabetes Sister   . Breast cancer Other   . Hypertension Mother   .  Glaucoma Mother   . Heart attack Father     ALLERGIES:  is allergic to shellfish allergy, levofloxacin, iodine, and sulfa antibiotics.  MEDICATIONS:  Current Outpatient Medications  Medication Sig Dispense Refill  . acetaminophen (TYLENOL) 500 MG tablet Take 500 mg by mouth every 6 (six) hours as needed for moderate pain.     Marland Kitchen amLODipine (NORVASC) 2.5 MG tablet Take by mouth.    Marland Kitchen anastrozole (ARIMIDEX) 1 MG tablet Take 1 tablet (1 mg total) by mouth daily. 90 tablet 1  . atorvastatin (LIPITOR) 10 MG tablet Take 10 mg by mouth daily.    . benazepril (LOTENSIN) 40 MG tablet Take 40 mg by mouth daily.    . bisacodyl (DULCOLAX) 5 MG EC tablet Take 5 mg by mouth daily as needed for moderate constipation.    . brimonidine (ALPHAGAN) 0.2 % ophthalmic solution     . bumetanide (BUMEX) 1 MG tablet Take 1 mg by mouth daily.    . dorzolamide-timolol (COSOPT) 22.3-6.8 MG/ML ophthalmic solution     . DULoxetine (CYMBALTA) 30 MG capsule Take  30 mg by mouth daily.    . eszopiclone (LUNESTA) 1 MG TABS tablet Take 1 tablet (1 mg total) by mouth at bedtime as needed for sleep. Take immediately before bedtime 30 tablet 0  . ferrous sulfate 325 (65 FE) MG EC tablet TAKE 1 TABLET (325 MG TOTAL) BY MOUTH 2 (TWO) TIMES DAILY WITH A MEAL. 60 tablet 1  . hydrALAZINE (APRESOLINE) 100 MG tablet Take 100 mg by mouth 3 (three) times daily.   3  . latanoprost (XALATAN) 0.005 % ophthalmic solution     . megestrol (MEGACE) 40 MG/ML suspension Take 20 mLs by mouth daily.    . mirtazapine (REMERON) 7.5 MG tablet Take 7.5 mg by mouth daily.    . Multiple Vitamin (MULTI-VITAMINS) TABS Take 1 tablet by mouth daily.     Marland Kitchen omeprazole (PRILOSEC) 40 MG capsule Take 40 mg by mouth daily.    . potassium chloride SA (KLOR-CON) 20 MEQ tablet Take 20 mEq by mouth daily.    . predniSONE (DELTASONE) 10 MG tablet Take by mouth as directed.    . loperamide (IMODIUM A-D) 2 MG tablet Take 1 tablet (2 mg total) by mouth 4 (four) times daily as needed for diarrhea or loose stools. (Patient not taking: Reported on 11/30/2019) 30 tablet 0  . vitamin B-12 (CYANOCOBALAMIN) 1000 MCG tablet Take 1 tablet (1,000 mcg total) by mouth daily. 90 tablet 0   No current facility-administered medications for this visit.     PHYSICAL EXAMINATION: ECOG PERFORMANCE STATUS: 1 - Symptomatic but completely ambulatory Vitals:   11/30/19 0950  BP: (!) 153/78  Pulse: 82  Temp: (!) 97.3 F (36.3 C)  SpO2: 97%   Filed Weights   11/30/19 0950  Weight: 109 lb 8 oz (49.7 kg)    Physical Exam Constitutional:      Appearance: She is ill-appearing.     Comments: Cachectic   HENT:     Head: Normocephalic and atraumatic.  Eyes:     General: No scleral icterus.    Pupils: Pupils are equal, round, and reactive to light.  Cardiovascular:     Rate and Rhythm: Normal rate and regular rhythm.     Heart sounds: Normal heart sounds.  Pulmonary:     Effort: Pulmonary effort is normal. No  respiratory distress.     Breath sounds: No wheezing.  Abdominal:     General: Bowel sounds are  normal. There is no distension.     Palpations: Abdomen is soft. There is no mass.     Tenderness: There is no abdominal tenderness.  Musculoskeletal:        General: No deformity. Normal range of motion.     Cervical back: Normal range of motion and neck supple.     Comments: Lower extremity edema  Skin:    General: Skin is warm and dry.  Neurological:     Mental Status: She is alert. Mental status is at baseline.  Psychiatric:     Comments: Flat affect     LABORATORY DATA:  I have reviewed the data as listed Lab Results  Component Value Date   WBC 4.2 11/29/2019   HGB 8.9 (L) 11/29/2019   HCT 27.4 (L) 11/29/2019   MCV 77.6 (L) 11/29/2019   PLT 401 (H) 11/29/2019   Recent Labs    05/04/19 1538 05/17/19 1500 11/29/19 1155  NA 129* 132* 134*  K 3.5 3.5 3.8  CL 96* 98 103  CO2 '24 23 22  '$ GLUCOSE 114* 143* 90  BUN '15 12 20  '$ CREATININE 0.78 0.69 0.59  CALCIUM 10.0 9.4 9.3  GFRNONAA >60 >60 >60  GFRAA >60 >60 >60  PROT 7.6 7.5 8.0  ALBUMIN 3.0* 2.7* 3.2*  AST '25 28 15  '$ ALT '20 19 14  '$ ALKPHOS 61 70 54  BILITOT 0.7 0.9 0.5   Iron/TIBC/Ferritin/ %Sat    Component Value Date/Time   IRON 29 11/29/2019 1158   TIBC 188 (L) 11/29/2019 1158   FERRITIN 328 (H) 11/29/2019 1158   IRONPCTSAT 16 11/29/2019 1158     RADIOGRAPHIC STUDIES: I have personally reviewed the radiological images as listed and agreed with the findings in the report. 03/11/2018 Diagnostic Mammogram and [place]. A radiopaque BB was placed at the site of the patient's palpable lump in the lower inner left breast at far posterior depth. A spiculated hyperdense mass is seen deep to the radiopaque BB along the chest wall. No additional suspicious findings are identified within either breast. Further evaluation with ultrasound was performed. Mammographic images were processed with CAD.  On physical exam, I palpate a  firm, fixed 2-3 cm mass along the inframammary fold of the 7 o'clock position. Targeted ultrasound is performed, showing an irregular hypoechoic mass with posterior acoustic shadowing at the 7:30 position 11 cm from the nipple. It measures 2.7 x 2.5 x 2.0 cm. There is associated vascularity. This correlates well with the mammographic finding. Evaluation of the axilla demonstrates a single axillary lymph node with borderline cortical thickening of 0.4 cm. IMPRESSION: 1. Highly suspicious left breast mass corresponding with the patient's palpable lump. Recommendation is for ultrasound-guided biopsy. 2. Indeterminate left axillary lymph node. Recommendation is for ultrasound-guided biopsy.  Same as pre-op    ASSESSMENT & PLAN:  1. Microcytic anemia   2. History of breast cancer   3. Failure to thrive (0-17)   Cancer Staging Breast cancer in female [facility]) Staging form: Breast, AJCC 8th Edition - Clinical: No stage assigned - Unsigned - Pathologic stage from 06/11/2018: Stage IIA (pT2, pN0, cM0, G2, ER+, PR-, HER2-, Oncotype DX score: 27) - Signed by Earlie Server, MD on 06/11/2018  # Stage IIA pT2 pN0 ER positive invasive mammary carcinoma. Continue Arimidex 1 mg daily.  #Chronic microcytic anemia, I added iron panel which showed ferritin is at 328, iron saturation 16.  TIBC 188. Reticulocyte panel showed decreased reticulocyte hemoglobin-which can be decreased to due to iron deficiency  versus hemoglobinopathy.  For now I think is okay to continue oral iron supplementation. hemoglobinopathy evaluation was normal.  She may has alpha thalassemia which may show normal hemoglobinopathy.  I have ordered alpha thalassemia confirmation testing but she never got the blood work done. Progressively worsening of anemia, etiology unknown.  Previous flow cytometry was negative, multiple myeloma panel  showed no monoclonal proteins. Her hemoglobin is 8.9, no need for blood transfusion today.   Fatigue and weakness  may be secondary to her chronic other problems rather than anemia.  Recommend bone marrow biopsy for further evaluation the patient declined. Recommend patient to take oral vitamin B12 supplementation.  We will check her B12 at the next visit.   # Osteopenia, continue calcium and vitamin D supplements.DEXA 11/11/2018 findings revealed osteopenia.  Recommend bisphosphate treatment with Zometa Q6 months.  It was offered to her in January and she declined. #Failure to thrive, continue Megace. #Generalized pain and fibromyalgia, continue follow-up with primary care provider. Return of visit: Follow-up 4 weeks  Orders Placed This Encounter  Procedures  . Ferritin    Standing Status:   Future    Number of Occurrences:   1    Standing Expiration Date:   06/01/2020  . Iron and TIBC    Standing Status:   Future    Number of Occurrences:   1    Standing Expiration Date:   11/29/2020  . Retic Panel    Standing Status:   Future    Number of Occurrences:   1    Standing Expiration Date:   11/29/2020  . CBC with Differential/Platelet    Standing Status:   Future    Standing Expiration Date:   11/29/2020  . Comprehensive metabolic panel    Standing Status:   Future    Standing Expiration Date:   11/29/2020  . Lactate dehydrogenase    Standing Status:   Future    Standing Expiration Date:   11/29/2020  . Vitamin B12    Standing Status:   Future    Standing Expiration Date:   11/29/2020  . Folate    Standing Status:   Future    Standing Expiration Date:   11/29/2020  . Retic Panel    Standing Status:   Future    Standing Expiration Date:   11/29/2020  . TSH    Standing Status:   Future    Standing Expiration Date:   11/29/2020  . Sample to Blood Bank    Standing Status:   Future    Standing Expiration Date:   11/29/2020    We spent sufficient time to discuss many aspect of care, questions were answered to patient's satisfaction. A total of 45 minutes was spent on this visit.  With 20 minutes spent  reviewing her medical records, image and lab findings, , 20 minutes counseling the patient on the diagnosis, goal of care, chemotherapy treatments, side effects of the treatment, management of symptoms.  Additional 5 minutes was spent on answering patient's questions.   Earlie Server, MD, PhD  11/30/2019

## 2019-11-30 NOTE — Progress Notes (Signed)
Patient here for follow up. Pt reports she has no energy & sleeps most of the day. No new breast problems. Pt's appetite has increased since she started taking megace, but pt has had a significant weight loss since last office visit.

## 2019-12-26 ENCOUNTER — Telehealth: Payer: Self-pay | Admitting: Adult Health Nurse Practitioner

## 2019-12-26 NOTE — Telephone Encounter (Signed)
Called to check in on patient and set up follow up appointment.  Left VM with reason for call and call back info.   Haly Feher K. Olena Heckle NP

## 2019-12-27 ENCOUNTER — Other Ambulatory Visit: Payer: Self-pay

## 2019-12-27 ENCOUNTER — Inpatient Hospital Stay: Payer: Medicare Other

## 2019-12-27 DIAGNOSIS — D509 Iron deficiency anemia, unspecified: Secondary | ICD-10-CM

## 2019-12-27 DIAGNOSIS — C50919 Malignant neoplasm of unspecified site of unspecified female breast: Secondary | ICD-10-CM | POA: Diagnosis not present

## 2019-12-27 LAB — CBC WITH DIFFERENTIAL/PLATELET
Abs Immature Granulocytes: 0.01 10*3/uL (ref 0.00–0.07)
Basophils Absolute: 0 10*3/uL (ref 0.0–0.1)
Basophils Relative: 1 %
Eosinophils Absolute: 0 10*3/uL (ref 0.0–0.5)
Eosinophils Relative: 1 %
HCT: 29.2 % — ABNORMAL LOW (ref 36.0–46.0)
Hemoglobin: 9.6 g/dL — ABNORMAL LOW (ref 12.0–15.0)
Immature Granulocytes: 0 %
Lymphocytes Relative: 23 %
Lymphs Abs: 1 10*3/uL (ref 0.7–4.0)
MCH: 25.4 pg — ABNORMAL LOW (ref 26.0–34.0)
MCHC: 32.9 g/dL (ref 30.0–36.0)
MCV: 77.2 fL — ABNORMAL LOW (ref 80.0–100.0)
Monocytes Absolute: 0.3 10*3/uL (ref 0.1–1.0)
Monocytes Relative: 7 %
Neutro Abs: 2.9 10*3/uL (ref 1.7–7.7)
Neutrophils Relative %: 68 %
Platelets: 280 10*3/uL (ref 150–400)
RBC: 3.78 MIL/uL — ABNORMAL LOW (ref 3.87–5.11)
RDW: 19 % — ABNORMAL HIGH (ref 11.5–15.5)
WBC: 4.2 10*3/uL (ref 4.0–10.5)
nRBC: 0 % (ref 0.0–0.2)

## 2019-12-27 LAB — COMPREHENSIVE METABOLIC PANEL
ALT: 19 U/L (ref 0–44)
AST: 22 U/L (ref 15–41)
Albumin: 3.5 g/dL (ref 3.5–5.0)
Alkaline Phosphatase: 42 U/L (ref 38–126)
Anion gap: 9 (ref 5–15)
BUN: 28 mg/dL — ABNORMAL HIGH (ref 8–23)
CO2: 21 mmol/L — ABNORMAL LOW (ref 22–32)
Calcium: 9.4 mg/dL (ref 8.9–10.3)
Chloride: 104 mmol/L (ref 98–111)
Creatinine, Ser: 0.68 mg/dL (ref 0.44–1.00)
GFR calc Af Amer: >60 mL/min (ref >60)
GFR calc non Af Amer: >60 mL/min (ref >60)
Glucose, Bld: 96 mg/dL (ref 70–99)
Potassium: 3.9 mmol/L (ref 3.5–5.1)
Sodium: 134 mmol/L — ABNORMAL LOW (ref 135–145)
Total Bilirubin: 0.5 mg/dL (ref 0.3–1.2)
Total Protein: 8.2 g/dL — ABNORMAL HIGH (ref 6.5–8.1)

## 2019-12-27 LAB — RETIC PANEL
Immature Retic Fract: 13.7 % (ref 2.3–15.9)
RBC.: 3.76 MIL/uL — ABNORMAL LOW (ref 3.87–5.11)
Retic Count, Absolute: 32 10*3/uL (ref 19.0–186.0)
Retic Ct Pct: 0.9 % (ref 0.4–3.1)
Reticulocyte Hemoglobin: 30.8 pg (ref >27.9)

## 2019-12-27 LAB — TSH: TSH: 3.391 u[IU]/mL (ref 0.350–4.500)

## 2019-12-27 LAB — LACTATE DEHYDROGENASE: LDH: 127 U/L (ref 98–192)

## 2019-12-27 LAB — VITAMIN B12: Vitamin B-12: 451 pg/mL (ref 180–914)

## 2019-12-27 LAB — FOLATE: Folate: 34 ng/mL (ref >5.9)

## 2019-12-28 ENCOUNTER — Inpatient Hospital Stay: Payer: Medicare Other

## 2019-12-28 ENCOUNTER — Inpatient Hospital Stay: Payer: Medicare Other | Attending: Oncology | Admitting: Oncology

## 2020-01-09 ENCOUNTER — Telehealth: Payer: Self-pay | Admitting: *Deleted

## 2020-01-09 NOTE — Telephone Encounter (Signed)
Patient at [facility], they want to administer her first Covid vaccine this afternoon, husband called to get MD approval. Please call him back on his cell phone ASAP [phone].

## 2020-01-09 NOTE — Telephone Encounter (Signed)
And she no showed to followup appt? No follow up appt is scheduled.

## 2020-01-09 NOTE — Telephone Encounter (Signed)
No restriction from hematology oncology aspect.

## 2020-01-09 NOTE — Telephone Encounter (Signed)
Patient was in hospital with a broken arm during scheduled appointment. Husband agreed to call clinic back and re-schedule when she gets home.

## 2020-03-01 ENCOUNTER — Other Ambulatory Visit: Payer: Self-pay | Admitting: Oncology

## 2020-03-07 ENCOUNTER — Telehealth: Payer: Self-pay | Admitting: Oncology

## 2020-03-07 NOTE — Telephone Encounter (Signed)
Dr. Tasia Catchings, are you ok with ordering Mammogram for this pt.

## 2020-03-07 NOTE — Telephone Encounter (Signed)
Does she follow up with me? Ok to obtain diagnostic mammogram. She needs a follow up

## 2020-03-07 NOTE — Telephone Encounter (Deleted)
Pt has been scheduled for 11/11 @ 10:30.

## 2020-03-07 NOTE — Telephone Encounter (Signed)
Per patient's husband on a phone note from 01/09/20 that patient was not able to come to the 12/28/19 appt due to admitted to hospital and they would call to r/s.  The missed 12/28/19 appt has not been r/s yet.  Please call to r/s for lab/MD and mammogram will be ordered at that appt. since the last mammo was done on 03/29/19 with US performed on 04/07/2019 recommended Return to annual bilateral screening mammogram.

## 2020-03-07 NOTE — Telephone Encounter (Signed)
Pt husband called to request a mammo order be entered for the patient. He would like a call when it's completed so they can have it scheduled.

## 2020-03-08 NOTE — Telephone Encounter (Signed)
Done.. Pt has bee sched to RTC for lab/MD as requested. Per pt request to RTC 1 week  from11/11/21

## 2020-03-13 ENCOUNTER — Other Ambulatory Visit: Payer: Self-pay

## 2020-03-13 DIAGNOSIS — Z853 Personal history of malignant neoplasm of breast: Secondary | ICD-10-CM

## 2020-03-15 ENCOUNTER — Other Ambulatory Visit: Payer: Self-pay | Admitting: Oncology

## 2020-03-15 ENCOUNTER — Inpatient Hospital Stay: Payer: Medicare Other | Attending: Oncology

## 2020-03-15 ENCOUNTER — Encounter: Payer: Self-pay | Admitting: Oncology

## 2020-03-15 ENCOUNTER — Other Ambulatory Visit: Payer: Self-pay

## 2020-03-15 ENCOUNTER — Inpatient Hospital Stay (HOSPITAL_BASED_OUTPATIENT_CLINIC_OR_DEPARTMENT_OTHER): Payer: Medicare Other | Admitting: Oncology

## 2020-03-15 VITALS — BP 140/72 | HR 79 | Temp 98.3°F | Resp 18 | Wt 125.7 lb

## 2020-03-15 DIAGNOSIS — D649 Anemia, unspecified: Secondary | ICD-10-CM

## 2020-03-15 DIAGNOSIS — D509 Iron deficiency anemia, unspecified: Secondary | ICD-10-CM | POA: Insufficient documentation

## 2020-03-15 DIAGNOSIS — Z170 Estrogen receptor positive status [ER+]: Secondary | ICD-10-CM | POA: Insufficient documentation

## 2020-03-15 DIAGNOSIS — Z853 Personal history of malignant neoplasm of breast: Secondary | ICD-10-CM | POA: Diagnosis not present

## 2020-03-15 DIAGNOSIS — Z79811 Long term (current) use of aromatase inhibitors: Secondary | ICD-10-CM | POA: Diagnosis not present

## 2020-03-15 DIAGNOSIS — C50312 Malignant neoplasm of lower-inner quadrant of left female breast: Secondary | ICD-10-CM

## 2020-03-15 DIAGNOSIS — Z7982 Long term (current) use of aspirin: Secondary | ICD-10-CM | POA: Diagnosis not present

## 2020-03-15 DIAGNOSIS — E119 Type 2 diabetes mellitus without complications: Secondary | ICD-10-CM | POA: Diagnosis not present

## 2020-03-15 DIAGNOSIS — R5383 Other fatigue: Secondary | ICD-10-CM

## 2020-03-15 DIAGNOSIS — Z7952 Long term (current) use of systemic steroids: Secondary | ICD-10-CM | POA: Insufficient documentation

## 2020-03-15 DIAGNOSIS — M8580 Other specified disorders of bone density and structure, unspecified site: Secondary | ICD-10-CM | POA: Insufficient documentation

## 2020-03-15 DIAGNOSIS — Z833 Family history of diabetes mellitus: Secondary | ICD-10-CM | POA: Insufficient documentation

## 2020-03-15 DIAGNOSIS — Z803 Family history of malignant neoplasm of breast: Secondary | ICD-10-CM | POA: Diagnosis not present

## 2020-03-15 DIAGNOSIS — Z8249 Family history of ischemic heart disease and other diseases of the circulatory system: Secondary | ICD-10-CM | POA: Diagnosis not present

## 2020-03-15 DIAGNOSIS — K219 Gastro-esophageal reflux disease without esophagitis: Secondary | ICD-10-CM | POA: Diagnosis not present

## 2020-03-15 DIAGNOSIS — Z79899 Other long term (current) drug therapy: Secondary | ICD-10-CM | POA: Diagnosis not present

## 2020-03-15 DIAGNOSIS — I10 Essential (primary) hypertension: Secondary | ICD-10-CM | POA: Insufficient documentation

## 2020-03-15 DIAGNOSIS — M797 Fibromyalgia: Secondary | ICD-10-CM | POA: Diagnosis not present

## 2020-03-15 LAB — CBC WITH DIFFERENTIAL/PLATELET
Abs Immature Granulocytes: 0.06 10*3/uL (ref 0.00–0.07)
Basophils Absolute: 0 10*3/uL (ref 0.0–0.1)
Basophils Relative: 1 %
Eosinophils Absolute: 0.1 10*3/uL (ref 0.0–0.5)
Eosinophils Relative: 1 %
HCT: 34.3 % — ABNORMAL LOW (ref 36.0–46.0)
Hemoglobin: 11 g/dL — ABNORMAL LOW (ref 12.0–15.0)
Immature Granulocytes: 1 %
Lymphocytes Relative: 22 %
Lymphs Abs: 1.3 10*3/uL (ref 0.7–4.0)
MCH: 27 pg (ref 26.0–34.0)
MCHC: 32.1 g/dL (ref 30.0–36.0)
MCV: 84.3 fL (ref 80.0–100.0)
Monocytes Absolute: 0.4 10*3/uL (ref 0.1–1.0)
Monocytes Relative: 7 %
Neutro Abs: 4 10*3/uL (ref 1.7–7.7)
Neutrophils Relative %: 68 %
Platelets: 314 10*3/uL (ref 150–400)
RBC: 4.07 MIL/uL (ref 3.87–5.11)
RDW: 15.7 % — ABNORMAL HIGH (ref 11.5–15.5)
WBC: 5.9 10*3/uL (ref 4.0–10.5)
nRBC: 0 % (ref 0.0–0.2)

## 2020-03-15 LAB — COMPREHENSIVE METABOLIC PANEL
ALT: 15 U/L (ref 0–44)
AST: 17 U/L (ref 15–41)
Albumin: 3.5 g/dL (ref 3.5–5.0)
Alkaline Phosphatase: 48 U/L (ref 38–126)
Anion gap: 10 (ref 5–15)
BUN: 31 mg/dL — ABNORMAL HIGH (ref 8–23)
CO2: 23 mmol/L (ref 22–32)
Calcium: 9.9 mg/dL (ref 8.9–10.3)
Chloride: 103 mmol/L (ref 98–111)
Creatinine, Ser: 0.66 mg/dL (ref 0.44–1.00)
GFR, Estimated: >60 mL/min (ref >60)
Glucose, Bld: 234 mg/dL — ABNORMAL HIGH (ref 70–99)
Potassium: 4 mmol/L (ref 3.5–5.1)
Sodium: 136 mmol/L (ref 135–145)
Total Bilirubin: 0.6 mg/dL (ref 0.3–1.2)
Total Protein: 7.7 g/dL (ref 6.5–8.1)

## 2020-03-15 LAB — IRON AND TIBC
Iron: 85 ug/dL (ref 28–170)
Saturation Ratios: 29 % (ref 10.4–31.8)
TIBC: 295 ug/dL (ref 250–450)
UIBC: 210 ug/dL

## 2020-03-15 LAB — FERRITIN: Ferritin: 229 ng/mL (ref 11–307)

## 2020-03-15 LAB — VITAMIN B12: Vitamin B-12: 690 pg/mL (ref 180–914)

## 2020-03-15 MED ORDER — ANASTROZOLE 1 MG PO TABS
1.0000 mg | ORAL_TABLET | Freq: Every day | ORAL | 1 refills | Status: DC
Start: 2020-03-15 — End: 2020-08-10

## 2020-03-15 NOTE — Progress Notes (Signed)
Hematology/Oncology follow up note [facility] [phone] [phone]   Patient Care Team: Dion Body, MD as PCP - General (Family Medicine) Earlie Server, MD as Consulting Physician (Hematology and Oncology)  REASON FOR VISIT:  Follow up for management  of Stage IIA breast cancer  HISTORY OF PRESENTING ILLNESS:  Jackie Allison is a  79 y.o.  female with PMH listed below who was referred to me to reestablish care for breast cancer and anemia evaluation.  #  04/13/2018 stage IIa breast cancer pT2 pN0 Grade 2, ER positive, PR negative HER-2 negative.No Lymphovascular invasion # 06/19/2018 wound infection. Surgical wound culture positive for MRSA, as well as enterococcus faecalis. Patient was discharged on 06/22/2018. Oncotype DX recurrence score came back at 27, predicting distant recurrence risk at 9 years with tamoxifen alone around 16%.  Group average absolute chemotherapy benefit >15%.  Patient declined chemotherapy.  # finished adjuvant radiation-last RT 09/27/2018 #June 2020 start adjuvant antiestrogen treatment with Arimidex 1 mg daily  #Osteopenic, received a dental clearance for Zometa.  Started on Zometa 4 mg every 6 months.  Received last dose on 11/16/19.  Patient was offered Zometa on 05/04/2019 and she declined. # Bone pain, 03/04/2019 bone scan whole body findings fever degenerated changes or related to previous surgery.  No worrisome findings for osseous metastatic disease  # Patient was last seen by me in January 2021.  At that time patient has anemia and has had extensive blood work-up which did not reveal etiology of the anemia.  I had recommended bone marrow biopsy  at that time and patient declined.  She also had diffuse bone pain, bone scan was negative and I recommend patient to temporarily hold off Arimidex.  After that visit she transferred her oncology care to [facility] and was seen by Dr. Rolley Sims.  Patient was recommended to  continue Arimidex which she has been doing since then.  Patient had admissions due to change of mental status, UTI, failure to thrive weight loss, decreased appetite. Patient was found to have progressively worsening of anemia, fatigue and was referred back to establish care with me for further evaluation.  Today patient was accompanied by her husband.  She states that she wants to transfer her care back to me.  11/30/2022 reestablish care with me and then did not come for follow-up  INTERVAL HISTORY Jackie Allison is a 79 y.o. female who has above history reviewed by me today presents to reestablish care for anemia, follow-up for breast cancer.  #11/30/2022 reestablish care with me and then did not come for follow-up Patient re patient to ports that she had a fall and fractured her left forearm fracture status post fixation. She takes oral iron supplementation.  Today she feels " so so".  She was accompanied by her husband She has gained some weight and today weighs 125 pounds.  Appetite is fair. Lower extremity swelling has improved.  Review of Systems  Constitutional: Positive for fatigue. Negative for appetite change, chills, fever and unexpected weight change.  HENT:   Negative for hearing loss and voice change.   Eyes: Negative for eye problems.  Respiratory: Negative for chest tightness and cough.   Cardiovascular: Negative for chest pain and leg swelling.  Gastrointestinal: Negative for abdominal distention, abdominal pain and blood in stool.  Endocrine: Negative for hot flashes.  Genitourinary: Negative for difficulty urinating and frequency.   Musculoskeletal: Positive for arthralgias.       Chronic back pain Generalized bone pain  Skin: Negative for itching and rash.  Neurological: Negative for extremity weakness.  Hematological: Negative for adenopathy.  Psychiatric/Behavioral: Negative for confusion.    MEDICAL HISTORY:  Past Medical History:  Diagnosis Date  . Anemia     chronic microcytic anemia  . Breast abscess 06/20/2018  . Breast cancer ([place]) 03/2018   Invasive Mammary  . Cancer ([place]) 2019   left breast  . Diabetes mellitus without complication ([place])   . GERD (gastroesophageal reflux disease)   . Glaucoma   . Hypercholesterolemia   . Hypertension     SURGICAL HISTORY: Past Surgical History:  Procedure Laterality Date  . BACK SURGERY    . BREAST BIOPSY Left 02/2018  . BREAST EXCISIONAL BIOPSY Right ??  . BREAST LUMPECTOMY Left 04/03/2018   Invasive Mammary CA Neg margins  . CATARACT EXTRACTION W/ INTRAOCULAR LENS  IMPLANT, BILATERAL    . EYE SURGERY Bilateral    cataract extraction  . INCISION AND DRAINAGE ABSCESS Left 04/28/2018   Procedure: INCISION AND DRAINAGE ABSCESS;  Surgeon: Benjamine Sprague, DO;  Location: [facility] ORS;  Service: General;  Laterality: Left;  . INCISION AND DRAINAGE ABSCESS Left 06/20/2018   Procedure: INCISION AND DRAINAGE ABSCESS;  Surgeon: Herbert Pun, MD;  Location: [facility] ORS;  Service: General;  Laterality: Left;  . LUMBAR FUSION  2002?  . LUMBAR LAMINECTOMY  1997   titanium in back  . PARTIAL MASTECTOMY WITH AXILLARY SENTINEL LYMPH NODE BIOPSY Left 04/13/2018   Procedure: PARTIAL MASTECTOMY WITH AXILLARY SENTINEL LYMPH NODE BIOPSY  (No Needle Loc);  Surgeon: Benjamine Sprague, DO;  Location: [facility] ORS;  Service: General;  Laterality: Left;  . RETINAL DETACHMENT SURGERY Bilateral 2009  . TUBAL LIGATION      SOCIAL HISTORY: Social History   Socioeconomic History  . Marital status: Married    Spouse name: [name]  . Number of children: Not on file  . Years of education: Not on file  . Highest education level: Not on file  Occupational History  . Occupation: teacher/real estate  Tobacco Use  . Smoking status: Never Smoker  . Smokeless tobacco: Never Used  Vaping Use  . Vaping Use: Never used  Substance and Sexual Activity  . Alcohol use: Never  . Drug use: Never  . Sexual activity: Not on file  Other  Topics Concern  . Not on file  Social History Narrative  . Not on file   Social Determinants of Health   Financial Resource Strain:   . Difficulty of Paying Living Expenses: Not on file  Food Insecurity:   . Worried About Charity fundraiser in the Last Year: Not on file  . Ran Out of Food in the Last Year: Not on file  Transportation Needs:   . Lack of Transportation (Medical): Not on file  . Lack of Transportation (Non-Medical): Not on file  Physical Activity:   . Days of Exercise per Week: Not on file  . Minutes of Exercise per Session: Not on file  Stress:   . Feeling of Stress : Not on file  Social Connections:   . Frequency of Communication with Friends and Family: Not on file  . Frequency of Social Gatherings with Friends and Family: Not on file  . Attends Religious Services: Not on file  . Active Member of Clubs or Organizations: Not on file  . Attends Archivist Meetings: Not on file  . Marital Status: Not on file  Intimate Partner Violence:   . Fear of Current or  Ex-Partner: Not on file  . Emotionally Abused: Not on file  . Physically Abused: Not on file  . Sexually Abused: Not on file    FAMILY HISTORY: Family History  Problem Relation Age of Onset  . Diabetes Sister   . Diabetes Brother   . Diabetes Sister   . Breast cancer Other   . Hypertension Mother   . Glaucoma Mother   . Heart attack Father     ALLERGIES:  is allergic to shellfish allergy, levofloxacin, iodine, and sulfa antibiotics.  MEDICATIONS:  Current Outpatient Medications  Medication Sig Dispense Refill  . acetaminophen (TYLENOL) 500 MG tablet Take 500 mg by mouth every 6 (six) hours as needed for moderate pain.     Marland Kitchen amLODipine (NORVASC) 2.5 MG tablet Take by mouth.    Marland Kitchen anastrozole (ARIMIDEX) 1 MG tablet Take 1 tablet (1 mg total) by mouth daily. 90 tablet 1  . aspirin 81 MG EC tablet Take by mouth.    Marland Kitchen atorvastatin (LIPITOR) 10 MG tablet Take 10 mg by mouth daily.    .  benazepril (LOTENSIN) 40 MG tablet Take 40 mg by mouth daily.    . bisacodyl (DULCOLAX) 5 MG EC tablet Take 5 mg by mouth daily as needed for moderate constipation.    . brimonidine (ALPHAGAN) 0.2 % ophthalmic solution     . bumetanide (BUMEX) 1 MG tablet Take 1 mg by mouth daily.    . carvedilol (COREG) 25 MG tablet Take 1 tablet by mouth 2 (two) times daily with a meal.    . dorzolamide-timolol (COSOPT) 22.3-6.8 MG/ML ophthalmic solution     . DULoxetine (CYMBALTA) 30 MG capsule Take 30 mg by mouth daily.    . eszopiclone (LUNESTA) 1 MG TABS tablet Take 1 tablet (1 mg total) by mouth at bedtime as needed for sleep. Take immediately before bedtime 30 tablet 0  . ferrous sulfate 325 (65 FE) MG EC tablet TAKE 1 TABLET (325 MG TOTAL) BY MOUTH 2 (TWO) TIMES DAILY WITH A MEAL. 60 tablet 1  . hydrALAZINE (APRESOLINE) 100 MG tablet Take 100 mg by mouth 3 (three) times daily.   3  . latanoprost (XALATAN) 0.005 % ophthalmic solution     . loperamide (IMODIUM A-D) 2 MG tablet Take 1 tablet (2 mg total) by mouth 4 (four) times daily as needed for diarrhea or loose stools. 30 tablet 0  . megestrol (MEGACE) 40 MG/ML suspension Take 20 mLs by mouth daily.    . mirtazapine (REMERON) 7.5 MG tablet Take 7.5 mg by mouth daily.    . Multiple Vitamin (MULTI-VITAMINS) TABS Take 1 tablet by mouth daily.     Marland Kitchen omeprazole (PRILOSEC) 40 MG capsule Take 40 mg by mouth daily.    . potassium chloride SA (KLOR-CON) 20 MEQ tablet Take 20 mEq by mouth daily.    . predniSONE (DELTASONE) 10 MG tablet Take by mouth as directed.    . vitamin B-12 (CYANOCOBALAMIN) 1000 MCG tablet Take 1 tablet (1,000 mcg total) by mouth daily. 90 tablet 0   No current facility-administered medications for this visit.     PHYSICAL EXAMINATION: ECOG PERFORMANCE STATUS: 1 - Symptomatic but completely ambulatory Vitals:   03/15/20 0923  BP: 140/72  Pulse: 79  Resp: 18  Temp: 98.3 F (36.8 C)  SpO2: 100%   Filed Weights   03/15/20 0923   Weight: 125 lb 11.2 oz (57 kg)    Physical Exam Constitutional:      Appearance: She is ill-appearing.  Comments: Cachectic   HENT:     Head: Normocephalic and atraumatic.  Eyes:     General: No scleral icterus.    Pupils: Pupils are equal, round, and reactive to light.  Cardiovascular:     Rate and Rhythm: Normal rate and regular rhythm.     Heart sounds: Normal heart sounds.  Pulmonary:     Effort: Pulmonary effort is normal. No respiratory distress.     Breath sounds: No wheezing.  Abdominal:     General: Bowel sounds are normal. There is no distension.     Palpations: Abdomen is soft. There is no mass.     Tenderness: There is no abdominal tenderness.  Musculoskeletal:        General: No deformity. Normal range of motion.     Cervical back: Normal range of motion and neck supple.     Comments: Lower extremity edema  Skin:    General: Skin is warm and dry.  Neurological:     Mental Status: She is alert. Mental status is at baseline.  Psychiatric:     Comments: Flat affect     LABORATORY DATA:  I have reviewed the data as listed Lab Results  Component Value Date   WBC 5.9 03/15/2020   HGB 11.0 (L) 03/15/2020   HCT 34.3 (L) 03/15/2020   MCV 84.3 03/15/2020   PLT 314 03/15/2020   Recent Labs    05/17/19 1500 05/17/19 1500 11/29/19 1155 12/27/19 1009 03/15/20 0855  NA 132*   < > 134* 134* 136  K 3.5   < > 3.8 3.9 4.0  CL 98   < > 103 104 103  CO2 23   < > 22 21* 23  GLUCOSE 143*   < > 90 96 234*  BUN 12   < > 20 28* 31*  CREATININE 0.69   < > 0.59 0.68 0.66  CALCIUM 9.4   < > 9.3 9.4 9.9  GFRNONAA >60   < > >60 >60 >60  GFRAA >60  --  >60 >60  --   PROT 7.5   < > 8.0 8.2* 7.7  ALBUMIN 2.7*   < > 3.2* 3.5 3.5  AST 28   < > $R'15 22 17  'Tl$ ALT 19   < > $R'14 19 15  'Pf$ ALKPHOS 70   < > 54 42 48  BILITOT 0.9   < > 0.5 0.5 0.6   < > = values in this interval not displayed.   Iron/TIBC/Ferritin/ %Sat    Component Value Date/Time   IRON 85 03/15/2020 0855    TIBC 295 03/15/2020 0855   FERRITIN 229 03/15/2020 0855   IRONPCTSAT 29 03/15/2020 0855     RADIOGRAPHIC STUDIES: I have personally reviewed the radiological images as listed and agreed with the findings in the report. 03/11/2018 Diagnostic Mammogram and [place]. A radiopaque BB was placed at the site of the patient's palpable lump in the lower inner left breast at far posterior depth. A spiculated hyperdense mass is seen deep to the radiopaque BB along the chest wall. No additional suspicious findings are identified within either breast. Further evaluation with ultrasound was performed. Mammographic images were processed with CAD.  On physical exam, I palpate a firm, fixed 2-3 cm mass along the inframammary fold of the 7 o'clock position. Targeted ultrasound is performed, showing an irregular hypoechoic mass with posterior acoustic shadowing at the 7:30 position 11 cm from the nipple. It measures 2.7 x 2.5 x 2.0 cm. There  is associated vascularity. This correlates well with the mammographic finding. Evaluation of the axilla demonstrates a single axillary lymph node with borderline cortical thickening of 0.4 cm. IMPRESSION: 1. Highly suspicious left breast mass corresponding with the patient's palpable lump. Recommendation is for ultrasound-guided biopsy. 2. Indeterminate left axillary lymph node. Recommendation is for ultrasound-guided biopsy.  Same as pre-op    ASSESSMENT & PLAN:  1. Malignant neoplasm of lower-inner quadrant of left breast in female, estrogen receptor positive (HCC)   2. History of breast cancer   3. Normocytic anemia   Cancer Staging Malignant neoplasm of lower-inner quadrant of left breast in female, estrogen receptor positive (HCC) Staging form: Breast, AJCC 8th Edition - Clinical: No stage assigned - Unsigned - Pathologic stage from 06/11/2018: Stage IIA (pT2, pN0, cM0, G2, ER+, PR-, HER2-, Oncotype DX score: 27) - Signed by Rickard Patience, MD on 06/11/2018  # Stage IIA pT2  pN0 ER positive invasive mammary carcinoma. Patient reports that she has been off Arimidex for amount of time.  Per patient " off for a few months".  We discussed the rationale and potential side effects of Arimidex. Patient previously tolerated with moderate difficulties including arthralgia and bone pain. I discussed with her about alternative options of tamoxifen.  Potential side effects was discussed with her. Patient prefers to have another trial with Arimidex.  If she cannot tolerate, will further discuss about switching to tamoxifen. A prescription of her Arimidex sent to her pharmacy Obtain diagnostic mammogram.   # microcytic anemia, she has bene on oral iron supplementation Labs are reviewed and discussed with patient. Hb has improved to 11, iron panel has improved as well.   # Osteopenia, continue calcium and vitamin D supplements.DEXA 11/11/2018 findings revealed osteopenia.  Recommend bisphosphate treatment with Zometa Q6 months.  It was offered to her in January and she declined. #Generalized pain and fibromyalgia,  Follow up with primary care provider. Return of visit: Follow-up 4 weeks  Orders Placed This Encounter  Procedures  . MM DIAG BREAST TOMO BILATERAL    Standing Status:   Future    Standing Expiration Date:   03/15/2021    Order Specific Question:   Reason for Exam (SYMPTOM  OR DIAGNOSIS REQUIRED)    Answer:   history of breast cancer    Order Specific Question:   Preferred imaging location?    Answer:   [facility]  . US Breast Limited Uni Left Inc Axilla    Standing Status:   Future    Standing Expiration Date:   03/15/2021    Order Specific Question:   Reason for Exam (SYMPTOM  OR DIAGNOSIS REQUIRED)    Answer:   history of breast cancer    Order Specific Question:   Preferred imaging location?    Answer:   [facility]  . US Breast Limited Uni Right Inc Axilla    Standing Status:   Future    Standing Expiration Date:   03/15/2021    Order  Specific Question:   Reason for Exam (SYMPTOM  OR DIAGNOSIS REQUIRED)    Answer:   history of breast cancer    Order Specific Question:   Preferred imaging location?    Answer:   [facility]  . CBC with Differential/Platelet    Standing Status:   Future    Standing Expiration Date:   03/15/2021  . Comprehensive metabolic panel    Standing Status:   Future    Standing Expiration Date:   03/15/2021  .  VITAMIN D 25 Hydroxy (Vit-D Deficiency, Fractures)    Standing Status:   Future    Standing Expiration Date:   03/15/2021    We spent sufficient time to discuss many aspect of care, questions were answered to patient's satisfaction. A total of 45 minutes was spent on this visit.  With 20 minutes spent reviewing her medical records, image and lab findings, , 20 minutes counseling the patient on the diagnosis, goal of care, chemotherapy treatments, side effects of the treatment, management of symptoms.  Additional 5 minutes was spent on answering patient's questions.   Earlie Server, MD, PhD  03/15/2020

## 2020-03-15 NOTE — Progress Notes (Signed)
Pt here for follow up. No new concerns voiced.   

## 2020-03-28 LAB — ALPHA-THALASSEMIA GENOTYPR

## 2020-03-30 ENCOUNTER — Other Ambulatory Visit: Payer: Self-pay

## 2020-03-30 ENCOUNTER — Ambulatory Visit
Admission: RE | Admit: 2020-03-30 | Discharge: 2020-03-30 | Disposition: A | Discharge status: Home / Self Care | Payer: Medicare Other | Source: Ambulatory Visit | Attending: Oncology | Admitting: Oncology

## 2020-03-30 DIAGNOSIS — Z853 Personal history of malignant neoplasm of breast: Secondary | ICD-10-CM

## 2020-03-30 DIAGNOSIS — D649 Anemia, unspecified: Secondary | ICD-10-CM

## 2020-03-31 ENCOUNTER — Encounter: Payer: Self-pay | Admitting: Oncology

## 2020-03-31 DIAGNOSIS — Z7189 Other specified counseling: Secondary | ICD-10-CM | POA: Insufficient documentation

## 2020-04-04 ENCOUNTER — Other Ambulatory Visit: Payer: Self-pay

## 2020-04-04 ENCOUNTER — Ambulatory Visit (INDEPENDENT_AMBULATORY_CARE_PROVIDER_SITE_OTHER): Payer: Medicare Other | Admitting: Podiatry

## 2020-04-04 ENCOUNTER — Ambulatory Visit: Payer: Medicare Other

## 2020-04-04 DIAGNOSIS — G5792 Unspecified mononeuropathy of left lower limb: Secondary | ICD-10-CM | POA: Diagnosis not present

## 2020-04-04 MED ORDER — DEXAMETHASONE SODIUM PHOSPHATE 120 MG/30ML IJ SOLN
2.0000 mg | Freq: Once | INTRAMUSCULAR | Status: AC
  Administered 2020-04-04: 2 mg via INTRA_ARTICULAR

## 2020-04-04 NOTE — Progress Notes (Signed)
She presents today with her husband with a chief complaint of pain to the dorsal lateral aspect of her left foot where her brother had dropped a plate on her foot over a year ago.  States that the pain has been severe ever since.  This is limiting her ability to walk.  She states that the pain radiates from the dorsal midfoot out to the toes minimally up the leg.  Objective: Vital signs are stable alert and oriented x3.  Pulses are palpable.  She has no pain on range of motion of the midfoot though she does have pain on direct palpation of the intermediate dorsal cutaneous nerve with radiating pain to the toes 2 3 and 4.  No radiographs were taken today.  Assessment: Neuritis dorsal foot left.  Plan: Injected 4 mg of dexamethasone with local anesthetic to the nerve.  I will follow-up with [place] her in 1 month.  May need to consider x-rays at that time.

## 2020-04-10 ENCOUNTER — Inpatient Hospital Stay: Payer: Medicare Other | Attending: Oncology

## 2020-04-10 DIAGNOSIS — R5383 Other fatigue: Secondary | ICD-10-CM | POA: Insufficient documentation

## 2020-04-10 DIAGNOSIS — Z79899 Other long term (current) drug therapy: Secondary | ICD-10-CM | POA: Diagnosis not present

## 2020-04-10 DIAGNOSIS — M8580 Other specified disorders of bone density and structure, unspecified site: Secondary | ICD-10-CM | POA: Insufficient documentation

## 2020-04-10 DIAGNOSIS — C50312 Malignant neoplasm of lower-inner quadrant of left female breast: Secondary | ICD-10-CM | POA: Diagnosis not present

## 2020-04-10 DIAGNOSIS — G8929 Other chronic pain: Secondary | ICD-10-CM | POA: Insufficient documentation

## 2020-04-10 DIAGNOSIS — E119 Type 2 diabetes mellitus without complications: Secondary | ICD-10-CM | POA: Insufficient documentation

## 2020-04-10 DIAGNOSIS — I10 Essential (primary) hypertension: Secondary | ICD-10-CM | POA: Insufficient documentation

## 2020-04-10 DIAGNOSIS — Z170 Estrogen receptor positive status [ER+]: Secondary | ICD-10-CM | POA: Diagnosis not present

## 2020-04-10 DIAGNOSIS — E7800 Pure hypercholesterolemia, unspecified: Secondary | ICD-10-CM | POA: Diagnosis not present

## 2020-04-10 DIAGNOSIS — M2550 Pain in unspecified joint: Secondary | ICD-10-CM | POA: Insufficient documentation

## 2020-04-10 DIAGNOSIS — K219 Gastro-esophageal reflux disease without esophagitis: Secondary | ICD-10-CM | POA: Insufficient documentation

## 2020-04-10 DIAGNOSIS — Z7952 Long term (current) use of systemic steroids: Secondary | ICD-10-CM | POA: Insufficient documentation

## 2020-04-10 DIAGNOSIS — Z7982 Long term (current) use of aspirin: Secondary | ICD-10-CM | POA: Diagnosis not present

## 2020-04-10 DIAGNOSIS — Z79811 Long term (current) use of aromatase inhibitors: Secondary | ICD-10-CM | POA: Insufficient documentation

## 2020-04-10 DIAGNOSIS — M549 Dorsalgia, unspecified: Secondary | ICD-10-CM | POA: Insufficient documentation

## 2020-04-10 DIAGNOSIS — D649 Anemia, unspecified: Secondary | ICD-10-CM | POA: Insufficient documentation

## 2020-04-10 LAB — VITAMIN D 25 HYDROXY (VIT D DEFICIENCY, FRACTURES): Vit D, 25-Hydroxy: 40.77 ng/mL (ref 30–100)

## 2020-04-10 LAB — COMPREHENSIVE METABOLIC PANEL
ALT: 16 U/L (ref 0–44)
AST: 17 U/L (ref 15–41)
Albumin: 3.4 g/dL — ABNORMAL LOW (ref 3.5–5.0)
Alkaline Phosphatase: 42 U/L (ref 38–126)
Anion gap: 9 (ref 5–15)
BUN: 21 mg/dL (ref 8–23)
CO2: 23 mmol/L (ref 22–32)
Calcium: 9.2 mg/dL (ref 8.9–10.3)
Chloride: 102 mmol/L (ref 98–111)
Creatinine, Ser: 0.7 mg/dL (ref 0.44–1.00)
GFR, Estimated: >60 mL/min (ref >60)
Glucose, Bld: 220 mg/dL — ABNORMAL HIGH (ref 70–99)
Potassium: 3.8 mmol/L (ref 3.5–5.1)
Sodium: 134 mmol/L — ABNORMAL LOW (ref 135–145)
Total Bilirubin: 0.7 mg/dL (ref 0.3–1.2)
Total Protein: 7.1 g/dL (ref 6.5–8.1)

## 2020-04-10 LAB — CBC WITH DIFFERENTIAL/PLATELET
Abs Immature Granulocytes: 0.07 10*3/uL (ref 0.00–0.07)
Basophils Absolute: 0 10*3/uL (ref 0.0–0.1)
Basophils Relative: 0 %
Eosinophils Absolute: 0 10*3/uL (ref 0.0–0.5)
Eosinophils Relative: 1 %
HCT: 33.9 % — ABNORMAL LOW (ref 36.0–46.0)
Hemoglobin: 10.8 g/dL — ABNORMAL LOW (ref 12.0–15.0)
Immature Granulocytes: 1 %
Lymphocytes Relative: 22 %
Lymphs Abs: 1.1 10*3/uL (ref 0.7–4.0)
MCH: 27.1 pg (ref 26.0–34.0)
MCHC: 31.9 g/dL (ref 30.0–36.0)
MCV: 85.2 fL (ref 80.0–100.0)
Monocytes Absolute: 0.3 10*3/uL (ref 0.1–1.0)
Monocytes Relative: 6 %
Neutro Abs: 3.5 10*3/uL (ref 1.7–7.7)
Neutrophils Relative %: 70 %
Platelets: 271 10*3/uL (ref 150–400)
RBC: 3.98 MIL/uL (ref 3.87–5.11)
RDW: 15.6 % — ABNORMAL HIGH (ref 11.5–15.5)
WBC: 5 10*3/uL (ref 4.0–10.5)
nRBC: 0 % (ref 0.0–0.2)

## 2020-04-12 ENCOUNTER — Other Ambulatory Visit: Payer: PRIVATE HEALTH INSURANCE

## 2020-04-12 ENCOUNTER — Inpatient Hospital Stay (HOSPITAL_BASED_OUTPATIENT_CLINIC_OR_DEPARTMENT_OTHER): Payer: Medicare Other | Admitting: Oncology

## 2020-04-12 ENCOUNTER — Encounter: Payer: Self-pay | Admitting: Oncology

## 2020-04-12 VITALS — BP 124/63 | HR 76 | Temp 98.8°F | Resp 16 | Wt 124.7 lb

## 2020-04-12 DIAGNOSIS — D649 Anemia, unspecified: Secondary | ICD-10-CM

## 2020-04-12 DIAGNOSIS — C50312 Malignant neoplasm of lower-inner quadrant of left female breast: Secondary | ICD-10-CM | POA: Diagnosis not present

## 2020-04-12 DIAGNOSIS — M8580 Other specified disorders of bone density and structure, unspecified site: Secondary | ICD-10-CM

## 2020-04-12 DIAGNOSIS — Z853 Personal history of malignant neoplasm of breast: Secondary | ICD-10-CM

## 2020-04-12 NOTE — Progress Notes (Signed)
Patient denies new problems/concerns today.   °

## 2020-04-12 NOTE — Progress Notes (Signed)
Hematology/Oncology follow up note [name] [phone] [phone]   Patient Care Team: Dion Body, MD as PCP - General (Family Medicine) Earlie Server, MD as Consulting Physician (Hematology and Oncology)  REASON FOR VISIT:  Follow up for management  of Stage IIA breast cancer  HISTORY OF PRESENTING ILLNESS:  Jackie Allison is a  79 y.o.  female with PMH listed below who was referred to me to reestablish care for breast cancer and anemia evaluation.  #  04/13/2018 stage IIa breast cancer pT2 pN0 Grade 2, ER positive, PR negative HER-2 negative.No Lymphovascular invasion # 06/19/2018 wound infection. Surgical wound culture positive for MRSA, as well as enterococcus faecalis. Patient was discharged on 06/22/2018. Oncotype DX recurrence score came back at 27, predicting distant recurrence risk at 9 years with tamoxifen alone around 16%.  Group average absolute chemotherapy benefit >15%.  Patient declined chemotherapy.  # finished adjuvant radiation-last RT 09/27/2018 #June 2020 start adjuvant antiestrogen treatment with Arimidex 1 mg daily  #Osteopenic, received a dental clearance for Zometa.  Started on Zometa 4 mg every 6 months.  Received last dose on 11/16/19.  Patient was offered Zometa on 05/04/2019 and she declined. # Bone pain, 03/04/2019 bone scan whole body findings fever degenerated changes or related to previous surgery.  No worrisome findings for osseous metastatic disease  # Patient was last seen by me in January 2021.  At that time patient has anemia and has had extensive blood work-up which did not reveal etiology of the anemia.  I had recommended bone marrow biopsy  at that time and patient declined.  She also had diffuse bone pain, bone scan was negative and I recommend patient to temporarily hold off Arimidex.  After that visit she transferred her oncology care to [facility] and was seen by Dr. Rolley Sims.  Patient was recommended to  continue Arimidex which she has been doing since then.  Patient had admissions due to change of mental status, UTI, failure to thrive weight loss, decreased appetite. Patient was found to have progressively worsening of anemia, fatigue and was referred back to establish care with me for further evaluation.  Today patient was accompanied by her husband.  She states that she wants to transfer her care back to me.  11/30/2019 reestablish care with me and then did not come for follow-up Patient re patient to ports that she had a fall and fractured her left forearm fracture status post fixation.  INTERVAL HISTORY Jackie Allison is a 79 y.o. female who has above history reviewed by me today presents to reestablish care for anemia, follow-up for breast cancer.  #Nov 2021 resumed on Arimidex inhibitor, she reports feeling no big difference after taking the medication.  She takes oral iron supplementation.  Weight is stable. Chronic aches and pains no changed.   Review of Systems  Constitutional: Positive for fatigue. Negative for appetite change, chills, fever and unexpected weight change.  HENT:   Negative for hearing loss and voice change.   Eyes: Negative for eye problems.  Respiratory: Negative for chest tightness and cough.   Cardiovascular: Negative for chest pain and leg swelling.  Gastrointestinal: Negative for abdominal distention, abdominal pain and blood in stool.  Endocrine: Negative for hot flashes.  Genitourinary: Negative for difficulty urinating and frequency.   Musculoskeletal: Positive for arthralgias.       Chronic back pain Generalized bone pain  Skin: Negative for itching and rash.  Neurological: Negative for extremity weakness.  Hematological: Negative for  adenopathy.  Psychiatric/Behavioral: Negative for confusion.    MEDICAL HISTORY:  Past Medical History:  Diagnosis Date  . Anemia    chronic microcytic anemia  . Breast abscess 06/20/2018  . Breast cancer ([place])  03/2018   Invasive Mammary  . Cancer ([place]) 2019   left breast  . Diabetes mellitus without complication ([place])   . GERD (gastroesophageal reflux disease)   . Glaucoma   . Hypercholesterolemia   . Hypertension     SURGICAL HISTORY: Past Surgical History:  Procedure Laterality Date  . BACK SURGERY    . BREAST BIOPSY Left 02/2018  . BREAST EXCISIONAL BIOPSY Right ??  . BREAST LUMPECTOMY Left 04/03/2018   Invasive Mammary CA Neg margins  . CATARACT EXTRACTION W/ INTRAOCULAR LENS  IMPLANT, BILATERAL    . EYE SURGERY Bilateral    cataract extraction  . INCISION AND DRAINAGE ABSCESS Left 04/28/2018   Procedure: INCISION AND DRAINAGE ABSCESS;  Surgeon: Benjamine Sprague, DO;  Location: [facility] ORS;  Service: General;  Laterality: Left;  . INCISION AND DRAINAGE ABSCESS Left 06/20/2018   Procedure: INCISION AND DRAINAGE ABSCESS;  Surgeon: Herbert Pun, MD;  Location: [facility] ORS;  Service: General;  Laterality: Left;  . LUMBAR FUSION  2002?  . LUMBAR LAMINECTOMY  1997   titanium in back  . PARTIAL MASTECTOMY WITH AXILLARY SENTINEL LYMPH NODE BIOPSY Left 04/13/2018   Procedure: PARTIAL MASTECTOMY WITH AXILLARY SENTINEL LYMPH NODE BIOPSY  (No Needle Loc);  Surgeon: Benjamine Sprague, DO;  Location: [facility] ORS;  Service: General;  Laterality: Left;  . RETINAL DETACHMENT SURGERY Bilateral 2009  . TUBAL LIGATION      SOCIAL HISTORY: Social History   Socioeconomic History  . Marital status: Married    Spouse name: [name]  . Number of children: Not on file  . Years of education: Not on file  . Highest education level: Not on file  Occupational History  . Occupation: teacher/real estate  Tobacco Use  . Smoking status: Never Smoker  . Smokeless tobacco: Never Used  Vaping Use  . Vaping Use: Never used  Substance and Sexual Activity  . Alcohol use: Never  . Drug use: Never  . Sexual activity: Not on file  Other Topics Concern  . Not on file  Social History Narrative  . Not on file   Social  Determinants of Health   Financial Resource Strain: Not on file  Food Insecurity: Not on file  Transportation Needs: Not on file  Physical Activity: Not on file  Stress: Not on file  Social Connections: Not on file  Intimate Partner Violence: Not on file    FAMILY HISTORY: Family History  Problem Relation Age of Onset  . Diabetes Sister   . Diabetes Brother   . Diabetes Sister   . Breast cancer Other   . Hypertension Mother   . Glaucoma Mother   . Heart attack Father     ALLERGIES:  is allergic to shellfish allergy, levofloxacin, iodine, and sulfa antibiotics.  MEDICATIONS:  Current Outpatient Medications  Medication Sig Dispense Refill  . acetaminophen (TYLENOL) 500 MG tablet Take 500 mg by mouth every 6 (six) hours as needed for moderate pain.     Marland Kitchen amLODipine (NORVASC) 2.5 MG tablet Take by mouth.    Marland Kitchen anastrozole (ARIMIDEX) 1 MG tablet Take 1 tablet (1 mg total) by mouth daily. 90 tablet 1  . aspirin 81 MG EC tablet Take by mouth.    Marland Kitchen atorvastatin (LIPITOR) 10 MG tablet Take 10 mg by  mouth daily.    . benazepril (LOTENSIN) 40 MG tablet Take 40 mg by mouth daily.    . bisacodyl (DULCOLAX) 5 MG EC tablet Take 5 mg by mouth daily as needed for moderate constipation.    . brimonidine (ALPHAGAN) 0.2 % ophthalmic solution     . bumetanide (BUMEX) 1 MG tablet Take 1 mg by mouth daily.    . carvedilol (COREG) 25 MG tablet Take 1 tablet by mouth 2 (two) times daily with a meal.    . dorzolamide-timolol (COSOPT) 22.3-6.8 MG/ML ophthalmic solution     . DULoxetine (CYMBALTA) 30 MG capsule Take 30 mg by mouth daily.    . eszopiclone (LUNESTA) 1 MG TABS tablet Take 1 tablet (1 mg total) by mouth at bedtime as needed for sleep. Take immediately before bedtime 30 tablet 0  . ferrous sulfate 325 (65 FE) MG EC tablet TAKE 1 TABLET (325 MG TOTAL) BY MOUTH 2 (TWO) TIMES DAILY WITH A MEAL. 60 tablet 1  . hydrALAZINE (APRESOLINE) 100 MG tablet Take 100 mg by mouth 3 (three) times daily.   3   . latanoprost (XALATAN) 0.005 % ophthalmic solution     . loperamide (IMODIUM A-D) 2 MG tablet Take 1 tablet (2 mg total) by mouth 4 (four) times daily as needed for diarrhea or loose stools. 30 tablet 0  . mirtazapine (REMERON) 7.5 MG tablet Take 7.5 mg by mouth daily.    . Multiple Vitamin (MULTI-VITAMINS) TABS Take 1 tablet by mouth daily.     Marland Kitchen omeprazole (PRILOSEC) 40 MG capsule Take 40 mg by mouth daily.    . potassium chloride SA (KLOR-CON) 20 MEQ tablet Take 20 mEq by mouth daily.    Marland Kitchen triamcinolone cream (KENALOG) 0.5 % Apply 1 application topically 2 (two) times daily.    . vitamin B-12 (CYANOCOBALAMIN) 1000 MCG tablet Take 1 tablet (1,000 mcg total) by mouth daily. 90 tablet 0  . megestrol (MEGACE) 40 MG/ML suspension Take 20 mLs by mouth daily. (Patient not taking: Reported on 04/12/2020)    . predniSONE (DELTASONE) 10 MG tablet Take by mouth as directed. (Patient not taking: Reported on 04/12/2020)     No current facility-administered medications for this visit.     PHYSICAL EXAMINATION: ECOG PERFORMANCE STATUS: 1 - Symptomatic but completely ambulatory Vitals:   04/12/20 1022  BP: 124/63  Pulse: 76  Resp: 16  Temp: 98.8 F (37.1 C)  SpO2: 100%   Filed Weights   04/12/20 1022  Weight: 124 lb 11.2 oz (56.6 kg)    Physical Exam Constitutional:      Appearance: She is not ill-appearing.  HENT:     Head: Normocephalic and atraumatic.  Eyes:     General: No scleral icterus.    Pupils: Pupils are equal, round, and reactive to light.  Cardiovascular:     Rate and Rhythm: Normal rate and regular rhythm.     Heart sounds: Normal heart sounds.  Pulmonary:     Effort: Pulmonary effort is normal. No respiratory distress.     Breath sounds: No wheezing.  Abdominal:     General: Bowel sounds are normal. There is no distension.     Palpations: Abdomen is soft. There is no mass.     Tenderness: There is no abdominal tenderness.  Musculoskeletal:        General: No  deformity. Normal range of motion.     Cervical back: Normal range of motion and neck supple.     Comments: Lower  extremity edema  Skin:    General: Skin is warm and dry.  Neurological:     Mental Status: She is alert. Mental status is at baseline.  Psychiatric:     Comments: Flat affect     LABORATORY DATA:  I have reviewed the data as listed Lab Results  Component Value Date   WBC 5.0 04/10/2020   HGB 10.8 (L) 04/10/2020   HCT 33.9 (L) 04/10/2020   MCV 85.2 04/10/2020   PLT 271 04/10/2020   Recent Labs    05/17/19 1500 11/29/19 1155 12/27/19 1009 03/15/20 0855 04/10/20 1141  NA 132* 134* 134* 136 134*  K 3.5 3.8 3.9 4.0 3.8  CL 98 103 104 103 102  CO2 23 22 21* 23 23  GLUCOSE 143* 90 96 234* 220*  BUN 12 20 28* 31* 21  CREATININE 0.69 0.59 0.68 0.66 0.70  CALCIUM 9.4 9.3 9.4 9.9 9.2  GFRNONAA >60 >60 >60 >60 >60  GFRAA >60 >60 >60  --   --   PROT 7.5 8.0 8.2* 7.7 7.1  ALBUMIN 2.7* 3.2* 3.5 3.5 3.4*  AST $Re'28 15 22 17 17  'Sxm$ ALT $R'19 14 19 15 16  'qL$ ALKPHOS 70 54 42 48 42  BILITOT 0.9 0.5 0.5 0.6 0.7   Iron/TIBC/Ferritin/ %Sat    Component Value Date/Time   IRON 85 03/15/2020 0855   TIBC 295 03/15/2020 0855   FERRITIN 229 03/15/2020 0855   IRONPCTSAT 29 03/15/2020 0855     RADIOGRAPHIC STUDIES: I have personally reviewed the radiological images as listed and agreed with the findings in the report. MM DIAG BREAST TOMO BILATERAL  Result Date: 03/30/2020 CLINICAL DATA:  LEFT lumpectomy in 2019. History of RIGHT surgical excision. EXAM: DIGITAL DIAGNOSTIC BILATERAL MAMMOGRAM WITH TOMO AND CAD COMPARISON:  Previous exam(s). ACR Breast Density Category b: There are scattered areas of fibroglandular density. FINDINGS: Evaluation is limited secondary to patient inability to be optimally positioned. There is density and architectural distortion within the RIGHT breast, consistent with postsurgical changes. These are stable in comparison to prior. There is density and  architectural distortion within the LEFT breast, consistent with postsurgical changes. These are stable in comparison to prior. There are bilateral coarse dystrophic calcifications. Mammographic images were processed with CAD. IMPRESSION: No mammographic evidence of malignancy. RECOMMENDATION: Diagnostic mammogram is suggested in 1 year. (Code:DM-B-01Y) I have discussed the findings and recommendations with the patient. If applicable, a reminder letter will be sent to the patient regarding the next appointment. BI-RADS CATEGORY  2: Benign. Electronically Signed   By: Valentino Saxon MD   On: 03/30/2020 12:02       ASSESSMENT & PLAN:  1. History of breast cancer   2. Normocytic anemia   3. Osteopenia, unspecified location   Cancer Staging Malignant neoplasm of lower-inner quadrant of left breast in female, estrogen receptor positive ([place]) Staging form: Breast, AJCC 8th Edition - Clinical: No stage assigned - Unsigned - Pathologic stage from 06/11/2018: Stage IIA (pT2, pN0, cM0, G2, ER+, PR-, HER2-, Oncotype DX score: 27) - Signed by Earlie Server, MD on 06/11/2018  # Stage IIA pT2 pN0 ER positive invasive mammary carcinoma. Nov 2021 resumed on Arimidex. Tolerates well.  Recommend her to continue.  03/28/2020 Bilateral diagnostic mammogram showed no disease progression or recurrence .  # anemia, iron panel is stable.  Continue oral iron supplementation.  Hb is stable at 10.8.   # Osteopenia, continue calcium and vitamin D supplements.DEXA 11/11/2018 findings revealed osteopenia.  Recommend bisphosphate  treatment with Zometa Q6 months.  It was offered to her in January and she declined. #Generalized pain and fibromyalgia,  Follow up with primary care provider. Return of visit: Follow-up 4 months  Orders Placed This Encounter  Procedures  . CBC with Differential/Platelet    Standing Status:   Future    Standing Expiration Date:   04/12/2021  . Comprehensive metabolic panel    Standing  Status:   Future    Standing Expiration Date:   04/12/2021  . Ferritin    Standing Status:   Future    Standing Expiration Date:   04/12/2021  . Iron and TIBC    Standing Status:   Future    Standing Expiration Date:   04/12/2021   Earlie Server, MD, PhD  04/12/2020

## 2020-04-19 IMAGING — MR MR BILATERAL BREAST WITHOUT AND WITH CONTRAST
2 of 9 series · 6 of 48 positions shown · IV contrast (gadavist)
Comparison: Previous exam(s).

CLINICAL DATA: 77-year-old female with newly diagnosis LEFT breast
cancer.

LABS:  None performed today
EXAM:
BILATERAL BREAST MRI WITH AND WITHOUT CONTRAST
TECHNIQUE: Multiplanar, multisequence MR images of both breasts were obtained
prior to and following the intravenous administration of 7.5 ml of
Gadavist

[Series 2: T1 · axial · B · 1.5mm · 1.02mm/px · z∈[-80,+87]mm · 4 of 112 slices shown]
[im 1/112]
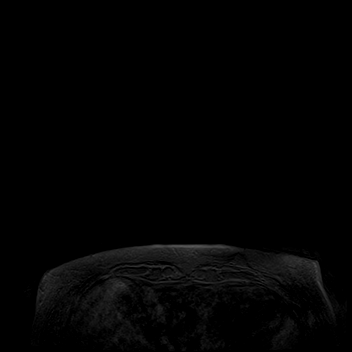
[im 38/112]
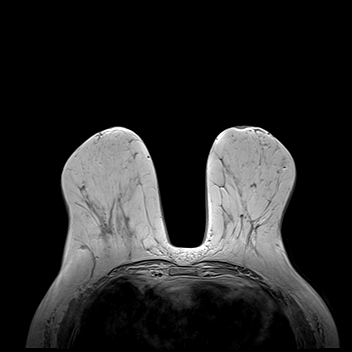
[im 75/112]
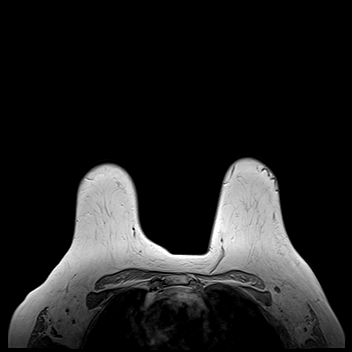
[im 112/112]
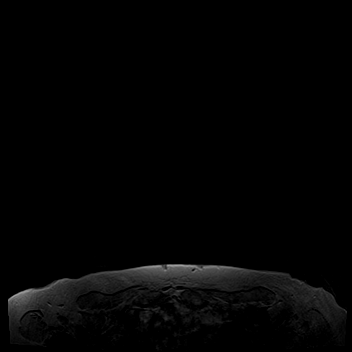

[Series 3: T2 · axial · B · 3.0mm · 1.02mm/px · z∈[-77,+85]mm · 2 of 45 slices shown]
[im 1/45]
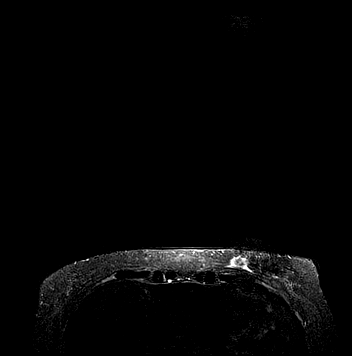
[im 45/45]
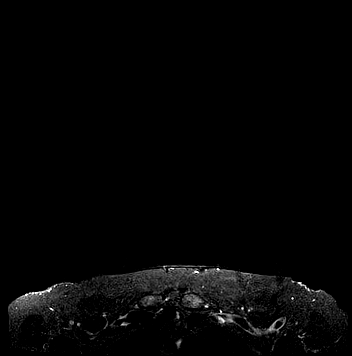

[6 of 48 positions shown; findings below may reference images not displayed]

Three-dimensional MR images were rendered by post-processing of the
original MR data on an independent workstation. The
three-dimensional MR images were interpreted, and findings are
reported in the following complete MRI report for this study. Three
dimensional images were evaluated at the independent [employer]
workstation
FINDINGS: Breast composition: b. Scattered fibroglandular tissue.

Background parenchymal enhancement: Minimal

Right breast: No suspicious mass or worrisome enhancement. An 8 mm
enhancing mass/lymph node in the posterior OUTER LEFT breast is
unchanged mammographically from 1451.

Left breast: A 2.1 x 2.7 x 2.5 cm irregular enhancing mass within
the posterior LOWER INNER LEFT breast is identified. This mass does
not abut the chest wall. No worrisome mass or suspicious enhancement
within the LEFT breast identified.

Lymph nodes: 3 LEFT axillary lymph node with borderline cortical
thickness noted, 1 which contains biopsy clip artifact, biopsy
proven to be a benign reactive lymph node.

No other abnormal appearing lymph nodes are identified.

Ancillary findings:  None.
IMPRESSION: 1. 2.7 cm biopsy-proven malignancy within the LOWER INNER LEFT
breast. No evidence of multifocal, multicentric or contralateral
malignancy.
2. LEFT axillary lymph nodes with borderline cortical thickness, 1
of which is biopsy proven to be a benign reactive lymph node.

RECOMMENDATION:
Treatment plan

BI-RADS CATEGORY  6: Known biopsy-proven malignancy.

## 2020-04-26 IMAGING — MG BREAST SURGICAL SPECIMEN
1 series · 1 of 1 positions shown · non-contrast
Comparison: Previous exam(s).

CLINICAL DATA: Specimen radiograph status post left breast
lumpectomy.

EXAM:
SPECIMEN RADIOGRAPH OF THE LEFT BREAST

[L SPECIMEN]
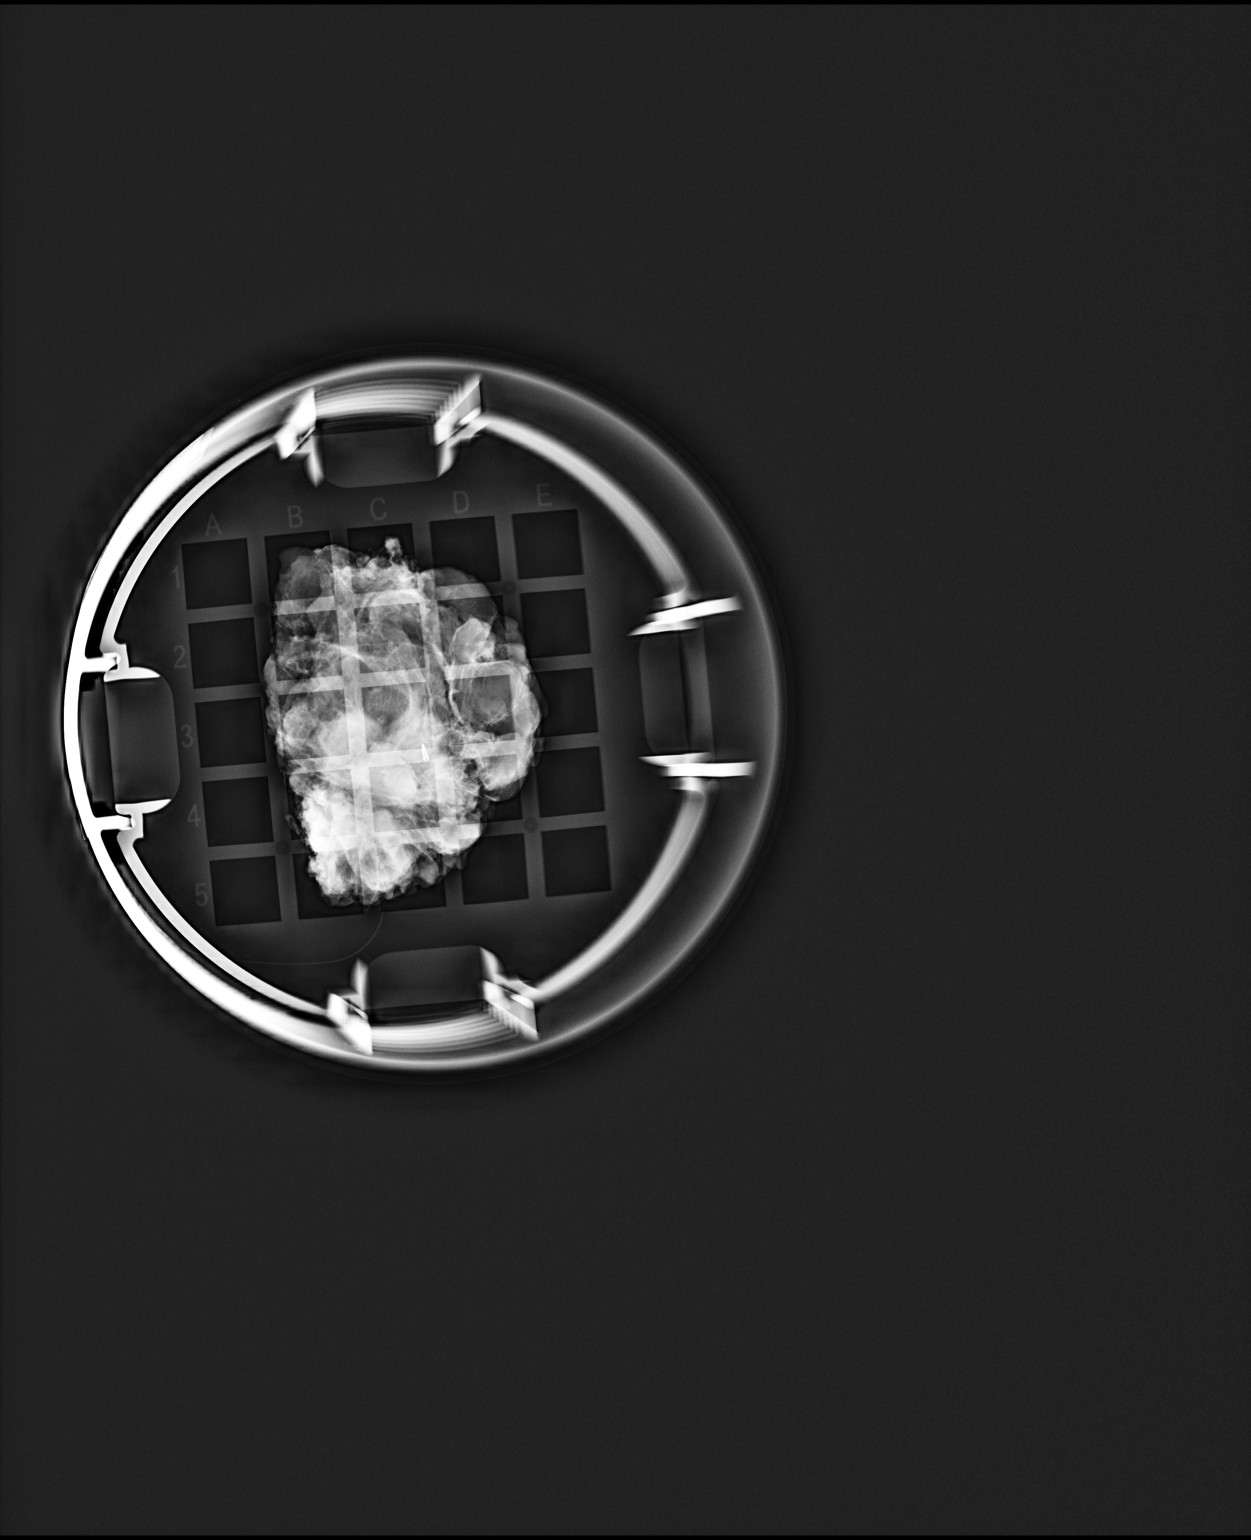

[1 of 1 positions shown; findings below may reference images not displayed]

FINDINGS: Status post excision of the left breast. The biopsy marking clip is
present and within the tissue specimen. This finding was
communicated with the OR at [DATE] a.m..
IMPRESSION: Specimen radiograph of the left breast.

## 2020-07-04 ENCOUNTER — Ambulatory Visit: Payer: Medicare Other | Admitting: Podiatry

## 2020-08-08 ENCOUNTER — Inpatient Hospital Stay: Payer: Medicare Other | Attending: Oncology

## 2020-08-08 DIAGNOSIS — D649 Anemia, unspecified: Secondary | ICD-10-CM | POA: Insufficient documentation

## 2020-08-08 DIAGNOSIS — Z170 Estrogen receptor positive status [ER+]: Secondary | ICD-10-CM | POA: Diagnosis not present

## 2020-08-08 DIAGNOSIS — Z853 Personal history of malignant neoplasm of breast: Secondary | ICD-10-CM

## 2020-08-08 DIAGNOSIS — C50312 Malignant neoplasm of lower-inner quadrant of left female breast: Secondary | ICD-10-CM | POA: Insufficient documentation

## 2020-08-08 DIAGNOSIS — M8580 Other specified disorders of bone density and structure, unspecified site: Secondary | ICD-10-CM | POA: Insufficient documentation

## 2020-08-08 DIAGNOSIS — Z79811 Long term (current) use of aromatase inhibitors: Secondary | ICD-10-CM | POA: Diagnosis not present

## 2020-08-08 LAB — IRON AND TIBC
Iron: 71 ug/dL (ref 28–170)
Saturation Ratios: 27 % (ref 10.4–31.8)
TIBC: 263 ug/dL (ref 250–450)
UIBC: 192 ug/dL

## 2020-08-08 LAB — CBC WITH DIFFERENTIAL/PLATELET
Abs Immature Granulocytes: 0.02 10*3/uL (ref 0.00–0.07)
Basophils Absolute: 0 10*3/uL (ref 0.0–0.1)
Basophils Relative: 1 %
Eosinophils Absolute: 0.1 10*3/uL (ref 0.0–0.5)
Eosinophils Relative: 2 %
HCT: 34 % — ABNORMAL LOW (ref 36.0–46.0)
Hemoglobin: 11 g/dL — ABNORMAL LOW (ref 12.0–15.0)
Immature Granulocytes: 0 %
Lymphocytes Relative: 19 %
Lymphs Abs: 0.9 10*3/uL (ref 0.7–4.0)
MCH: 26.4 pg (ref 26.0–34.0)
MCHC: 32.4 g/dL (ref 30.0–36.0)
MCV: 81.7 fL (ref 80.0–100.0)
Monocytes Absolute: 0.4 10*3/uL (ref 0.1–1.0)
Monocytes Relative: 8 %
Neutro Abs: 3.3 10*3/uL (ref 1.7–7.7)
Neutrophils Relative %: 70 %
Platelets: 254 10*3/uL (ref 150–400)
RBC: 4.16 MIL/uL (ref 3.87–5.11)
RDW: 14.4 % (ref 11.5–15.5)
WBC: 4.8 10*3/uL (ref 4.0–10.5)
nRBC: 0 % (ref 0.0–0.2)

## 2020-08-08 LAB — COMPREHENSIVE METABOLIC PANEL
ALT: 13 U/L (ref 0–44)
AST: 20 U/L (ref 15–41)
Albumin: 4.1 g/dL (ref 3.5–5.0)
Alkaline Phosphatase: 57 U/L (ref 38–126)
Anion gap: 11 (ref 5–15)
BUN: 23 mg/dL (ref 8–23)
CO2: 22 mmol/L (ref 22–32)
Calcium: 9.5 mg/dL (ref 8.9–10.3)
Chloride: 103 mmol/L (ref 98–111)
Creatinine, Ser: 0.68 mg/dL (ref 0.44–1.00)
GFR, Estimated: >60 mL/min (ref >60)
Glucose, Bld: 175 mg/dL — ABNORMAL HIGH (ref 70–99)
Potassium: 3.4 mmol/L — ABNORMAL LOW (ref 3.5–5.1)
Sodium: 136 mmol/L (ref 135–145)
Total Bilirubin: 0.6 mg/dL (ref 0.3–1.2)
Total Protein: 7.8 g/dL (ref 6.5–8.1)

## 2020-08-08 LAB — FERRITIN: Ferritin: 236 ng/mL (ref 11–307)

## 2020-08-10 ENCOUNTER — Other Ambulatory Visit: Payer: Self-pay

## 2020-08-10 ENCOUNTER — Inpatient Hospital Stay (HOSPITAL_BASED_OUTPATIENT_CLINIC_OR_DEPARTMENT_OTHER): Payer: Medicare Other | Admitting: Oncology

## 2020-08-10 ENCOUNTER — Encounter: Payer: Self-pay | Admitting: Oncology

## 2020-08-10 VITALS — BP 114/56 | HR 61 | Temp 96.5°F | Resp 16 | Ht 67.0 in | Wt 146.0 lb

## 2020-08-10 DIAGNOSIS — M8580 Other specified disorders of bone density and structure, unspecified site: Secondary | ICD-10-CM

## 2020-08-10 DIAGNOSIS — Z79811 Long term (current) use of aromatase inhibitors: Secondary | ICD-10-CM

## 2020-08-10 DIAGNOSIS — Z853 Personal history of malignant neoplasm of breast: Secondary | ICD-10-CM | POA: Diagnosis not present

## 2020-08-10 DIAGNOSIS — D649 Anemia, unspecified: Secondary | ICD-10-CM

## 2020-08-10 DIAGNOSIS — C50312 Malignant neoplasm of lower-inner quadrant of left female breast: Secondary | ICD-10-CM | POA: Diagnosis not present

## 2020-08-10 MED ORDER — ANASTROZOLE 1 MG PO TABS: 1.0000 mg | ORAL_TABLET | Freq: Every day | ORAL | 1 refills | Status: DC

## 2020-08-10 NOTE — Progress Notes (Signed)
Hematology/Oncology follow up note [place] [phone] [phone]   Patient Care Team: Dion Body, MD as PCP - General (Family Medicine) Earlie Server, MD as Consulting Physician (Hematology and Oncology)  REASON FOR VISIT:  Follow up for management  of Stage IIA breast cancer  HISTORY OF PRESENTING ILLNESS:  Jackie Allison is a  80 y.o.  female with PMH listed below who was referred to me to reestablish care for breast cancer and anemia evaluation.  #  04/13/2018 stage IIa breast cancer pT2 pN0 Grade 2, ER positive, PR negative HER-2 negative.No Lymphovascular invasion # 06/19/2018 wound infection. Surgical wound culture positive for MRSA, as well as enterococcus faecalis. Patient was discharged on 06/22/2018. Oncotype DX recurrence score came back at 27, predicting distant recurrence risk at 9 years with tamoxifen alone around 16%.  Group average absolute chemotherapy benefit >15%.  Patient declined chemotherapy.  # finished adjuvant radiation-last RT 09/27/2018 #June 2020 start adjuvant antiestrogen treatment with Arimidex 1 mg daily  #Osteopenic, received a dental clearance for Zometa.  Started on Zometa 4 mg every 6 months.  Received last dose on 11/16/19.  Patient was offered Zometa on 05/04/2019 and she declined. # Bone pain, 03/04/2019 bone scan whole body findings fever degenerated changes or related to previous surgery.  No worrisome findings for osseous metastatic disease  # Patient was last seen by me in January 2021.  At that time patient has anemia and has had extensive blood work-up which did not reveal etiology of the anemia.  I had recommended bone marrow biopsy  at that time and patient declined.  She also had diffuse bone pain, bone scan was negative and I recommend patient to temporarily hold off Arimidex.  After that visit she transferred her oncology care to [facility] and was seen by Dr. Rolley Sims.  Patient was recommended to  continue Arimidex which she has been doing since then.  Patient had admissions due to change of mental status, UTI, failure to thrive weight loss, decreased appetite. Patient was found to have progressively worsening of anemia, fatigue and was referred back to establish care with me for further evaluation.  Today patient was accompanied by her husband.  She states that she wants to transfer her care back to me.  11/30/2019 reestablish care with me and then did not come for follow-up Patient re patient to ports that she had a fall and fractured her left forearm fracture status post fixation.  # #Nov 2021 resumed on Arimidex inhibitor,  INTERVAL HISTORY Jackie Allison is a 80 y.o. female who has above history reviewed by me today presents to reestablish care for anemia, follow-up for breast cancer. Patient reports doing well She takes Arimidex overall tolerates well.   She reports feeling chest wall soreness with deep breath.  Ongoing problem, chronic.  Denies any shortness of breath.    Review of Systems  Constitutional: Positive for fatigue. Negative for appetite change, chills, fever and unexpected weight change.  HENT:   Negative for hearing loss and voice change.   Eyes: Negative for eye problems.  Respiratory: Negative for chest tightness and cough.   Cardiovascular: Negative for chest pain and leg swelling.  Gastrointestinal: Negative for abdominal distention, abdominal pain and blood in stool.  Endocrine: Negative for hot flashes.  Genitourinary: Negative for difficulty urinating and frequency.   Musculoskeletal: Positive for arthralgias.       Chronic back pain Generalized bone pain  Skin: Negative for itching and rash.  Neurological: Negative  for extremity weakness.  Hematological: Negative for adenopathy.  Psychiatric/Behavioral: Negative for confusion.    MEDICAL HISTORY:  Past Medical History:  Diagnosis Date  . Anemia    chronic microcytic anemia  . Breast abscess  06/20/2018  . Breast cancer ([place]) 03/2018   Invasive Mammary  . Cancer ([place]) 2019   left breast  . Diabetes mellitus without complication ([place])   . GERD (gastroesophageal reflux disease)   . Glaucoma   . Hypercholesterolemia   . Hypertension     SURGICAL HISTORY: Past Surgical History:  Procedure Laterality Date  . BACK SURGERY    . BREAST BIOPSY Left 02/2018  . BREAST EXCISIONAL BIOPSY Right ??  . BREAST LUMPECTOMY Left 04/03/2018   Invasive Mammary CA Neg margins  . CATARACT EXTRACTION W/ INTRAOCULAR LENS  IMPLANT, BILATERAL    . EYE SURGERY Bilateral    cataract extraction  . INCISION AND DRAINAGE ABSCESS Left 04/28/2018   Procedure: INCISION AND DRAINAGE ABSCESS;  Surgeon: Benjamine Sprague, DO;  Location: [facility] ORS;  Service: General;  Laterality: Left;  . INCISION AND DRAINAGE ABSCESS Left 06/20/2018   Procedure: INCISION AND DRAINAGE ABSCESS;  Surgeon: Herbert Pun, MD;  Location: [facility] ORS;  Service: General;  Laterality: Left;  . LUMBAR FUSION  2002?  . LUMBAR LAMINECTOMY  1997   titanium in back  . PARTIAL MASTECTOMY WITH AXILLARY SENTINEL LYMPH NODE BIOPSY Left 04/13/2018   Procedure: PARTIAL MASTECTOMY WITH AXILLARY SENTINEL LYMPH NODE BIOPSY  (No Needle Loc);  Surgeon: Benjamine Sprague, DO;  Location: [facility] ORS;  Service: General;  Laterality: Left;  . RETINAL DETACHMENT SURGERY Bilateral 2009  . TUBAL LIGATION      SOCIAL HISTORY: Social History   Socioeconomic History  . Marital status: Married    Spouse name: [name]  . Number of children: Not on file  . Years of education: Not on file  . Highest education level: Not on file  Occupational History  . Occupation: teacher/real estate  Tobacco Use  . Smoking status: Never Smoker  . Smokeless tobacco: Never Used  Vaping Use  . Vaping Use: Never used  Substance and Sexual Activity  . Alcohol use: Never  . Drug use: Never  . Sexual activity: Not on file  Other Topics Concern  . Not on file  Social History  Narrative  . Not on file   Social Determinants of Health   Financial Resource Strain: Not on file  Food Insecurity: Not on file  Transportation Needs: Not on file  Physical Activity: Not on file  Stress: Not on file  Social Connections: Not on file  Intimate Partner Violence: Not on file    FAMILY HISTORY: Family History  Problem Relation Age of Onset  . Diabetes Sister   . Diabetes Brother   . Diabetes Sister   . Breast cancer Other   . Hypertension Mother   . Glaucoma Mother   . Heart attack Father     ALLERGIES:  is allergic to shellfish allergy, levofloxacin, iodine, and sulfa antibiotics.  MEDICATIONS:  Current Outpatient Medications  Medication Sig Dispense Refill  . acetaminophen (TYLENOL) 500 MG tablet Take 500 mg by mouth every 6 (six) hours as needed for moderate pain.     Marland Kitchen amLODipine (NORVASC) 2.5 MG tablet Take by mouth.    Marland Kitchen anastrozole (ARIMIDEX) 1 MG tablet Take 1 tablet (1 mg total) by mouth daily. 90 tablet 1  . aspirin 81 MG EC tablet Take by mouth.    Marland Kitchen atorvastatin (LIPITOR)  10 MG tablet Take 10 mg by mouth daily.    . bisacodyl (DULCOLAX) 5 MG EC tablet Take 5 mg by mouth daily as needed for moderate constipation.    . brimonidine (ALPHAGAN) 0.2 % ophthalmic solution     . carvedilol (COREG) 25 MG tablet Take 1 tablet by mouth 2 (two) times daily with a meal.    . dorzolamide-timolol (COSOPT) 22.3-6.8 MG/ML ophthalmic solution     . DULoxetine (CYMBALTA) 30 MG capsule Take 30 mg by mouth daily.    . ferrous sulfate 325 (65 FE) MG EC tablet TAKE 1 TABLET (325 MG TOTAL) BY MOUTH 2 (TWO) TIMES DAILY WITH A MEAL. 60 tablet 1  . latanoprost (XALATAN) 0.005 % ophthalmic solution     . Multiple Vitamin (MULTI-VITAMINS) TABS Take 1 tablet by mouth daily.     Marland Kitchen omeprazole (PRILOSEC) 40 MG capsule Take 40 mg by mouth daily.    Marland Kitchen triamcinolone cream (KENALOG) 0.5 % Apply 1 application topically 2 (two) times daily.    . vitamin B-12 (CYANOCOBALAMIN) 1000 MCG  tablet Take 1 tablet (1,000 mcg total) by mouth daily. 90 tablet 0  . benazepril (LOTENSIN) 40 MG tablet Take 40 mg by mouth daily. (Patient not taking: Reported on 08/10/2020)    . bumetanide (BUMEX) 1 MG tablet Take 1 mg by mouth daily.    . predniSONE (DELTASONE) 10 MG tablet Take by mouth as directed. (Patient not taking: No sig reported)     No current facility-administered medications for this visit.     PHYSICAL EXAMINATION: ECOG PERFORMANCE STATUS: 1 - Symptomatic but completely ambulatory Vitals:   08/10/20 1006  BP: (!) 114/56  Pulse: 61  Resp: 16  Temp: (!) 96.5 F (35.8 C)  SpO2: 100%   Filed Weights   08/10/20 1006  Weight: 146 lb (66.2 kg)    Physical Exam Constitutional:      Appearance: She is not ill-appearing.  HENT:     Head: Normocephalic and atraumatic.  Eyes:     General: No scleral icterus.    Pupils: Pupils are equal, round, and reactive to light.  Cardiovascular:     Rate and Rhythm: Normal rate and regular rhythm.     Heart sounds: Normal heart sounds.  Pulmonary:     Effort: Pulmonary effort is normal. No respiratory distress.     Breath sounds: No wheezing.  Abdominal:     General: Bowel sounds are normal. There is no distension.     Palpations: Abdomen is soft. There is no mass.     Tenderness: There is no abdominal tenderness.  Musculoskeletal:        General: No deformity. Normal range of motion.     Cervical back: Normal range of motion and neck supple.     Comments: Lower extremity edema  Skin:    General: Skin is warm and dry.  Neurological:     Mental Status: She is alert. Mental status is at baseline.  Psychiatric:        Mood and Affect: Mood normal.     LABORATORY DATA:  I have reviewed the data as listed Lab Results  Component Value Date   WBC 4.8 08/08/2020   HGB 11.0 (L) 08/08/2020   HCT 34.0 (L) 08/08/2020   MCV 81.7 08/08/2020   PLT 254 08/08/2020   Recent Labs    11/29/19 1155 12/27/19 1009 03/15/20 0855  04/10/20 1141 08/08/20 1100  NA 134* 134* 136 134* 136  K 3.8 3.9  4.0 3.8 3.4*  CL 103 104 103 102 103  CO2 22 21* $Remo'23 23 22  'felvv$ GLUCOSE 90 96 234* 220* 175*  BUN 20 28* 31* 21 23  CREATININE 0.59 0.68 0.66 0.70 0.68  CALCIUM 9.3 9.4 9.9 9.2 9.5  GFRNONAA >60 >60 >60 >60 >60  GFRAA >60 >60  --   --   --   PROT 8.0 8.2* 7.7 7.1 7.8  ALBUMIN 3.2* 3.5 3.5 3.4* 4.1  AST $Re'15 22 17 17 20  'heO$ ALT $R'14 19 15 16 13  'eQ$ ALKPHOS 54 42 48 42 57  BILITOT 0.5 0.5 0.6 0.7 0.6   Iron/TIBC/Ferritin/ %Sat    Component Value Date/Time   IRON 71 08/08/2020 1100   TIBC 263 08/08/2020 1100   FERRITIN 236 08/08/2020 1100   IRONPCTSAT 27 08/08/2020 1100     RADIOGRAPHIC STUDIES: I have personally reviewed the radiological images as listed and agreed with the findings in the report. No results found.     ASSESSMENT & PLAN:  1. History of breast cancer   2. Normocytic anemia   3. Osteopenia, unspecified location   4. Aromatase inhibitor use   Cancer Staging Malignant neoplasm of lower-inner quadrant of left breast in female, estrogen receptor positive ([place]) Staging form: Breast, AJCC 8th Edition - Clinical: No stage assigned - Unsigned - Pathologic stage from 06/11/2018: Stage IIA (pT2, pN0, cM0, G2, ER+, PR-, HER2-, Oncotype DX score: 27) - Signed by Earlie Server, MD on 06/11/2018 Neoadjuvant therapy: No Multigene prognostic tests performed: Oncotype DX Recurrence score range: Greater than or equal to 11 Histologic grading system: 3 grade system Laterality: Left  # Stage IIA pT2 pN0 ER positive invasive mammary carcinoma. Nov 2021 resumed on Arimidex.  Clinically she is doing very well .  Recommend patient continue Arimidex. 03/28/2020 Bilateral diagnostic mammogram showed no disease progression or recurrence .  Continue annual mammogram for surveillance.  # anemia, iron panel is stable.  Hemoglobin has improved to 11.  Continue monitor.  # Osteopenia, continue calcium and vitamin D supplements.DEXA  11/11/2018 findings revealed osteopenia.  Recommend bisphosphate treatment with Zometa Q6 months.  The suggestion was discussed with patient during 3 different office encounters, patient declined.  #Generalized pain and fibromyalgia,  Follow up with primary care provider. Return of visit: Follow-up 6 months  Orders Placed This Encounter  Procedures  . DG Bone Density    Standing Status:   Future    Standing Expiration Date:   08/10/2021    Order Specific Question:   Reason for Exam (SYMPTOM  OR DIAGNOSIS REQUIRED)    Answer:   aromatase inhibitor use    Order Specific Question:   Preferred imaging location?    Answer:   [facility]  . CBC with Differential/Platelet    Standing Status:   Future    Standing Expiration Date:   08/10/2021  . Comprehensive metabolic panel    Standing Status:   Future    Standing Expiration Date:   08/10/2021  . Ferritin    Standing Status:   Future    Standing Expiration Date:   08/10/2021  . Iron and TIBC    Standing Status:   Future    Standing Expiration Date:   08/10/2021   Earlie Server, MD, PhD  08/10/2020

## 2020-10-30 ENCOUNTER — Other Ambulatory Visit: Payer: Self-pay

## 2020-10-30 ENCOUNTER — Ambulatory Visit
Admission: RE | Admit: 2020-10-30 | Discharge: 2020-10-30 | Disposition: A | Discharge status: Home / Self Care | Payer: Medicare Other | Source: Ambulatory Visit | Attending: Oncology | Admitting: Oncology

## 2020-10-30 DIAGNOSIS — Z13820 Encounter for screening for osteoporosis: Secondary | ICD-10-CM | POA: Diagnosis not present

## 2020-10-30 DIAGNOSIS — M8589 Other specified disorders of bone density and structure, multiple sites: Secondary | ICD-10-CM | POA: Insufficient documentation

## 2020-10-30 DIAGNOSIS — Z923 Personal history of irradiation: Secondary | ICD-10-CM | POA: Insufficient documentation

## 2020-10-30 DIAGNOSIS — E119 Type 2 diabetes mellitus without complications: Secondary | ICD-10-CM | POA: Diagnosis not present

## 2020-10-30 DIAGNOSIS — Z853 Personal history of malignant neoplasm of breast: Secondary | ICD-10-CM | POA: Insufficient documentation

## 2020-10-30 DIAGNOSIS — Z79811 Long term (current) use of aromatase inhibitors: Secondary | ICD-10-CM | POA: Diagnosis not present

## 2020-10-30 DIAGNOSIS — Z780 Asymptomatic menopausal state: Secondary | ICD-10-CM | POA: Diagnosis not present

## 2020-11-14 DIAGNOSIS — M720 Palmar fascial fibromatosis [Dupuytren]: Secondary | ICD-10-CM | POA: Insufficient documentation

## 2020-11-30 ENCOUNTER — Other Ambulatory Visit: Payer: Self-pay | Admitting: Oncology

## 2020-12-02 ENCOUNTER — Encounter: Payer: Self-pay | Admitting: Oncology

## 2021-01-31 DIAGNOSIS — M79641 Pain in right hand: Secondary | ICD-10-CM | POA: Insufficient documentation

## 2021-02-01 ENCOUNTER — Encounter: Payer: Self-pay | Admitting: Oncology

## 2021-02-02 DIAGNOSIS — M19049 Primary osteoarthritis, unspecified hand: Secondary | ICD-10-CM | POA: Insufficient documentation

## 2021-02-02 DIAGNOSIS — S66909A Unspecified injury of unspecified muscle, fascia and tendon at wrist and hand level, unspecified hand, initial encounter: Secondary | ICD-10-CM | POA: Insufficient documentation

## 2021-02-08 ENCOUNTER — Other Ambulatory Visit: Payer: Self-pay

## 2021-02-08 ENCOUNTER — Inpatient Hospital Stay: Payer: Medicare Other | Attending: Oncology

## 2021-02-08 DIAGNOSIS — Z803 Family history of malignant neoplasm of breast: Secondary | ICD-10-CM | POA: Diagnosis not present

## 2021-02-08 DIAGNOSIS — M8580 Other specified disorders of bone density and structure, unspecified site: Secondary | ICD-10-CM | POA: Diagnosis not present

## 2021-02-08 DIAGNOSIS — Z170 Estrogen receptor positive status [ER+]: Secondary | ICD-10-CM | POA: Insufficient documentation

## 2021-02-08 DIAGNOSIS — D509 Iron deficiency anemia, unspecified: Secondary | ICD-10-CM | POA: Insufficient documentation

## 2021-02-08 DIAGNOSIS — D649 Anemia, unspecified: Secondary | ICD-10-CM

## 2021-02-08 DIAGNOSIS — E119 Type 2 diabetes mellitus without complications: Secondary | ICD-10-CM | POA: Insufficient documentation

## 2021-02-08 DIAGNOSIS — M797 Fibromyalgia: Secondary | ICD-10-CM | POA: Insufficient documentation

## 2021-02-08 DIAGNOSIS — C50312 Malignant neoplasm of lower-inner quadrant of left female breast: Secondary | ICD-10-CM | POA: Diagnosis present

## 2021-02-08 DIAGNOSIS — I10 Essential (primary) hypertension: Secondary | ICD-10-CM | POA: Diagnosis not present

## 2021-02-08 DIAGNOSIS — Z853 Personal history of malignant neoplasm of breast: Secondary | ICD-10-CM

## 2021-02-08 LAB — CBC WITH DIFFERENTIAL/PLATELET
Abs Immature Granulocytes: 0.02 10*3/uL (ref 0.00–0.07)
Basophils Absolute: 0.1 10*3/uL (ref 0.0–0.1)
Basophils Relative: 1 %
Eosinophils Absolute: 0.2 10*3/uL (ref 0.0–0.5)
Eosinophils Relative: 3 %
HCT: 34.2 % — ABNORMAL LOW (ref 36.0–46.0)
Hemoglobin: 11.1 g/dL — ABNORMAL LOW (ref 12.0–15.0)
Immature Granulocytes: 0 %
Lymphocytes Relative: 19 %
Lymphs Abs: 1.1 10*3/uL (ref 0.7–4.0)
MCH: 26.1 pg (ref 26.0–34.0)
MCHC: 32.5 g/dL (ref 30.0–36.0)
MCV: 80.3 fL (ref 80.0–100.0)
Monocytes Absolute: 0.5 10*3/uL (ref 0.1–1.0)
Monocytes Relative: 9 %
Neutro Abs: 3.9 10*3/uL (ref 1.7–7.7)
Neutrophils Relative %: 68 %
Platelets: 280 10*3/uL (ref 150–400)
RBC: 4.26 MIL/uL (ref 3.87–5.11)
RDW: 13.7 % (ref 11.5–15.5)
WBC: 5.7 10*3/uL (ref 4.0–10.5)
nRBC: 0 % (ref 0.0–0.2)

## 2021-02-08 LAB — COMPREHENSIVE METABOLIC PANEL
ALT: 15 U/L (ref 0–44)
AST: 22 U/L (ref 15–41)
Albumin: 3.9 g/dL (ref 3.5–5.0)
Alkaline Phosphatase: 79 U/L (ref 38–126)
Anion gap: 8 (ref 5–15)
BUN: 20 mg/dL (ref 8–23)
CO2: 22 mmol/L (ref 22–32)
Calcium: 9.4 mg/dL (ref 8.9–10.3)
Chloride: 102 mmol/L (ref 98–111)
Creatinine, Ser: 0.92 mg/dL (ref 0.44–1.00)
GFR, Estimated: >60 mL/min (ref >60)
Glucose, Bld: 233 mg/dL — ABNORMAL HIGH (ref 70–99)
Potassium: 3.7 mmol/L (ref 3.5–5.1)
Sodium: 132 mmol/L — ABNORMAL LOW (ref 135–145)
Total Bilirubin: 0.4 mg/dL (ref 0.3–1.2)
Total Protein: 7.7 g/dL (ref 6.5–8.1)

## 2021-02-08 LAB — IRON AND TIBC
Iron: 71 ug/dL (ref 28–170)
Saturation Ratios: 25 % (ref 10.4–31.8)
TIBC: 288 ug/dL (ref 250–450)
UIBC: 217 ug/dL

## 2021-02-08 LAB — FERRITIN: Ferritin: 156 ng/mL (ref 11–307)

## 2021-02-11 ENCOUNTER — Encounter: Payer: Self-pay | Admitting: Oncology

## 2021-02-11 ENCOUNTER — Other Ambulatory Visit: Payer: Self-pay

## 2021-02-11 ENCOUNTER — Inpatient Hospital Stay (HOSPITAL_BASED_OUTPATIENT_CLINIC_OR_DEPARTMENT_OTHER): Payer: Medicare Other | Admitting: Oncology

## 2021-02-11 VITALS — BP 100/67 | HR 72 | Temp 97.7°F | Wt 146.7 lb

## 2021-02-11 DIAGNOSIS — C50312 Malignant neoplasm of lower-inner quadrant of left female breast: Secondary | ICD-10-CM | POA: Diagnosis not present

## 2021-02-11 DIAGNOSIS — Z1231 Encounter for screening mammogram for malignant neoplasm of breast: Secondary | ICD-10-CM | POA: Diagnosis not present

## 2021-02-11 DIAGNOSIS — D509 Iron deficiency anemia, unspecified: Secondary | ICD-10-CM | POA: Diagnosis not present

## 2021-02-11 DIAGNOSIS — Z853 Personal history of malignant neoplasm of breast: Secondary | ICD-10-CM

## 2021-02-11 DIAGNOSIS — Z79811 Long term (current) use of aromatase inhibitors: Secondary | ICD-10-CM | POA: Diagnosis not present

## 2021-02-11 NOTE — Progress Notes (Signed)
Hematology/Oncology follow up note [facility] Telephone:(336) [phone] Fax:(336) [phone]   Patient Care Team: Dion Body, MD as PCP - General (Family Medicine) Earlie Server, MD as Consulting Physician (Hematology and Oncology)  REASON FOR VISIT:  Follow up for management  of Stage IIA breast cancer  HISTORY OF PRESENTING ILLNESS:  Jackie Allison is a  80 y.o.  female with PMH listed below who was referred to me to reestablish care for breast cancer and anemia evaluation.  #  04/13/2018 stage IIa breast cancer pT2 pN0 Grade 2, ER positive, PR negative HER-2 negative.No Lymphovascular invasion # 06/19/2018 wound infection. Surgical wound culture positive for MRSA, as well as enterococcus faecalis. Patient was discharged on 06/22/2018. Oncotype DX recurrence score came back at 27, predicting distant recurrence risk at 9 years with tamoxifen alone around 16%.  Group average absolute chemotherapy benefit >15%.  Patient declined chemotherapy.  # finished adjuvant radiation-last RT 09/27/2018 #June 2020 start adjuvant antiestrogen treatment with Arimidex 1 mg daily  #Osteopenic, received a dental clearance for Zometa.  Started on Zometa 4 mg every 6 months.  Received last dose on 11/16/19.  Patient was offered Zometa on 05/04/2019 and she declined. # Bone pain, 03/04/2019 bone scan whole body findings fever degenerated changes or related to previous surgery.  No worrisome findings for osseous metastatic disease  # Patient was last seen by me in January 2021.  At that time patient has anemia and has had extensive blood work-up which did not reveal etiology of the anemia.  I had recommended bone marrow biopsy  at that time and patient declined.  She also had diffuse bone pain, bone scan was negative and I recommend patient to temporarily hold off Arimidex.  After that visit she transferred her oncology care to [facility] and was seen by Dr. Rolley Sims.  Patient was recommended to  continue Arimidex which she has been doing since then.  Patient had admissions due to change of mental status, UTI, failure to thrive weight loss, decreased appetite. Patient was found to have progressively worsening of anemia, fatigue and was referred back to establish care with me for further evaluation.  Today patient was accompanied by her husband.  She states that she wants to transfer her care back to me.  11/30/2019 reestablish care with me and then did not come for follow-up Patient re patient to ports that she had a fall and fractured her left forearm fracture status post fixation.  # #Nov 2021 resumed on Arimidex inhibitor,  INTERVAL HISTORY Jackie Allison is a 80 y.o. female who has above history reviewed by me today presents to reestablish care for anemia, follow-up for breast cancer. She was accompanied by her husband.  She is on Arimidex, tolerates well.  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     Review of Systems  Constitutional:  Positive for fatigue. Negative for appetite change, chills, fever and unexpected weight change.  HENT:   Negative for hearing loss and voice change.   Eyes:  Negative for eye problems.  Respiratory:  Negative for chest tightness and cough.   Cardiovascular:  Negative for chest pain and leg swelling.  Gastrointestinal:  Negative for abdominal distention, abdominal pain and blood in stool.  Endocrine: Negative for hot flashes.  Genitourinary:  Negative for difficulty urinating and frequency.   Musculoskeletal:  Positive for arthralgias.       Chronic back pain Generalized bone pain  Skin:  Negative for itching and rash.  Neurological:  Negative for extremity weakness.  Hematological:  Negative for adenopathy.  Psychiatric/Behavioral:  Negative for confusion.    MEDICAL HISTORY:  Past Medical History:  Diagnosis Date   Anemia    chronic microcytic anemia   Breast abscess 06/20/2018   Breast cancer ([place]) 03/2018   Invasive Mammary    Cancer ([place]) 2019   left breast   Diabetes mellitus without complication ([place])    GERD (gastroesophageal reflux disease)    Glaucoma    Hypercholesterolemia    Hypertension     SURGICAL HISTORY: Past Surgical History:  Procedure Laterality Date   BACK SURGERY     BREAST BIOPSY Left 02/2018   BREAST EXCISIONAL BIOPSY Right ??   BREAST LUMPECTOMY Left 04/03/2018   Invasive Mammary CA Neg margins   CATARACT EXTRACTION W/ INTRAOCULAR LENS  IMPLANT, BILATERAL     EYE SURGERY Bilateral    cataract extraction   INCISION AND DRAINAGE ABSCESS Left 04/28/2018   Procedure: INCISION AND DRAINAGE ABSCESS;  Surgeon: Benjamine Sprague, DO;  Location: [facility] ORS;  Service: General;  Laterality: Left;   INCISION AND DRAINAGE ABSCESS Left 06/20/2018   Procedure: INCISION AND DRAINAGE ABSCESS;  Surgeon: Herbert Pun, MD;  Location: [facility] ORS;  Service: General;  Laterality: Left;   LUMBAR FUSION  2002?   LUMBAR LAMINECTOMY  1997   titanium in back   PARTIAL MASTECTOMY WITH AXILLARY SENTINEL LYMPH NODE BIOPSY Left 04/13/2018   Procedure: PARTIAL MASTECTOMY WITH AXILLARY SENTINEL LYMPH NODE BIOPSY  (No Needle Loc);  Surgeon: Benjamine Sprague, DO;  Location: [facility] ORS;  Service: General;  Laterality: Left;   RETINAL DETACHMENT SURGERY Bilateral 2009   TUBAL LIGATION      SOCIAL HISTORY: Social History   Socioeconomic History   Marital status: Married    Spouse name: [name]   Number of children: Not on file   Years of education: Not on file   Highest education level: Not on file  Occupational History   Occupation: Restaurant manager, fast food estate  Tobacco Use   Smoking status: Never   Smokeless tobacco: Never  Vaping Use   Vaping Use: Never used  Substance and Sexual Activity   Alcohol use: Never   Drug use: Never   Sexual activity: Not on file  Other Topics Concern   Not on file  Social History Narrative   Not on file   Social Determinants of Health   Financial Resource Strain: Not on file   Food Insecurity: Not on file  Transportation Needs: Not on file  Physical Activity: Not on file  Stress: Not on file  Social Connections: Not on file  Intimate Partner Violence: Not on file    FAMILY HISTORY: Family History  Problem Relation Age of Onset   Diabetes Sister    Diabetes Brother    Diabetes Sister    Breast cancer Other    Hypertension Mother    Glaucoma Mother    Heart attack Father     ALLERGIES:  is allergic to shellfish allergy, levofloxacin, iodine, and sulfa antibiotics.  MEDICATIONS:  Current Outpatient Medications  Medication Sig Dispense Refill   acetaminophen (TYLENOL) 500 MG tablet Take 500 mg by mouth every 6 (six) hours as needed for moderate pain.      amLODipine (NORVASC) 2.5 MG tablet Take by mouth.     anastrozole (ARIMIDEX) 1 MG tablet Take 1 tablet (1 mg total) by mouth daily. 90 tablet 1   atorvastatin (LIPITOR) 10 MG tablet Take 10 mg by mouth daily.  bisacodyl (DULCOLAX) 5 MG EC tablet Take 5 mg by mouth daily as needed for moderate constipation.     brimonidine (ALPHAGAN) 0.2 % ophthalmic solution      carvedilol (COREG) 25 MG tablet Take 1 tablet by mouth 2 (two) times daily with a meal.     [employer] VITAMIN B12 1000 MCG tablet TAKE 1 TABLET BY MOUTH EVERY DAY 90 tablet 0   dorzolamide-timolol (COSOPT) 22.3-6.8 MG/ML ophthalmic solution      DULoxetine (CYMBALTA) 30 MG capsule Take 30 mg by mouth daily.     ferrous sulfate 325 (65 FE) MG EC tablet TAKE 1 TABLET (325 MG TOTAL) BY MOUTH 2 (TWO) TIMES DAILY WITH A MEAL. 60 tablet 1   latanoprost (XALATAN) 0.005 % ophthalmic solution      Multiple Vitamin (MULTI-VITAMINS) TABS Take 1 tablet by mouth daily.      omeprazole (PRILOSEC) 40 MG capsule Take 40 mg by mouth daily.     triamcinolone cream (KENALOG) 0.5 % Apply 1 application topically 2 (two) times daily.     benazepril (LOTENSIN) 40 MG tablet Take 40 mg by mouth daily. (Patient not taking: No sig reported)     bumetanide (BUMEX) 1 MG  tablet Take 1 mg by mouth daily.     predniSONE (DELTASONE) 10 MG tablet Take by mouth as directed. (Patient not taking: No sig reported)     No current facility-administered medications for this visit.     PHYSICAL EXAMINATION: ECOG PERFORMANCE STATUS: 1 - Symptomatic but completely ambulatory Vitals:   02/11/21 1034  BP: 100/67  Pulse: 72  Temp: 97.7 F (36.5 C)  SpO2: 98%   Filed Weights   02/11/21 1034  Weight: 146 lb 11.2 oz (66.5 kg)    Physical Exam Constitutional:      Appearance: She is not ill-appearing.  HENT:     Head: Normocephalic and atraumatic.  Eyes:     General: No scleral icterus.    Pupils: Pupils are equal, round, and reactive to light.  Cardiovascular:     Rate and Rhythm: Normal rate and regular rhythm.     Heart sounds: Normal heart sounds.  Pulmonary:     Effort: Pulmonary effort is normal. No respiratory distress.     Breath sounds: No wheezing.  Abdominal:     General: Bowel sounds are normal. There is no distension.     Palpations: Abdomen is soft. There is no mass.     Tenderness: There is no abdominal tenderness.  Musculoskeletal:        General: No deformity. Normal range of motion.     Cervical back: Normal range of motion and neck supple.     Comments: Lower extremity edema  Skin:    General: Skin is warm and dry.  Neurological:     Mental Status: She is alert. Mental status is at baseline.  Psychiatric:        Mood and Affect: Mood normal.    LABORATORY DATA:  I have reviewed the data as listed Lab Results  Component Value Date   WBC 5.7 02/08/2021   HGB 11.1 (L) 02/08/2021   HCT 34.2 (L) 02/08/2021   MCV 80.3 02/08/2021   PLT 280 02/08/2021   Recent Labs    04/10/20 1141 08/08/20 1100 02/08/21 1056  NA 134* 136 132*  K 3.8 3.4* 3.7  CL 102 103 102  CO2 _0 GLUCOSE 220* 175* 233*  BUN _1 CREATININE 0.70 0.68 0.92  CALCIUM 9.2 9.5 9.4  GFRNONAA >60 >60 >60  PROT 7.1 7.8 7.7  ALBUMIN 3.4* 4.1 3.9   AST _0 ALT _1 ALKPHOS 42 57 79  BILITOT 0.7 0.6 0.4    Iron/TIBC/Ferritin/ %Sat    Component Value Date/Time   IRON 71 02/08/2021 1056   TIBC 288 02/08/2021 1056   FERRITIN 156 02/08/2021 1056   IRONPCTSAT 25 02/08/2021 1056     RADIOGRAPHIC STUDIES: I have personally reviewed the radiological images as listed and agreed with the findings in the report. No results found.      ASSESSMENT & PLAN:  1. History of breast cancer   2. Screening mammogram, encounter for   3. Iron deficiency anemia, unspecified iron deficiency anemia type   4. Aromatase inhibitor use   Cancer Staging Malignant neoplasm of lower-inner quadrant of left breast in female, estrogen receptor positive ([place]) Staging form: Breast, AJCC 8th Edition - Clinical: No stage assigned - Unsigned - Pathologic stage from 06/11/2018: Stage IIA (pT2, pN0, cM0, G2, ER+, PR-, HER2-, Oncotype DX score: 27) - Signed by Earlie Server, MD on 06/11/2018 Neoadjuvant therapy: No Multigene prognostic tests performed: Oncotype DX Recurrence score range: Greater than or equal to 11 Histologic grading system: 3 grade system Laterality: Left  # Stage IIA pT2 pN0 ER positive invasive mammary carcinoma. Nov 2021 resumed on Arimidex.  Clinically she is doing very well Labs are reviewed and discussed with patient. 03/28/2020 Bilateral diagnostic mammogram showed no disease progression or recurrence .  Continue annual mammogram for surveillance. Obtain bilateral screening mammogram in Dec 2022.   #History of iron deficiency anemia, continue oral iron supplementation.  Hemoglobin has improved to 11.1  Refer to GI Dr.Toledo for work up  # Osteopenia, continue calcium and vitamin D supplements.DEXA 11/11/2018 findings revealed osteopenia.  Recommend bisphosphate treatment with Zometa Q6 months.  The suggestion was discussed with patient multiple visits, including today's visits, patient declined.  #Generalized pain and  fibromyalgia,  Follow up with primary care provider. Return of visit: Follow-up 6 months  Orders Placed This Encounter  Procedures   MM 3D SCREEN BREAST BILATERAL    Standing Status:   Future    Standing Expiration Date:   02/11/2022    Order Specific Question:   Reason for Exam (SYMPTOM  OR DIAGNOSIS REQUIRED)    Answer:   history of breast cancer    Order Specific Question:   Preferred imaging location?    Answer:   [facility]   CBC with Differential/Platelet    Standing Status:   Future    Standing Expiration Date:   02/11/2022   Comprehensive metabolic panel    Standing Status:   Future    Standing Expiration Date:   02/11/2022   Iron and TIBC    Standing Status:   Future    Standing Expiration Date:   02/11/2022   Ferritin    Standing Status:   Future    Standing Expiration Date:   02/11/2022   Ambulatory referral to Gastroenterology    Referral Priority:   Routine    Referral Type:   Consultation    Referral Reason:   Specialty Services Required    Referred to Provider:   Efrain Sella, MD    Number of Visits Requested:   1   Earlie Server, MD, PhD  02/11/2021

## 2021-03-01 ENCOUNTER — Other Ambulatory Visit: Payer: Self-pay | Admitting: Oncology

## 2021-03-07 ENCOUNTER — Other Ambulatory Visit: Payer: Self-pay | Admitting: Oncology

## 2021-03-07 NOTE — Telephone Encounter (Signed)
Notes Component Ref Range & Units 11 mo ago 1 yr ago 2 yr ago  Vitamin B-12 180 - 914 pg/mL 690  451 CM  481 CM

## 2021-04-01 ENCOUNTER — Ambulatory Visit
Admission: RE | Admit: 2021-04-01 | Discharge: 2021-04-01 | Disposition: A | Discharge status: Home / Self Care | Payer: Medicare Other | Source: Ambulatory Visit | Attending: Oncology | Admitting: Oncology

## 2021-04-01 ENCOUNTER — Other Ambulatory Visit: Payer: Self-pay

## 2021-04-01 DIAGNOSIS — Z1231 Encounter for screening mammogram for malignant neoplasm of breast: Secondary | ICD-10-CM | POA: Diagnosis present

## 2021-04-23 ENCOUNTER — Other Ambulatory Visit: Payer: Self-pay | Admitting: *Deleted

## 2021-04-23 NOTE — Telephone Encounter (Signed)
Too soon for refill, patient will call pharmacy for refill as she should have prescription on file there

## 2021-05-16 ENCOUNTER — Other Ambulatory Visit: Payer: Self-pay

## 2021-05-16 ENCOUNTER — Other Ambulatory Visit: Payer: Medicare Other

## 2021-05-16 DIAGNOSIS — Z515 Encounter for palliative care: Secondary | ICD-10-CM

## 2021-05-16 NOTE — Progress Notes (Signed)
PATIENT NAME: Jackie Allison DOB: 04/13/1941 MRN: [id number]  PRIMARY CARE PROVIDER: Dion Body, MD  RESPONSIBLE PARTY:  Acct ID - Guarantor Home Phone Work Phone Relationship Acct Type  [id number] [facility]* [phone]  Self P/F     [address] [place], [place], [place] [address]     Due to the COVID-19 crisis, this visit was done via telemedicine from my office and it was initiated and consent by this patient and or family.  I connected with  Jackie Allison OR PROXY on 05/16/21 by telephone and verified that I am speaking with the correct person using two identifiers.   I discussed the limitations of evaluation and management by telemedicine. The patient expressed understanding and agreed to proceed.    Spoke with patient via telephone to follow up on overall status.  Patient states she is doing well given her age.   She continues to use a walker for safe mobility.  No recent falls reported.  Patient is not actively participating in an exercise group at [facility].    She endorses pain to her hip and knee.  This has been a chronic problem for her.   She is taking tylenol as needed.  Patient also notes she has a stronger pain medication but is uncertain of the name.  She only takes this when pain is severe.   Appetite remains good and she reports maintaining her weight at 146 lbs.  Patient notes support of her daughter and spouse has been very helpful and believes this is why she is doing so well.  Home visit offered with our nurse practioner but patient feels this is not necessary at this time. She would like to continue with phone check ins every 2 months.  Update provided to [facility], NP.   HISTORY OF PRESENT ILLNESS:  [name] is a 81 y.o. year old female with multiple medical problems including left breast cancer DMT2, HTN, anemia, HLD, OA, fibromyalgia, depression, h/o Barrett's esophagus.  Patient is being followed by Palliative Care every 8-12 weeks and PRN.  CODE  STATUS: Full ADVANCED DIRECTIVES: Not on file MOST FORM: No PPS: 40%         Lorenza Burton, RN

## 2021-05-21 ENCOUNTER — Other Ambulatory Visit: Payer: Self-pay | Admitting: Otolaryngology

## 2021-05-21 DIAGNOSIS — H748X2 Other specified disorders of left middle ear and mastoid: Secondary | ICD-10-CM

## 2021-05-21 DIAGNOSIS — M2540 Effusion, unspecified joint: Secondary | ICD-10-CM

## 2021-05-21 DIAGNOSIS — H9191 Unspecified hearing loss, right ear: Secondary | ICD-10-CM

## 2021-06-12 ENCOUNTER — Other Ambulatory Visit: Payer: Self-pay | Admitting: Oncology

## 2021-07-03 DIAGNOSIS — M720 Palmar fascial fibromatosis [Dupuytren]: Secondary | ICD-10-CM | POA: Insufficient documentation

## 2021-07-24 ENCOUNTER — Ambulatory Visit
Admission: RE | Admit: 2021-07-24 | Discharge: 2021-07-24 | Disposition: A | Discharge status: Home / Self Care | Payer: Medicare Other | Source: Ambulatory Visit | Attending: Otolaryngology | Admitting: Otolaryngology

## 2021-07-24 ENCOUNTER — Encounter: Payer: Self-pay | Admitting: Oncology

## 2021-07-24 DIAGNOSIS — H9191 Unspecified hearing loss, right ear: Secondary | ICD-10-CM

## 2021-07-24 DIAGNOSIS — H748X2 Other specified disorders of left middle ear and mastoid: Secondary | ICD-10-CM

## 2021-07-24 DIAGNOSIS — M2540 Effusion, unspecified joint: Secondary | ICD-10-CM

## 2021-08-12 ENCOUNTER — Inpatient Hospital Stay: Payer: Medicare Other | Attending: Oncology

## 2021-08-12 DIAGNOSIS — M797 Fibromyalgia: Secondary | ICD-10-CM | POA: Diagnosis not present

## 2021-08-12 DIAGNOSIS — D509 Iron deficiency anemia, unspecified: Secondary | ICD-10-CM | POA: Diagnosis not present

## 2021-08-12 DIAGNOSIS — Z853 Personal history of malignant neoplasm of breast: Secondary | ICD-10-CM | POA: Insufficient documentation

## 2021-08-12 DIAGNOSIS — M8580 Other specified disorders of bone density and structure, unspecified site: Secondary | ICD-10-CM | POA: Diagnosis present

## 2021-08-12 LAB — CBC WITH DIFFERENTIAL/PLATELET
Abs Immature Granulocytes: 0.02 10*3/uL (ref 0.00–0.07)
Basophils Absolute: 0.1 10*3/uL (ref 0.0–0.1)
Basophils Relative: 1 %
Eosinophils Absolute: 0.2 10*3/uL (ref 0.0–0.5)
Eosinophils Relative: 3 %
HCT: 37.8 % (ref 36.0–46.0)
Hemoglobin: 12.3 g/dL (ref 12.0–15.0)
Immature Granulocytes: 0 %
Lymphocytes Relative: 24 %
Lymphs Abs: 1.5 10*3/uL (ref 0.7–4.0)
MCH: 26.5 pg (ref 26.0–34.0)
MCHC: 32.5 g/dL (ref 30.0–36.0)
MCV: 81.3 fL (ref 80.0–100.0)
Monocytes Absolute: 0.5 10*3/uL (ref 0.1–1.0)
Monocytes Relative: 8 %
Neutro Abs: 4.1 10*3/uL (ref 1.7–7.7)
Neutrophils Relative %: 64 %
Platelets: 302 10*3/uL (ref 150–400)
RBC: 4.65 MIL/uL (ref 3.87–5.11)
RDW: 14.1 % (ref 11.5–15.5)
WBC: 6.4 10*3/uL (ref 4.0–10.5)
nRBC: 0 % (ref 0.0–0.2)

## 2021-08-12 LAB — FERRITIN: Ferritin: 142 ng/mL (ref 11–307)

## 2021-08-12 LAB — COMPREHENSIVE METABOLIC PANEL
ALT: 17 U/L (ref 0–44)
AST: 20 U/L (ref 15–41)
Albumin: 4.1 g/dL (ref 3.5–5.0)
Alkaline Phosphatase: 76 U/L (ref 38–126)
Anion gap: 9 (ref 5–15)
BUN: 19 mg/dL (ref 8–23)
CO2: 23 mmol/L (ref 22–32)
Calcium: 9.7 mg/dL (ref 8.9–10.3)
Chloride: 103 mmol/L (ref 98–111)
Creatinine, Ser: 0.91 mg/dL (ref 0.44–1.00)
GFR, Estimated: >60 mL/min (ref >60)
Glucose, Bld: 186 mg/dL — ABNORMAL HIGH (ref 70–99)
Potassium: 4.3 mmol/L (ref 3.5–5.1)
Sodium: 135 mmol/L (ref 135–145)
Total Bilirubin: 0.6 mg/dL (ref 0.3–1.2)
Total Protein: 8 g/dL (ref 6.5–8.1)

## 2021-08-12 LAB — IRON AND TIBC
Iron: 97 ug/dL (ref 28–170)
Saturation Ratios: 32 % — ABNORMAL HIGH (ref 10.4–31.8)
TIBC: 308 ug/dL (ref 250–450)
UIBC: 211 ug/dL

## 2021-08-13 ENCOUNTER — Inpatient Hospital Stay (HOSPITAL_BASED_OUTPATIENT_CLINIC_OR_DEPARTMENT_OTHER): Payer: Medicare Other | Admitting: Oncology

## 2021-08-13 ENCOUNTER — Encounter: Payer: Self-pay | Admitting: Oncology

## 2021-08-13 VITALS — BP 111/68 | HR 71 | Temp 98.1°F | Resp 20 | Ht 66.0 in | Wt 148.6 lb

## 2021-08-13 DIAGNOSIS — J310 Chronic rhinitis: Secondary | ICD-10-CM | POA: Insufficient documentation

## 2021-08-13 DIAGNOSIS — M8580 Other specified disorders of bone density and structure, unspecified site: Secondary | ICD-10-CM

## 2021-08-13 DIAGNOSIS — Z853 Personal history of malignant neoplasm of breast: Secondary | ICD-10-CM

## 2021-08-13 DIAGNOSIS — D509 Iron deficiency anemia, unspecified: Secondary | ICD-10-CM

## 2021-08-13 DIAGNOSIS — M545 Low back pain, unspecified: Secondary | ICD-10-CM | POA: Insufficient documentation

## 2021-08-13 DIAGNOSIS — R252 Cramp and spasm: Secondary | ICD-10-CM | POA: Insufficient documentation

## 2021-08-13 DIAGNOSIS — Z79811 Long term (current) use of aromatase inhibitors: Secondary | ICD-10-CM | POA: Diagnosis not present

## 2021-08-13 DIAGNOSIS — M25559 Pain in unspecified hip: Secondary | ICD-10-CM | POA: Insufficient documentation

## 2021-08-13 MED ORDER — ANASTROZOLE 1 MG PO TABS: 1.0000 mg | ORAL_TABLET | Freq: Every day | ORAL | 1 refills | Status: DC

## 2021-08-13 NOTE — Progress Notes (Signed)
?Hematology/Oncology Progress note ?Telephone:(336) [phone] Fax:(336) [phone] ?  ? ? ? ?Patient Care Team: ?Marisue Ivan, MD as PCP - General (Family Medicine) ?Rickard Patience, MD as Consulting Physician (Hematology and Oncology) ? ?REASON FOR VISIT:  ?Follow up for management  of Stage IIA breast cancer ? ?HISTORY OF PRESENTING ILLNESS:  ?Jackie Allison is a  81 y.o.  female with PMH listed below who was referred to me to reestablish care for breast cancer and anemia evaluation. ? ?#  04/13/2018 stage IIa breast cancer pT2 pN0 ?Grade 2, ER positive, PR negative HER-2 negative.No Lymphovascular invasion ?# 06/19/2018 wound infection. Surgical wound culture positive for MRSA, as well as enterococcus faecalis. ?Patient was discharged on 06/22/2018. ?Oncotype DX recurrence score came back at 27, predicting distant recurrence risk at 9 years with tamoxifen alone around 16%.  Group average absolute chemotherapy benefit >15%.  ?Patient declined chemotherapy.  ?# finished adjuvant radiation-last RT 09/27/2018 ?#June 2020 start adjuvant antiestrogen treatment with Arimidex 1 mg daily ? ?#Osteopenic, received a dental clearance for Zometa.  Started on Zometa 4 mg every 6 months.  Received last dose on 11/16/19.  Patient was offered Zometa on 05/04/2019 and she declined. ?# Bone pain, 03/04/2019 bone scan whole body findings fever degenerated changes or related to previous surgery.  No worrisome findings for osseous metastatic disease ? ?# Patient was last seen by me in January 2021.  At that time patient has anemia and has had extensive blood work-up which did not reveal etiology of the anemia.  I had recommended bone marrow biopsy ? at that time and patient declined.  She also had diffuse bone pain, bone scan was negative and I recommend patient to temporarily hold off Arimidex.  After that visit she transferred her oncology care to [facility] and was seen by Dr. Harvie Junior.  Patient was recommended to continue Arimidex which she  has been doing since then. ? ?Patient had admissions due to change of mental status, UTI, failure to thrive weight loss, decreased appetite. ?Patient was found to have progressively worsening of anemia, fatigue and was referred back to establish care with me for further evaluation.  Today patient was accompanied by her husband.  She states that she wants to transfer her care back to me. ? ?11/30/2019 reestablish care with me and then did not come for follow-up ?Patient re patient to ports that she had a fall and fractured her left forearm fracture status post fixation. ? ?# #Nov 2021 resumed on Arimidex inhibitor,  ?INTERVAL HISTORY ?Jackie Allison is a 81 y.o. female who has above history reviewed by me today presents to reestablish care for anemia, follow-up for breast cancer. ?She was accompanied by her husband.  ?She had a fall 1 to 2 weeks ago.  Had some soreness on the left side. ?Patient reports being compliant on Arimidex.  Tolerates well. ? ? ? ?Review of Systems  ?Constitutional:  Positive for fatigue. Negative for appetite change, chills, fever and unexpected weight change.  ?HENT:   Negative for hearing loss and voice change.   ?Eyes:  Negative for eye problems.  ?Respiratory:  Negative for chest tightness and cough.   ?Cardiovascular:  Negative for chest pain and leg swelling.  ?Gastrointestinal:  Negative for abdominal distention, abdominal pain and blood in stool.  ?Endocrine: Negative for hot flashes.  ?Genitourinary:  Negative for difficulty urinating and frequency.   ?Musculoskeletal:  Positive for arthralgias.  ?     Chronic back pain ?Generalized bone pain  ?Skin:  Negative for itching and rash.  ?Neurological:  Negative for extremity weakness.  ?Hematological:  Negative for adenopathy.  ?Psychiatric/Behavioral:  Negative for confusion.   ? ?MEDICAL HISTORY:  ?Past Medical History:  ?Diagnosis Date  ? Anemia   ? chronic microcytic anemia  ? Breast abscess 06/20/2018  ? Breast cancer ([place]) 03/2018  ?  Invasive Mammary  ? Cancer [facility]) 2019  ? left breast  ? Diabetes mellitus without complication ([place])   ? GERD (gastroesophageal reflux disease)   ? Glaucoma   ? Hypercholesterolemia   ? Hypertension   ? ? ?SURGICAL HISTORY: ?Past Surgical History:  ?Procedure Laterality Date  ? BACK SURGERY    ? BREAST BIOPSY Left 02/2018  ? BREAST EXCISIONAL BIOPSY Right ??  ? BREAST LUMPECTOMY Left 04/03/2018  ? Invasive Mammary CA Neg margins  ? CATARACT EXTRACTION W/ INTRAOCULAR LENS  IMPLANT, BILATERAL    ? EYE SURGERY Bilateral   ? cataract extraction  ? INCISION AND DRAINAGE ABSCESS Left 04/28/2018  ? Procedure: INCISION AND DRAINAGE ABSCESS;  Surgeon: Benjamine Sprague, DO;  Location: [facility] ORS;  Service: General;  Laterality: Left;  ? INCISION AND DRAINAGE ABSCESS Left 06/20/2018  ? Procedure: INCISION AND DRAINAGE ABSCESS;  Surgeon: Herbert Pun, MD;  Location: [facility] ORS;  Service: General;  Laterality: Left;  ? LUMBAR FUSION  2002?  ? [place]  ? titanium in back  ? PARTIAL MASTECTOMY WITH AXILLARY SENTINEL LYMPH NODE BIOPSY Left 04/13/2018  ? Procedure: PARTIAL MASTECTOMY WITH AXILLARY SENTINEL LYMPH NODE BIOPSY  (No Needle Loc);  Surgeon: Benjamine Sprague, DO;  Location: [facility] ORS;  Service: General;  Laterality: Left;  ? RETINAL DETACHMENT SURGERY Bilateral 2009  ? TUBAL LIGATION    ? ? ?SOCIAL HISTORY: ?Social History  ? ?Socioeconomic History  ? Marital status: Married  ?  Spouse name: [name]  ? Number of children: Not on file  ? Years of education: Not on file  ? Highest education level: Not on file  ?Occupational History  ? Occupation: Restaurant manager, fast food estate  ?Tobacco Use  ? Smoking status: Never  ? Smokeless tobacco: Never  ?Vaping Use  ? Vaping Use: Never used  ?Substance and Sexual Activity  ? Alcohol use: Never  ? Drug use: Never  ? Sexual activity: Not on file  ?Other Topics Concern  ? Not on file  ?Social History Narrative  ? Not on file  ? ?Social Determinants of Health  ? ?Financial Resource Strain:  Not on file  ?Food Insecurity: Not on file  ?Transportation Needs: Not on file  ?Physical Activity: Not on file  ?Stress: Not on file  ?Social Connections: Not on file  ?Intimate Partner Violence: Not on file  ? ? ?FAMILY HISTORY: ?Family History  ?Problem Relation Age of Onset  ? Diabetes Sister   ? Diabetes Brother   ? Diabetes Sister   ? Breast cancer Other   ? Hypertension Mother   ? Glaucoma Mother   ? Heart attack Father   ? ? ?ALLERGIES:  is allergic to shellfish allergy, levofloxacin, iodine, and sulfa antibiotics. ? ?MEDICATIONS:  ?Current Outpatient Medications  ?Medication Sig Dispense Refill  ? acetaminophen (TYLENOL) 500 MG tablet Take 500 mg by mouth every 6 (six) hours as needed for moderate pain.     ? amLODipine (NORVASC) 2.5 MG tablet Take by mouth.    ? atorvastatin (LIPITOR) 10 MG tablet Take 10 mg by mouth daily.    ? benazepril (LOTENSIN) 40 MG tablet Take 40 mg by  mouth daily.    ? bisacodyl (DULCOLAX) 5 MG EC tablet Take 5 mg by mouth daily as needed for moderate constipation.    ? brimonidine (ALPHAGAN) 0.2 % ophthalmic solution     ? bumetanide (BUMEX) 1 MG tablet Take 1 mg by mouth daily.    ? carvedilol (COREG) 25 MG tablet Take 1 tablet by mouth 2 (two) times daily with a meal.    ? CVS VITAMIN B12 1000 MCG tablet TAKE 1 TABLET BY MOUTH EVERY DAY 90 tablet 0  ? dorzolamide-timolol (COSOPT) 22.3-6.8 MG/ML ophthalmic solution     ? DULoxetine (CYMBALTA) 30 MG capsule Take 30 mg by mouth daily.    ? latanoprost (XALATAN) 0.005 % ophthalmic solution     ? Multiple Vitamin (MULTI-VITAMINS) TABS Take 1 tablet by mouth daily.     ? omeprazole (PRILOSEC) 40 MG capsule Take 40 mg by mouth daily.    ? predniSONE (DELTASONE) 10 MG tablet Take by mouth as directed.    ? triamcinolone cream (KENALOG) 0.5 % Apply 1 application topically 2 (two) times daily.    ? anastrozole (ARIMIDEX) 1 MG tablet Take 1 tablet (1 mg total) by mouth daily. 90 tablet 1  ? ?No current facility-administered medications  for this visit.  ? ? ? ?PHYSICAL EXAMINATION: ?ECOG PERFORMANCE STATUS: 1 - Symptomatic but completely ambulatory ?Vitals:  ? 08/13/21 0950  ?BP: 111/68  ?Pulse: 71  ?Resp: 20  ?Temp: 98.1 ?F (36.7 ?C)  ?

## 2021-09-12 ENCOUNTER — Other Ambulatory Visit: Payer: Self-pay | Admitting: Oncology

## 2021-09-20 ENCOUNTER — Encounter: Payer: Self-pay | Admitting: Oncology

## 2021-12-20 ENCOUNTER — Other Ambulatory Visit: Payer: Self-pay | Admitting: Oncology

## 2022-01-16 DIAGNOSIS — R011 Cardiac murmur, unspecified: Secondary | ICD-10-CM | POA: Insufficient documentation

## 2022-02-12 ENCOUNTER — Other Ambulatory Visit: Payer: Medicare Other

## 2022-02-12 ENCOUNTER — Ambulatory Visit: Payer: Medicare Other | Admitting: Oncology

## 2022-02-24 ENCOUNTER — Encounter (INDEPENDENT_AMBULATORY_CARE_PROVIDER_SITE_OTHER): Payer: Self-pay

## 2022-03-06 ENCOUNTER — Other Ambulatory Visit: Payer: Self-pay | Admitting: Family Medicine

## 2022-03-06 DIAGNOSIS — Z1231 Encounter for screening mammogram for malignant neoplasm of breast: Secondary | ICD-10-CM

## 2022-03-17 ENCOUNTER — Inpatient Hospital Stay (HOSPITAL_BASED_OUTPATIENT_CLINIC_OR_DEPARTMENT_OTHER): Payer: Medicare Other | Admitting: Oncology

## 2022-03-17 ENCOUNTER — Encounter: Payer: Self-pay | Admitting: Oncology

## 2022-03-17 ENCOUNTER — Inpatient Hospital Stay: Payer: Medicare Other | Attending: Oncology

## 2022-03-17 VITALS — BP 122/57 | HR 71 | Temp 98.0°F | Wt 148.0 lb

## 2022-03-17 DIAGNOSIS — Z79811 Long term (current) use of aromatase inhibitors: Secondary | ICD-10-CM | POA: Insufficient documentation

## 2022-03-17 DIAGNOSIS — Z853 Personal history of malignant neoplasm of breast: Secondary | ICD-10-CM | POA: Diagnosis not present

## 2022-03-17 DIAGNOSIS — D509 Iron deficiency anemia, unspecified: Secondary | ICD-10-CM | POA: Diagnosis not present

## 2022-03-17 DIAGNOSIS — C50312 Malignant neoplasm of lower-inner quadrant of left female breast: Secondary | ICD-10-CM

## 2022-03-17 DIAGNOSIS — Z803 Family history of malignant neoplasm of breast: Secondary | ICD-10-CM | POA: Insufficient documentation

## 2022-03-17 DIAGNOSIS — M8580 Other specified disorders of bone density and structure, unspecified site: Secondary | ICD-10-CM

## 2022-03-17 DIAGNOSIS — Z170 Estrogen receptor positive status [ER+]: Secondary | ICD-10-CM | POA: Insufficient documentation

## 2022-03-17 DIAGNOSIS — E119 Type 2 diabetes mellitus without complications: Secondary | ICD-10-CM | POA: Diagnosis not present

## 2022-03-17 DIAGNOSIS — I10 Essential (primary) hypertension: Secondary | ICD-10-CM | POA: Diagnosis not present

## 2022-03-17 LAB — COMPREHENSIVE METABOLIC PANEL
ALT: 15 U/L (ref 0–44)
AST: 24 U/L (ref 15–41)
Albumin: 3.9 g/dL (ref 3.5–5.0)
Alkaline Phosphatase: 64 U/L (ref 38–126)
Anion gap: 8 (ref 5–15)
BUN: 15 mg/dL (ref 8–23)
CO2: 21 mmol/L — ABNORMAL LOW (ref 22–32)
Calcium: 9.3 mg/dL (ref 8.9–10.3)
Chloride: 106 mmol/L (ref 98–111)
Creatinine, Ser: 1.1 mg/dL — ABNORMAL HIGH (ref 0.44–1.00)
GFR, Estimated: 50 mL/min — ABNORMAL LOW (ref >60)
Glucose, Bld: 181 mg/dL — ABNORMAL HIGH (ref 70–99)
Potassium: 3.7 mmol/L (ref 3.5–5.1)
Sodium: 135 mmol/L (ref 135–145)
Total Bilirubin: 0.5 mg/dL (ref 0.3–1.2)
Total Protein: 7.6 g/dL (ref 6.5–8.1)

## 2022-03-17 LAB — CBC WITH DIFFERENTIAL/PLATELET
Abs Immature Granulocytes: 0.02 10*3/uL (ref 0.00–0.07)
Basophils Absolute: 0 10*3/uL (ref 0.0–0.1)
Basophils Relative: 0 %
Eosinophils Absolute: 0.1 10*3/uL (ref 0.0–0.5)
Eosinophils Relative: 2 %
HCT: 37.4 % (ref 36.0–46.0)
Hemoglobin: 11.9 g/dL — ABNORMAL LOW (ref 12.0–15.0)
Immature Granulocytes: 0 %
Lymphocytes Relative: 21 %
Lymphs Abs: 1.2 10*3/uL (ref 0.7–4.0)
MCH: 25.3 pg — ABNORMAL LOW (ref 26.0–34.0)
MCHC: 31.8 g/dL (ref 30.0–36.0)
MCV: 79.6 fL — ABNORMAL LOW (ref 80.0–100.0)
Monocytes Absolute: 0.4 10*3/uL (ref 0.1–1.0)
Monocytes Relative: 7 %
Neutro Abs: 4.1 10*3/uL (ref 1.7–7.7)
Neutrophils Relative %: 70 %
Platelets: 305 10*3/uL (ref 150–400)
RBC: 4.7 MIL/uL (ref 3.87–5.11)
RDW: 14.5 % (ref 11.5–15.5)
WBC: 5.9 10*3/uL (ref 4.0–10.5)
nRBC: 0 % (ref 0.0–0.2)

## 2022-03-17 MED ORDER — ANASTROZOLE 1 MG PO TABS: 1.0000 mg | ORAL_TABLET | Freq: Every day | ORAL | 1 refills | Status: DC

## 2022-03-17 NOTE — Progress Notes (Signed)
Patient is here for follow-up of breast cancer. Overall patient is doing well. She does state that she has tenderness under the surgery site. This has been present since having surgery.

## 2022-03-17 NOTE — Assessment & Plan Note (Signed)
continue calcium and vitamin D supplements.DEXA 11/11/2018 findings revealed osteopenia.  Patient declines bisphosphate.

## 2022-03-17 NOTE — Assessment & Plan Note (Signed)
Hemoglobin slightly lower.  Will check iron TIBC ferritin.

## 2022-03-17 NOTE — Assessment & Plan Note (Signed)
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 Nov 2021 resumed on Arimidex.  Clinically she is doing well Labs reviewed and discussed with patient Continue Arimidex.  Prescription refill sent to pharmacy 04/01/2021, bilateral screening mammogram showed no mammographic evidence of malignancy.   Continue annual mammogram screening.

## 2022-03-17 NOTE — Progress Notes (Signed)
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 Nov 2021 resumed on Arimidex.  Clinically she is doing well Labs reviewed and discussed with patient Continue Arimidex.  Prescription refill sent to pharmacy 04/01/2021, bilateral screening mammogram showed no mammographic evidence of malignancy.   Continue annual mammogram screening.  Osteopenia continue calcium and vitamin D supplements.DEXA 11/11/2018 findings revealed osteopenia.  Patient declines bisphosphate.  Iron deficiency anemia Hemoglobin slightly lower.  Will check iron TIBC ferritin.   Orders Placed This Encounter  Procedures   Ferritin    Standing Status:   Future    Standing Expiration Date:   03/18/2023   Iron and TIBC    Standing Status:   Future    Standing Expiration Date:   03/18/2023   CBC with Differential/Platelet    Standing Status:   Future    Standing Expiration Date:   03/18/2023   Comprehensive metabolic panel    Standing Status:   Future    Standing Expiration Date:   03/17/2023   Follow-up in 6 months. All questions were answered. The patient knows to call the clinic with any problems, questions or concerns.  Earlie Server, MD, PhD [facility] Hematology Oncology 03/17/2022   REASON FOR VISIT:  Follow up for management  of Stage IIA breast cancer  HISTORY OF PRESENTING ILLNESS:  Jackie Allison is a  81 y.o.  female with PMH listed below who was referred to me to reestablish care for breast cancer and anemia evaluation.  #  04/13/2018 stage IIa breast cancer pT2 pN0 Grade 2, ER positive, PR negative HER-2 negative.No Lymphovascular  invasion # 06/19/2018 wound infection. Surgical wound culture positive for MRSA, as well as enterococcus faecalis. Patient was discharged on 06/22/2018. Oncotype DX recurrence score came back at 27, predicting distant recurrence risk at 9 years with tamoxifen alone around 16%.  Group average absolute chemotherapy benefit >15%.  Patient declined chemotherapy.  # finished adjuvant radiation-last RT 09/27/2018 #June 2020 start adjuvant antiestrogen treatment with Arimidex 1 mg daily  #Osteopenic, received a dental clearance for Zometa.  Started on Zometa 4 mg every 6 months.  Received last dose on 11/16/19.  Patient was offered Zometa on 05/04/2019 and she declined. # Bone pain, 03/04/2019 bone scan whole body findings fever degenerated changes or related to previous surgery.  No worrisome findings for osseous metastatic disease  # Patient was last seen by me in January 2021.  At that time patient has anemia and has had extensive blood work-up which did not reveal etiology of the anemia.  I had recommended bone marrow biopsy  at that time and patient declined.  She also had diffuse bone pain, bone scan was negative and I recommend patient to temporarily hold off Arimidex.  After that visit she transferred her oncology care to [facility] and was seen by Dr. Rolley Sims.  Patient was recommended to continue Arimidex which she has been doing since then.  Patient had admissions due to change of mental status, UTI, failure to thrive weight loss, decreased appetite. Patient was found to have progressively worsening of anemia, fatigue and was referred back to establish care with me for further evaluation.  Today patient was  accompanied by her husband.  She states that she wants to transfer her care back to me.  11/30/2019 reestablish care with me and then did not come for follow-up Patient re patient to ports that she had a fall and fractured her left forearm fracture status post fixation.  # #Nov 2021 resumed on  Arimidex inhibitor,  INTERVAL HISTORY Jackie Allison is a 81 y.o. female who has above history reviewed by me today presents to reestablish care for anemia, follow-up for breast cancer. She was accompanied by her husband.  She reports feeling well.  Chronic joint pain.  Unchanged.  She continues to have intermittent breast pain at the site of previous surgery.  No new complaints.    Review of Systems  Constitutional:  Positive for fatigue. Negative for appetite change, chills, fever and unexpected weight change.  HENT:   Negative for hearing loss and voice change.   Eyes:  Negative for eye problems.  Respiratory:  Negative for chest tightness and cough.   Cardiovascular:  Negative for chest pain and leg swelling.  Gastrointestinal:  Negative for abdominal distention, abdominal pain and blood in stool.  Endocrine: Negative for hot flashes.  Genitourinary:  Negative for difficulty urinating and frequency.   Musculoskeletal:  Positive for arthralgias.       Chronic back pain Generalized bone pain  Skin:  Negative for itching and rash.  Neurological:  Negative for extremity weakness.  Hematological:  Negative for adenopathy.  Psychiatric/Behavioral:  Negative for confusion.     MEDICAL HISTORY:  Past Medical History:  Diagnosis Date   Anemia    chronic microcytic anemia   Breast abscess 06/20/2018   Breast cancer ([place]) 03/2018   Invasive Mammary   Cancer ([place]) 2019   left breast   Diabetes mellitus without complication ([place])    GERD (gastroesophageal reflux disease)    Glaucoma    Hypercholesterolemia    Hypertension     SURGICAL HISTORY: Past Surgical History:  Procedure Laterality Date   BACK SURGERY     BREAST BIOPSY Left 02/2018   BREAST EXCISIONAL BIOPSY Right ??   BREAST LUMPECTOMY Left 04/03/2018   Invasive Mammary CA Neg margins   CATARACT EXTRACTION W/ INTRAOCULAR LENS  IMPLANT, BILATERAL     EYE SURGERY Bilateral    cataract extraction   INCISION AND DRAINAGE  ABSCESS Left 04/28/2018   Procedure: INCISION AND DRAINAGE ABSCESS;  Surgeon: Benjamine Sprague, DO;  Location: [facility] ORS;  Service: General;  Laterality: Left;   INCISION AND DRAINAGE ABSCESS Left 06/20/2018   Procedure: INCISION AND DRAINAGE ABSCESS;  Surgeon: Herbert Pun, MD;  Location: [facility] ORS;  Service: General;  Laterality: Left;   LUMBAR FUSION  2002?   LUMBAR LAMINECTOMY  1997   titanium in back   PARTIAL MASTECTOMY WITH AXILLARY SENTINEL LYMPH NODE BIOPSY Left 04/13/2018   Procedure: PARTIAL MASTECTOMY WITH AXILLARY SENTINEL LYMPH NODE BIOPSY  (No Needle Loc);  Surgeon: Benjamine Sprague, DO;  Location: [facility] ORS;  Service: General;  Laterality: Left;   RETINAL DETACHMENT SURGERY Bilateral 2009   TUBAL LIGATION      SOCIAL HISTORY: Social History   Socioeconomic History   Marital status: Married    Spouse name: [name]   Number of children: Not on file   Years of education: Not on file   Highest education level: Not on file  Occupational History   Occupation: Restaurant manager, fast food estate  Tobacco Use   Smoking status: Never   Smokeless tobacco: Never  Vaping Use  Vaping Use: Never used  Substance and Sexual Activity   Alcohol use: Never   Drug use: Never   Sexual activity: Not on file  Other Topics Concern   Not on file  Social History Narrative   Not on file   Social Determinants of Health   Financial Resource Strain: Not on file  Food Insecurity: Not on file  Transportation Needs: Not on file  Physical Activity: Not on file  Stress: Not on file  Social Connections: Not on file  Intimate Partner Violence: Not on file    FAMILY HISTORY: Family History  Problem Relation Age of Onset   Diabetes Sister    Diabetes Brother    Diabetes Sister    Breast cancer Other    Hypertension Mother    Glaucoma Mother    Heart attack Father     ALLERGIES:  is allergic to shellfish allergy, levofloxacin, iodine, and sulfa antibiotics.  MEDICATIONS:  Current Outpatient  Medications  Medication Sig Dispense Refill   acetaminophen (TYLENOL) 500 MG tablet Take 500 mg by mouth every 6 (six) hours as needed for moderate pain.      amLODipine (NORVASC) 2.5 MG tablet Take by mouth.     atorvastatin (LIPITOR) 10 MG tablet Take 10 mg by mouth daily.     benazepril (LOTENSIN) 40 MG tablet Take 40 mg by mouth daily.     bisacodyl (DULCOLAX) 5 MG EC tablet Take 5 mg by mouth daily as needed for moderate constipation.     brimonidine (ALPHAGAN) 0.2 % ophthalmic solution      bumetanide (BUMEX) 1 MG tablet Take 1 mg by mouth daily.     carvedilol (COREG) 25 MG tablet Take 1 tablet by mouth 2 (two) times daily with a meal.     [employer] VITAMIN B12 1000 MCG tablet TAKE 1 TABLET BY MOUTH EVERY DAY 90 tablet 0   dorzolamide-timolol (COSOPT) 22.3-6.8 MG/ML ophthalmic solution      DULoxetine (CYMBALTA) 30 MG capsule Take 30 mg by mouth daily.     latanoprost (XALATAN) 0.005 % ophthalmic solution      Multiple Vitamin (MULTI-VITAMINS) TABS Take 1 tablet by mouth daily.      omeprazole (PRILOSEC) 40 MG capsule Take 40 mg by mouth daily.     triamcinolone cream (KENALOG) 0.5 % Apply 1 application topically 2 (two) times daily.     anastrozole (ARIMIDEX) 1 MG tablet Take 1 tablet (1 mg total) by mouth daily. 90 tablet 1   predniSONE (DELTASONE) 10 MG tablet Take by mouth as directed. (Patient not taking: Reported on 03/17/2022)     No current facility-administered medications for this visit.     PHYSICAL EXAMINATION: ECOG PERFORMANCE STATUS: 1 - Symptomatic but completely ambulatory Vitals:   03/17/22 1345  BP: (!) 122/57  Pulse: 71  Temp: 98 F (36.7 C)  SpO2: 100%   Filed Weights   03/17/22 1345  Weight: 148 lb (67.1 kg)    Physical Exam Constitutional:      Appearance: She is not ill-appearing.  HENT:     Head: Normocephalic and atraumatic.  Eyes:     General: No scleral icterus.    Pupils: Pupils are equal, round, and reactive to light.  Cardiovascular:      Rate and Rhythm: Normal rate and regular rhythm.     Heart sounds: Normal heart sounds.  Pulmonary:     Effort: Pulmonary effort is normal. No respiratory distress.     Breath sounds: No wheezing.  Abdominal:     General: Bowel sounds are normal. There is no distension.     Palpations: Abdomen is soft. There is no mass.     Tenderness: There is no abdominal tenderness.  Musculoskeletal:        General: No deformity. Normal range of motion.     Cervical back: Normal range of motion and neck supple.     Comments: Lower extremity edema  Skin:    General: Skin is warm and dry.  Neurological:     Mental Status: She is alert. Mental status is at baseline.  Psychiatric:        Mood and Affect: Mood normal.   Breast exam was performed in seated position. Patient is status post left lumpectomy with a well-healed surgical scar.  No palpable discrete mass in bilateral breast.  No palpable axillary lymphadenopathy bilaterally 8 LABORATORY DATA:  I have reviewed the data as listed Lab Results  Component Value Date   WBC 5.9 03/17/2022   HGB 11.9 (L) 03/17/2022   HCT 37.4 03/17/2022   MCV 79.6 (L) 03/17/2022   PLT 305 03/17/2022   Recent Labs    08/12/21 1005 03/17/22 1313  NA 135 135  K 4.3 3.7  CL 103 106  CO2 23 21*  GLUCOSE 186* 181*  BUN 19 15  CREATININE 0.91 1.10*  CALCIUM 9.7 9.3  GFRNONAA >60 50*  PROT 8.0 7.6  ALBUMIN 4.1 3.9  AST 20 24  ALT 17 15  ALKPHOS 76 64  BILITOT 0.6 0.5    Iron/TIBC/Ferritin/ %Sat    Component Value Date/Time   IRON 97 08/12/2021 1005   TIBC 308 08/12/2021 1005   FERRITIN 142 08/12/2021 1005   IRONPCTSAT 32 (H) 08/12/2021 1005     RADIOGRAPHIC STUDIES: I have personally reviewed the radiological images as listed and agreed with the findings in the report. No results found.

## 2022-03-18 ENCOUNTER — Other Ambulatory Visit: Payer: Self-pay

## 2022-03-18 ENCOUNTER — Telehealth: Payer: Self-pay

## 2022-03-18 DIAGNOSIS — D509 Iron deficiency anemia, unspecified: Secondary | ICD-10-CM

## 2022-03-18 DIAGNOSIS — Z853 Personal history of malignant neoplasm of breast: Secondary | ICD-10-CM

## 2022-03-18 LAB — IRON AND TIBC
Iron: 89 ug/dL (ref 28–170)
Saturation Ratios: 32 % — ABNORMAL HIGH (ref 10.4–31.8)
TIBC: 274 ug/dL (ref 250–450)
UIBC: 185 ug/dL

## 2022-03-18 LAB — FERRITIN: Ferritin: 88 ng/mL (ref 11–307)

## 2022-03-18 NOTE — Telephone Encounter (Signed)
Sent message to lab asking to add on TIBC and ferritin to patient's labs from 11/20. Lab confirmed that they will add on these test.

## 2022-03-18 NOTE — Telephone Encounter (Signed)
-----   Message from Earlie Server, MD sent at 03/17/2022 10:13 PM EST ----- Looks like iron tibc ferritin levels were not added. Please ask Lab to add thanks.

## 2022-03-20 ENCOUNTER — Other Ambulatory Visit: Payer: Self-pay | Admitting: Oncology

## 2022-04-10 ENCOUNTER — Ambulatory Visit
Admission: RE | Admit: 2022-04-10 | Discharge: 2022-04-10 | Disposition: A | Discharge status: Home / Self Care | Payer: Medicare Other | Source: Ambulatory Visit | Attending: Family Medicine | Admitting: Family Medicine

## 2022-04-10 DIAGNOSIS — Z1231 Encounter for screening mammogram for malignant neoplasm of breast: Secondary | ICD-10-CM | POA: Insufficient documentation

## 2022-04-11 ENCOUNTER — Telehealth: Payer: Self-pay | Admitting: Hospice

## 2022-04-11 DIAGNOSIS — Z515 Encounter for palliative care: Secondary | ICD-10-CM

## 2022-04-11 NOTE — Telephone Encounter (Signed)
NP called to check in on patient; called not picked.  No set up to leave voicemail.

## 2022-05-03 ENCOUNTER — Other Ambulatory Visit: Payer: Self-pay | Admitting: Oncology

## 2022-06-05 ENCOUNTER — Telehealth: Payer: Self-pay | Admitting: Hospice

## 2022-06-05 DIAGNOSIS — Z515 Encounter for palliative care: Secondary | ICD-10-CM

## 2022-06-05 NOTE — Telephone Encounter (Signed)
NP called patient for a visit since NP was in her [employer].  Patient said she was overall doing well and did not need a visit because she was busy with other commitments.  NP gave her contact and asked her to call with any concerns.

## 2022-06-06 ENCOUNTER — Other Ambulatory Visit: Payer: Medicare Other | Admitting: Hospice

## 2022-06-06 DIAGNOSIS — R269 Unspecified abnormalities of gait and mobility: Secondary | ICD-10-CM

## 2022-06-06 DIAGNOSIS — Z170 Estrogen receptor positive status [ER+]: Secondary | ICD-10-CM

## 2022-06-06 DIAGNOSIS — Z515 Encounter for palliative care: Secondary | ICD-10-CM

## 2022-06-06 DIAGNOSIS — C50312 Malignant neoplasm of lower-inner quadrant of left female breast: Secondary | ICD-10-CM

## 2022-06-06 DIAGNOSIS — G8929 Other chronic pain: Secondary | ICD-10-CM

## 2022-06-06 DIAGNOSIS — I10 Essential (primary) hypertension: Secondary | ICD-10-CM

## 2022-06-06 NOTE — Progress Notes (Signed)
[facility] Palliative Care Consult Note Telephone: [phone]  Fax: [phone]  PATIENT NAME: Jackie Allison [place] [address] [phone] (home)  DOB: [birth year] MRN: [id number]  PRIMARY CARE PROVIDER:    Dion Body, MD,  [facility] [place] [address] [phone]  REFERRING PROVIDER:   Dion Body, MD [facility] [place],  [place] [address] [phone]  RESPONSIBLE PARTY:   Self [id number] Contact Information     Name Relation Home Work [place] Spouse [phone]  [phone]       TELEHEALTH VISIT STATEMENT Due to the COVID-19 crisis, this visit was done via telemedicine from my office and it was initiated and consent by this patient and or family.  I connected with patient OR PROXY by a telephone/video  and verified that I am speaking with the correct person. I discussed the limitations of evaluation and management by telemedicine. The patient expressed understanding and agreed to proceed. Palliative Care was asked to follow this patient to address advance care planning, complex medical decision making and goals of care clarification. This is the initial visit.     ASSESSMENT AND / RECOMMENDATIONS:   Advance Care Planning: Our advance care planning conversation included a discussion about:    The value and importance of advance care planning  Difference between Hospice and Palliative care Exploration of goals of care in the event of a sudden injury or illness  Identification and preparation of a healthcare agent  Review and updating or creation of an  advance directive document . Decision not to resuscitate or to de-escalate disease focused treatments due to poor prognosis.  CODE STATUS: Discussion on the ramifications and implications of CODE STATUS.  Patient affirmed she is a full code.  Goals of Care: Goals  include to maximize quality of life and symptom management  I spent  16 minutes providing this initial consultation. More than 50% of the time in this consultation was spent on counseling patient and coordinating communication. --------------------------------------------------------------------------------------------------------------------------------------  Symptom Management/Plan: Symptom Management/Plan: Breast cancer: Completed radiation.  Currently on anastrozole.  Continue surveillance with oncology as planned. HTN: Managed with carvedilol and amlodipine. Low back pain: Related to several back surgeries.  Reports taking OTC Tylenol 500 mg 3 times daily as needed pain and lidocaine patch, with relief. Gait disturbance: Completed physical therapy.  Education provided on the need to use rolling walker to provide support and help prevent fall. Routine CBC CMP.  Follow up: Palliative care will continue to follow for complex medical decision making, advance care planning, and clarification of goals. Return 6 weeks or prn. Encouraged to call provider sooner with any concerns.   Family /Caregiver/Community Supports: Patient lives at home with house  HOSPICE ELIGIBILITY/DIAGNOSIS: TBD  Chief Complaint: Initial Palliative care visit  HISTORY OF PRESENT ILLNESS:  Jackie Allison is a 82 y.o. year old female  with multiple morbidities requiring close monitoring and with high risk of complications and  mortality: Malignant neoplasm of left inner quadrant of left breast, hypertension, hyperlipidemia, low back pain.  Patient reports doing well overall, reports low back pain is well-managed with current pain regiment.  She denies pain/discomfort, in no acute distress. History obtained from review of EMR, discussion with primary team, caregiver, family and/or Jackie Allison.  Review and summarization of Epic records shows history from other than patient. Rest of 10 point ROS asked and negative.   Independent interpretation of tests and  reviewed as needed, available labs, patient records, imaging, studies and related documents from the EMR.   PAST MEDICAL HISTORY:  Active Ambulatory Problems    Diagnosis Date Noted   Malignant hypertension 02/11/2018   Hyperlipidemia 02/11/2018   Diabetes ([place]) 02/11/2018   Lymphedema 02/14/2018   Malignant neoplasm of lower-inner quadrant of left breast in female, estrogen receptor positive ([place]) 04/28/2018   Osteopenia 11/01/2018   Pain due to onychomycosis of toenails of both feet 02/17/2019   Aphakia of right eye 01/24/2016   Chronic hyponatremia 01/11/2018   Encounter for general adult medical examination without abnormal findings 11/04/2016   Essential hypertension 11/02/2015   Gastroesophageal reflux disease without esophagitis 05/12/2019   History of Barrett's esophagus 05/12/2019   History of microcytic hypochromic anemia 05/12/2019   Primary open angle glaucoma of both eyes, indeterminate stage 01/24/2016   Pseudophakia, left eye 01/24/2016   Pure hypercholesterolemia 11/02/2015   Iron deficiency anemia 11/30/2019   History of breast cancer 11/30/2019   Failure to thrive (0-17) 11/30/2019   Normocytic anemia 03/15/2020   Goals of care, counseling/discussion 03/31/2020   Barrett's esophagus 10/24/2019   Current severe episode of major depressive disorder without psychotic features without prior episode ([place]) 05/27/2019   Decreased appetite 08/08/2019   Depression with somatization 05/28/2019   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 10/24/2019   Enthesopathy of hip region 10/24/2019   Ganglion of tendon sheath 10/24/2019   Lens replaced by other means 10/16/2006   Lesion of plantar nerve 10/24/2019   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   Onychomycosis due to dermatophyte 10/24/2019   Osteoarthritis 10/24/2019   Other states following surgery of eye and adnexa 10/24/2019   Pain in joint involving lower leg 10/24/2019   Postlaminectomy  syndrome, lumbar region 10/24/2019   Preglaucoma 02/07/2009   Retinal detachment with retinal defect 10/24/2019   Situational anxiety 06/01/2019   Spinal stenosis of lumbar region without neurogenic claudication 10/24/2019   Vitamin D deficiency 10/24/2019   Arthritis of hand 02/02/2021   Dupuytren's contracture of right hand 11/14/2020   Dupuytren's disease of palm 07/03/2021   Injury of extensor tendon of hand 02/02/2021   Lower back pain 08/13/2021   Pain in joint, pelvic region and thigh 08/13/2021   Pain in right hand 01/31/2021   Rhinitis 08/13/2021   Cramp of limb 08/13/2021   Aromatase inhibitor use 03/17/2022   Resolved Ambulatory Problems    Diagnosis Date Noted   Breast abscess 06/20/2018   Past Medical History:  Diagnosis Date   Anemia    Breast cancer ([place]) 03/2018   Cancer ([place]) 2019   Diabetes mellitus without complication ([facility])    GERD (gastroesophageal reflux disease)    Glaucoma    Hypercholesterolemia    Hypertension     SOCIAL HX:  Social History   Tobacco Use   Smoking status: Never   Smokeless tobacco: Never  Substance Use Topics   Alcohol use: Never     FAMILY HX:  Family History  Problem Relation Age of Onset   Diabetes Sister    Diabetes Brother    Diabetes Sister    Breast cancer Other    Hypertension Mother    Glaucoma Mother    Heart attack Father       ALLERGIES:  Allergies  Allergen Reactions   Shellfish Allergy Anaphylaxis   Levofloxacin Other (See Comments)    Confusion and nervouseness   Iodine     Patient unsure if allergic to topical betadine Shellfish allergy  with ingestion   Sulfa Antibiotics Itching      PERTINENT MEDICATIONS:  Outpatient Encounter Medications as of 06/06/2022  Medication Sig   acetaminophen (TYLENOL) 500 MG tablet Take 500 mg by mouth every 6 (six) hours as needed for moderate pain.    amLODipine (NORVASC) 2.5 MG tablet Take by mouth.   anastrozole (ARIMIDEX) 1 MG tablet Take 1 tablet (1 mg  total) by mouth daily.   atorvastatin (LIPITOR) 10 MG tablet Take 10 mg by mouth daily.   benazepril (LOTENSIN) 40 MG tablet Take 40 mg by mouth daily.   bisacodyl (DULCOLAX) 5 MG EC tablet Take 5 mg by mouth daily as needed for moderate constipation.   brimonidine (ALPHAGAN) 0.2 % ophthalmic solution    bumetanide (BUMEX) 1 MG tablet Take 1 mg by mouth daily.   carvedilol (COREG) 25 MG tablet Take 1 tablet by mouth 2 (two) times daily with a meal.   [employer]S VITAMIN B12 1000 MCG tablet TAKE 1 TABLET BY MOUTH EVERY DAY   dorzolamide-timolol (COSOPT) 22.3-6.8 MG/ML ophthalmic solution    DULoxetine (CYMBALTA) 30 MG capsule Take 30 mg by mouth daily.   latanoprost (XALATAN) 0.005 % ophthalmic solution    Multiple Vitamin (MULTI-VITAMINS) TABS Take 1 tablet by mouth daily.    omeprazole (PRILOSEC) 40 MG capsule Take 40 mg by mouth daily.   predniSONE (DELTASONE) 10 MG tablet Take by mouth as directed. (Patient not taking: Reported on 03/17/2022)   triamcinolone cream (KENALOG) 0.5 % Apply 1 application topically 2 (two) times daily.   No facility-administered encounter medications on file as of 06/06/2022.     Thank you for the opportunity to participate in the care of Jackie Allison.  The palliative care team will continue to follow. Please call our office at [phone] if we can be of additional assistance.   Note: Portions of this note were generated with [employer]. Dictation errors may occur despite best attempts at proofreading.  Teodoro Spray, NP

## 2022-06-06 NOTE — Addendum Note (Signed)
Addended by: Laverda Sorenson I on: 06/06/2022 04:37 PM   Modules accepted: Level of Service

## 2022-06-16 ENCOUNTER — Encounter: Payer: Self-pay | Admitting: Oncology

## 2022-06-21 ENCOUNTER — Other Ambulatory Visit: Payer: Self-pay | Admitting: Oncology

## 2022-07-09 ENCOUNTER — Encounter: Payer: Self-pay | Admitting: Oncology

## 2022-07-09 ENCOUNTER — Ambulatory Visit (INDEPENDENT_AMBULATORY_CARE_PROVIDER_SITE_OTHER): Payer: Medicare Other | Admitting: Podiatry

## 2022-07-09 ENCOUNTER — Encounter: Payer: Self-pay | Admitting: Podiatry

## 2022-07-09 DIAGNOSIS — M79676 Pain in unspecified toe(s): Secondary | ICD-10-CM

## 2022-07-09 DIAGNOSIS — R140 Abdominal distension (gaseous): Secondary | ICD-10-CM | POA: Insufficient documentation

## 2022-07-09 DIAGNOSIS — B351 Tinea unguium: Secondary | ICD-10-CM

## 2022-07-09 DIAGNOSIS — E663 Overweight: Secondary | ICD-10-CM | POA: Insufficient documentation

## 2022-07-09 NOTE — Progress Notes (Signed)
Jackie Allison presents today chief complaint of painful elongated toenails she is concerned about fungus and possibly ingrown.  Objective: Vital signs stable alert oriented x 3 I reviewed her past medical history medications allergies surgery social history.  Toenails are long thick yellow dystrophic and clinically mycotic.  Assessment: Pain in limb secondary onychomycosis.  Plan: Debridement of toenails 1 through 5 bilateral.

## 2022-08-04 ENCOUNTER — Other Ambulatory Visit: Payer: Self-pay | Admitting: Oncology

## 2022-08-04 DIAGNOSIS — D649 Anemia, unspecified: Secondary | ICD-10-CM

## 2022-09-15 ENCOUNTER — Encounter: Payer: Self-pay | Admitting: Oncology

## 2022-09-15 ENCOUNTER — Inpatient Hospital Stay (HOSPITAL_BASED_OUTPATIENT_CLINIC_OR_DEPARTMENT_OTHER): Payer: Medicare Other | Admitting: Oncology

## 2022-09-15 ENCOUNTER — Inpatient Hospital Stay: Payer: Medicare Other | Attending: Oncology

## 2022-09-15 ENCOUNTER — Other Ambulatory Visit: Payer: Self-pay

## 2022-09-15 VITALS — BP 118/59 | HR 79 | Temp 97.3°F | Resp 18 | Wt 147.0 lb

## 2022-09-15 DIAGNOSIS — Z853 Personal history of malignant neoplasm of breast: Secondary | ICD-10-CM

## 2022-09-15 DIAGNOSIS — Z79811 Long term (current) use of aromatase inhibitors: Secondary | ICD-10-CM | POA: Diagnosis not present

## 2022-09-15 DIAGNOSIS — M8580 Other specified disorders of bone density and structure, unspecified site: Secondary | ICD-10-CM | POA: Insufficient documentation

## 2022-09-15 DIAGNOSIS — Z170 Estrogen receptor positive status [ER+]: Secondary | ICD-10-CM | POA: Diagnosis not present

## 2022-09-15 DIAGNOSIS — C50312 Malignant neoplasm of lower-inner quadrant of left female breast: Secondary | ICD-10-CM | POA: Insufficient documentation

## 2022-09-15 DIAGNOSIS — D649 Anemia, unspecified: Secondary | ICD-10-CM

## 2022-09-15 DIAGNOSIS — D509 Iron deficiency anemia, unspecified: Secondary | ICD-10-CM

## 2022-09-15 DIAGNOSIS — Z803 Family history of malignant neoplasm of breast: Secondary | ICD-10-CM | POA: Diagnosis not present

## 2022-09-15 LAB — CBC WITH DIFFERENTIAL/PLATELET
Abs Immature Granulocytes: 0.02 10*3/uL (ref 0.00–0.07)
Basophils Absolute: 0 10*3/uL (ref 0.0–0.1)
Basophils Relative: 1 %
Eosinophils Absolute: 0.1 10*3/uL (ref 0.0–0.5)
Eosinophils Relative: 2 %
HCT: 35.3 % — ABNORMAL LOW (ref 36.0–46.0)
Hemoglobin: 11.3 g/dL — ABNORMAL LOW (ref 12.0–15.0)
Immature Granulocytes: 0 %
Lymphocytes Relative: 22 %
Lymphs Abs: 1.2 10*3/uL (ref 0.7–4.0)
MCH: 25.6 pg — ABNORMAL LOW (ref 26.0–34.0)
MCHC: 32 g/dL (ref 30.0–36.0)
MCV: 79.9 fL — ABNORMAL LOW (ref 80.0–100.0)
Monocytes Absolute: 0.4 10*3/uL (ref 0.1–1.0)
Monocytes Relative: 7 %
Neutro Abs: 3.6 10*3/uL (ref 1.7–7.7)
Neutrophils Relative %: 68 %
Platelets: 272 10*3/uL (ref 150–400)
RBC: 4.42 MIL/uL (ref 3.87–5.11)
RDW: 14.6 % (ref 11.5–15.5)
WBC: 5.3 10*3/uL (ref 4.0–10.5)
nRBC: 0 % (ref 0.0–0.2)

## 2022-09-15 LAB — COMPREHENSIVE METABOLIC PANEL
ALT: 17 U/L (ref 0–44)
AST: 22 U/L (ref 15–41)
Albumin: 3.8 g/dL (ref 3.5–5.0)
Alkaline Phosphatase: 63 U/L (ref 38–126)
Anion gap: 11 (ref 5–15)
BUN: 22 mg/dL (ref 8–23)
CO2: 21 mmol/L — ABNORMAL LOW (ref 22–32)
Calcium: 9.2 mg/dL (ref 8.9–10.3)
Chloride: 103 mmol/L (ref 98–111)
Creatinine, Ser: 0.91 mg/dL (ref 0.44–1.00)
GFR, Estimated: >60 mL/min (ref >60)
Glucose, Bld: 190 mg/dL — ABNORMAL HIGH (ref 70–99)
Potassium: 3.9 mmol/L (ref 3.5–5.1)
Sodium: 135 mmol/L (ref 135–145)
Total Bilirubin: 0.5 mg/dL (ref 0.3–1.2)
Total Protein: 7.2 g/dL (ref 6.5–8.1)

## 2022-09-15 LAB — IRON AND TIBC
Iron: 73 ug/dL (ref 28–170)
Saturation Ratios: 25 % (ref 10.4–31.8)
TIBC: 288 ug/dL (ref 250–450)
UIBC: 215 ug/dL

## 2022-09-15 LAB — FERRITIN: Ferritin: 65 ng/mL (ref 11–307)

## 2022-09-15 LAB — VITAMIN B12: Vitamin B-12: 2548 pg/mL — ABNORMAL HIGH (ref 180–914)

## 2022-09-15 NOTE — Progress Notes (Signed)
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 Nov 2021 resumed on Arimidex.  Clinically she is doing well Labs reviewed and discussed with patient Continue Arimidex.  Prescription refill sent to pharmacy  Continue annual mammogram screening.  Osteopenia continue calcium and vitamin D supplements.repeat DEXA Patient declines bisphosphate.  Iron deficiency anemia Hemoglobin slightly lower.  Will check iron TIBC ferritin-stable   Orders Placed This Encounter  Procedures   DG Bone Density    Standing Status:   Future    Standing Expiration Date:   09/15/2023    Order Specific Question:   Reason for Exam (SYMPTOM  OR DIAGNOSIS REQUIRED)    Answer:   hx breast cancer, osteopenia    Order Specific Question:   Preferred imaging location?    Answer:   [facility]   Iron and TIBC    Standing Status:   Future    Number of Occurrences:   1    Standing Expiration Date:   09/15/2023   Ferritin    Standing Status:   Future    Number of Occurrences:   1    Standing Expiration Date:   09/15/2023   CBC with Differential (Cancer Center Only)    Standing Status:   Future    Standing Expiration Date:   09/15/2023   CMP (Cancer Center only)    Standing Status:   Future    Standing Expiration Date:   09/15/2023   Follow-up in 6 months. All questions were answered. The patient knows to call the clinic with any problems, questions or concerns.  Rickard Patience, MD, PhD [facility] Hematology Oncology 09/15/2022     HISTORY OF PRESENTING ILLNESS:  Jackie Allison is a  82 y.o.  female with PMH listed below who was referred to me to reestablish care for breast cancer and anemia evaluation.  #  04/13/2018 stage IIa breast cancer pT2 pN0 Grade 2, ER positive, PR negative HER-2 negative.No Lymphovascular invasion # 06/19/2018 wound infection. Surgical wound culture positive for MRSA, as well as enterococcus faecalis. Patient was discharged on 06/22/2018. Oncotype DX recurrence score came back at 27, predicting distant recurrence risk at 9 years with tamoxifen alone around 16%.  Group average absolute chemotherapy benefit >15%.  Patient declined chemotherapy.  # finished adjuvant radiation-last RT 09/27/2018 #June 2020 start adjuvant antiestrogen treatment with Arimidex 1 mg daily  #Osteopenic, received a dental clearance for Zometa.  Started on Zometa 4 mg every 6 months.  Received last dose on 11/16/19.  Patient was offered Zometa on 05/04/2019 and she declined. # Bone pain, 03/04/2019 bone scan whole body findings fever degenerated changes or related to previous surgery.  No worrisome findings for osseous metastatic disease  # Patient was last seen by me in January 2021.  At that time patient has anemia and has had extensive blood work-up which did not reveal etiology of the anemia.  I had recommended bone marrow biopsy  at that time and patient declined.  She also had diffuse bone pain, bone scan was negative and I recommend patient to temporarily hold off Arimidex.  After that visit she transferred her oncology care to [facility] and was seen by Dr. Harvie Junior.  Patient was recommended to continue Arimidex which she has been doing since then.  Patient had admissions due to change of mental status, UTI, failure to thrive weight loss, decreased appetite. Patient was found to have progressively worsening of anemia, fatigue and was referred back to establish care with me for further evaluation.  Today patient was accompanied by her husband.  She states that she  wants to transfer her care back to me.  11/30/2019 reestablish care with me and then did not come for follow-up Patient re patient to ports that she had a fall and fractured her left forearm fracture status post fixation.  #Nov 2021 resumed on Arimidex. - 04/01/2021, bilateral screening mammogram showed no mammographic evidence of malignancy.    INTERVAL HISTORY Jackie Allison is a 82 y.o. female who has above history reviewed by me today presents to reestablish care for anemia, follow-up for breast cancer. She was accompanied by her husband.  She reports feeling well.  Chronic joint pain.  Unchanged.   Chronic intermittent breast pain at the site of previous surgery.   Patient has arthritis and back pain.  There is plan for cortisone injection No other new complaints.  04/15/2022, bilateral screening mammogram showed no mammographic evidence of malignancy.    Review of Systems  Constitutional:  Positive for fatigue. Negative for appetite change, chills, fever and unexpected weight change.  HENT:   Negative for hearing loss and voice change.   Eyes:  Negative for eye problems.  Respiratory:  Negative for chest tightness and cough.   Cardiovascular:  Negative for chest pain and leg swelling.  Gastrointestinal:  Negative for abdominal distention, abdominal pain and blood in stool.  Endocrine: Negative for hot flashes.  Genitourinary:  Negative for difficulty urinating and frequency.   Musculoskeletal:  Positive for arthralgias.       Chronic back pain Generalized bone pain  Skin:  Negative for itching and rash.  Neurological:  Negative for extremity weakness.  Hematological:  Negative for adenopathy.  Psychiatric/Behavioral:  Negative for confusion.     MEDICAL HISTORY:  Past Medical History:  Diagnosis Date   Anemia    chronic microcytic anemia   Breast abscess 06/20/2018   Breast cancer (HCC) 03/2018   Invasive Mammary   Cancer (HCC) 2019   left breast   Diabetes mellitus  without complication (HCC)    GERD (gastroesophageal reflux disease)    Glaucoma    Hypercholesterolemia    Hypertension     SURGICAL HISTORY: Past Surgical History:  Procedure Laterality Date   BACK SURGERY     BREAST BIOPSY Left 02/2018   BREAST EXCISIONAL BIOPSY Right ??   BREAST LUMPECTOMY Left 04/03/2018   Invasive Mammary CA Neg margins   CATARACT EXTRACTION W/ INTRAOCULAR LENS  IMPLANT, BILATERAL     EYE SURGERY Bilateral    cataract extraction   INCISION AND DRAINAGE ABSCESS Left 04/28/2018   Procedure: INCISION AND DRAINAGE ABSCESS;  Surgeon: Sung Amabile, DO;  Location: [facility] ORS;  Service: General;  Laterality: Left;   INCISION AND DRAINAGE ABSCESS Left 06/20/2018   Procedure: INCISION AND DRAINAGE ABSCESS;  Surgeon: Carolan Shiver, MD;  Location: [facility] ORS;  Service: General;  Laterality: Left;   LUMBAR FUSION  2002?   LUMBAR LAMINECTOMY  1997   titanium in back   PARTIAL MASTECTOMY WITH AXILLARY SENTINEL LYMPH NODE BIOPSY Left 04/13/2018   Procedure: PARTIAL MASTECTOMY  WITH AXILLARY SENTINEL LYMPH NODE BIOPSY  (No Needle Loc);  Surgeon: Sung Amabile, DO;  Location: [facility] ORS;  Service: General;  Laterality: Left;   RETINAL DETACHMENT SURGERY Bilateral 2009   TUBAL LIGATION      SOCIAL HISTORY: Social History   Socioeconomic History   Marital status: Married    Spouse name: Sharma Covert   Number of children: Not on file   Years of education: Not on file   Highest education level: Not on file  Occupational History   Occupation: Sales promotion account executive estate  Tobacco Use   Smoking status: Never   Smokeless tobacco: Never  Vaping Use   Vaping Use: Never used  Substance and Sexual Activity   Alcohol use: Never   Drug use: Never   Sexual activity: Not on file  Other Topics Concern   Not on file  Social History Narrative   Not on file   Social Determinants of Health   Financial Resource Strain: Not on file  Food Insecurity: Not on file  Transportation Needs: Not on  file  Physical Activity: Not on file  Stress: Not on file  Social Connections: Not on file  Intimate Partner Violence: Not on file    FAMILY HISTORY: Family History  Problem Relation Age of Onset   Diabetes Sister    Diabetes Brother    Diabetes Sister    Breast cancer Other    Hypertension Mother    Glaucoma Mother    Heart attack Father     ALLERGIES:  is allergic to shellfish allergy, levofloxacin, other, iodine, and sulfa antibiotics.  MEDICATIONS:  Current Outpatient Medications  Medication Sig Dispense Refill   acetaminophen (TYLENOL) 500 MG tablet Take 500 mg by mouth every 6 (six) hours as needed for moderate pain.      amLODipine (NORVASC) 2.5 MG tablet Take by mouth.     anastrozole (ARIMIDEX) 1 MG tablet TAKE 1 TABLET BY MOUTH EVERY DAY 90 tablet 1   atorvastatin (LIPITOR) 10 MG tablet Take 10 mg by mouth daily.     benazepril (LOTENSIN) 40 MG tablet Take 40 mg by mouth daily.     bisacodyl (DULCOLAX) 5 MG EC tablet Take 5 mg by mouth daily as needed for moderate constipation.     brimonidine (ALPHAGAN) 0.2 % ophthalmic solution      bumetanide (BUMEX) 1 MG tablet Take 1 mg by mouth daily.     carvedilol (COREG) 25 MG tablet Take 1 tablet by mouth 2 (two) times daily with a meal.     [employer] VITAMIN B12 1000 MCG tablet TAKE 1 TABLET BY MOUTH EVERY DAY 90 tablet 0   dorzolamide-timolol (COSOPT) 22.3-6.8 MG/ML ophthalmic solution      DULoxetine (CYMBALTA) 30 MG capsule Take 30 mg by mouth daily.     latanoprost (XALATAN) 0.005 % ophthalmic solution      losartan (COZAAR) 25 MG tablet      metFORMIN (GLUCOPHAGE-XR) 500 MG 24 hr tablet SMARTSIG:2 Tablet(s) By Mouth Every Evening     Multiple Vitamin (MULTI-VITAMINS) TABS Take 1 tablet by mouth daily.      omeprazole (PRILOSEC) 40 MG capsule Take 40 mg by mouth daily.     triamcinolone cream (KENALOG) 0.5 % Apply 1 application topically 2 (two) times daily.     No current facility-administered medications for this visit.      PHYSICAL EXAMINATION: ECOG PERFORMANCE STATUS: 1 - Symptomatic but completely ambulatory Vitals:   09/15/22 1325  BP: (!) 118/59  Pulse: 79  Resp: 18  Temp: (!) 97.3 F (36.3 C)   Filed Weights   09/15/22 1325  Weight: 147 lb (66.7 kg)    Physical Exam Constitutional:      Appearance: She is not ill-appearing.  HENT:     Head: Normocephalic and atraumatic.  Eyes:     General: No scleral icterus.    Pupils: Pupils are equal, round, and reactive to light.  Cardiovascular:     Rate and Rhythm: Normal rate and regular rhythm.     Heart sounds: Normal heart sounds.  Pulmonary:     Effort: Pulmonary effort is normal. No respiratory distress.     Breath sounds: No wheezing.  Abdominal:     General: Bowel sounds are normal. There is no distension.     Palpations: Abdomen is soft. There is no mass.     Tenderness: There is no abdominal tenderness.  Musculoskeletal:        General: No deformity. Normal range of motion.     Cervical back: Normal range of motion and neck supple.     Comments: Lower extremity edema  Skin:    General: Skin is warm and dry.  Neurological:     Mental Status: She is alert. Mental status is at baseline.  Psychiatric:        Mood and Affect: Mood normal.   Breast exam was performed in seated position due to patient's body habitus. Patient is status post left lumpectomy with a well-healed surgical scar, mild scar tissue thickening.  No palpable discrete mass in bilateral breast.  No palpable axillary lymphadenopathy bilaterally  LABORATORY DATA:  I have reviewed the data as listed    Latest Ref Rng & Units 09/15/2022    1:05 PM 03/17/2022    1:13 PM 08/12/2021   10:05 AM  CBC  WBC 4.0 - 10.5 K/uL 5.3  5.9  6.4   Hemoglobin 12.0 - 15.0 g/dL 16.1  09.6  04.5   Hematocrit 36.0 - 46.0 % 35.3  37.4  37.8   Platelets 150 - 400 K/uL 272  305  302       Latest Ref Rng & Units 09/15/2022    1:05 PM 03/17/2022    1:13 PM 08/12/2021   10:05 AM   CMP  Glucose 70 - 99 mg/dL 409  811  914   BUN 8 - 23 mg/dL 22  15  19    Creatinine 0.44 - 1.00 mg/dL 7.82  9.56  2.13   Sodium 135 - 145 mmol/L 135  135  135   Potassium 3.5 - 5.1 mmol/L 3.9  3.7  4.3   Chloride 98 - 111 mmol/L 103  106  103   CO2 22 - 32 mmol/L 21  21  23    Calcium 8.9 - 10.3 mg/dL 9.2  9.3  9.7   Total Protein 6.5 - 8.1 g/dL 7.2  7.6  8.0   Total Bilirubin 0.3 - 1.2 mg/dL 0.5  0.5  0.6   Alkaline Phos 38 - 126 U/L 63  64  76   AST 15 - 41 U/L 22  24  20    ALT 0 - 44 U/L 17  15  17       Iron/TIBC/Ferritin/ %Sat    Component Value Date/Time   IRON 73 09/15/2022 1305   TIBC 288 09/15/2022 1305   FERRITIN 65 09/15/2022 1305   IRONPCTSAT 25 09/15/2022 1305     RADIOGRAPHIC STUDIES: I have personally reviewed the radiological images as listed  and agreed with the findings in the report. No results found.

## 2022-09-15 NOTE — Progress Notes (Signed)
Pt here for follow up. No new breast problems. Pt wanting to know if it would be ok to get cortisone to hip.

## 2022-09-15 NOTE — Assessment & Plan Note (Signed)
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 Nov 2021 resumed on Arimidex.  Clinically she is doing well Labs reviewed and discussed with patient Continue Arimidex.  Prescription refill sent to pharmacy  Continue annual mammogram screening.

## 2022-09-15 NOTE — Assessment & Plan Note (Addendum)
continue calcium and vitamin D supplements.repeat DEXA Patient declines bisphosphate.

## 2022-09-15 NOTE — Assessment & Plan Note (Addendum)
Hemoglobin slightly lower.  Will check iron TIBC ferritin-stable

## 2022-11-05 ENCOUNTER — Ambulatory Visit
Admission: RE | Admit: 2022-11-05 | Discharge: 2022-11-05 | Disposition: A | Discharge status: Home / Self Care | Payer: Medicare Other | Source: Ambulatory Visit | Attending: Oncology | Admitting: Oncology

## 2022-11-05 DIAGNOSIS — Z79811 Long term (current) use of aromatase inhibitors: Secondary | ICD-10-CM | POA: Diagnosis present

## 2022-11-05 DIAGNOSIS — C50312 Malignant neoplasm of lower-inner quadrant of left female breast: Secondary | ICD-10-CM | POA: Diagnosis present

## 2022-11-05 DIAGNOSIS — Z170 Estrogen receptor positive status [ER+]: Secondary | ICD-10-CM | POA: Insufficient documentation

## 2022-12-05 ENCOUNTER — Other Ambulatory Visit: Payer: Self-pay | Admitting: Oncology

## 2022-12-08 ENCOUNTER — Encounter: Payer: Self-pay | Admitting: Oncology

## 2023-03-11 ENCOUNTER — Encounter: Payer: Self-pay | Admitting: Oncology

## 2023-03-18 ENCOUNTER — Other Ambulatory Visit: Payer: Self-pay

## 2023-03-18 ENCOUNTER — Inpatient Hospital Stay (HOSPITAL_BASED_OUTPATIENT_CLINIC_OR_DEPARTMENT_OTHER): Payer: Medicare Other | Admitting: Oncology

## 2023-03-18 ENCOUNTER — Encounter: Payer: Self-pay | Admitting: Oncology

## 2023-03-18 ENCOUNTER — Inpatient Hospital Stay: Payer: Medicare Other | Attending: Oncology

## 2023-03-18 VITALS — BP 116/58 | HR 74 | Temp 97.1°F | Resp 18 | Wt 146.6 lb

## 2023-03-18 DIAGNOSIS — Z1231 Encounter for screening mammogram for malignant neoplasm of breast: Secondary | ICD-10-CM | POA: Diagnosis not present

## 2023-03-18 DIAGNOSIS — C50312 Malignant neoplasm of lower-inner quadrant of left female breast: Secondary | ICD-10-CM | POA: Insufficient documentation

## 2023-03-18 DIAGNOSIS — D509 Iron deficiency anemia, unspecified: Secondary | ICD-10-CM | POA: Diagnosis not present

## 2023-03-18 DIAGNOSIS — Z170 Estrogen receptor positive status [ER+]: Secondary | ICD-10-CM | POA: Insufficient documentation

## 2023-03-18 DIAGNOSIS — M8580 Other specified disorders of bone density and structure, unspecified site: Secondary | ICD-10-CM

## 2023-03-18 DIAGNOSIS — Z803 Family history of malignant neoplasm of breast: Secondary | ICD-10-CM | POA: Insufficient documentation

## 2023-03-18 LAB — CBC WITH DIFFERENTIAL (CANCER CENTER ONLY)
Abs Immature Granulocytes: 0.01 10*3/uL (ref 0.00–0.07)
Basophils Absolute: 0 10*3/uL (ref 0.0–0.1)
Basophils Relative: 1 %
Eosinophils Absolute: 0.1 10*3/uL (ref 0.0–0.5)
Eosinophils Relative: 2 %
HCT: 34.4 % — ABNORMAL LOW (ref 36.0–46.0)
Hemoglobin: 10.8 g/dL — ABNORMAL LOW (ref 12.0–15.0)
Immature Granulocytes: 0 %
Lymphocytes Relative: 17 %
Lymphs Abs: 1 10*3/uL (ref 0.7–4.0)
MCH: 25.5 pg — ABNORMAL LOW (ref 26.0–34.0)
MCHC: 31.4 g/dL (ref 30.0–36.0)
MCV: 81.1 fL (ref 80.0–100.0)
Monocytes Absolute: 0.5 10*3/uL (ref 0.1–1.0)
Monocytes Relative: 9 %
Neutro Abs: 4.1 10*3/uL (ref 1.7–7.7)
Neutrophils Relative %: 71 %
Platelet Count: 265 10*3/uL (ref 150–400)
RBC: 4.24 MIL/uL (ref 3.87–5.11)
RDW: 15 % (ref 11.5–15.5)
WBC Count: 5.7 10*3/uL (ref 4.0–10.5)
nRBC: 0 % (ref 0.0–0.2)

## 2023-03-18 LAB — CMP (CANCER CENTER ONLY)
ALT: 15 U/L (ref 0–44)
AST: 18 U/L (ref 15–41)
Albumin: 3.7 g/dL (ref 3.5–5.0)
Alkaline Phosphatase: 60 U/L (ref 38–126)
Anion gap: 12 (ref 5–15)
BUN: 21 mg/dL (ref 8–23)
CO2: 22 mmol/L (ref 22–32)
Calcium: 9.6 mg/dL (ref 8.9–10.3)
Chloride: 104 mmol/L (ref 98–111)
Creatinine: 0.91 mg/dL (ref 0.44–1.00)
GFR, Estimated: >60 mL/min (ref >60)
Glucose, Bld: 188 mg/dL — ABNORMAL HIGH (ref 70–99)
Potassium: 4.5 mmol/L (ref 3.5–5.1)
Sodium: 138 mmol/L (ref 135–145)
Total Bilirubin: 0.5 mg/dL (ref <1.2)
Total Protein: 7 g/dL (ref 6.5–8.1)

## 2023-03-18 LAB — RETIC PANEL
Immature Retic Fract: 11.8 % (ref 2.3–15.9)
RBC.: 4.24 MIL/uL (ref 3.87–5.11)
Retic Count, Absolute: 51.7 10*3/uL (ref 19.0–186.0)
Retic Ct Pct: 1.2 % (ref 0.4–3.1)
Reticulocyte Hemoglobin: 27.8 pg — ABNORMAL LOW (ref >27.9)

## 2023-03-18 LAB — TECHNOLOGIST SMEAR REVIEW
Plt Morphology: NORMAL
RBC MORPHOLOGY: NORMAL
WBC MORPHOLOGY: NORMAL

## 2023-03-18 LAB — FERRITIN: Ferritin: 66 ng/mL (ref 11–307)

## 2023-03-18 LAB — IRON AND TIBC
Iron: 74 ug/dL (ref 28–170)
Saturation Ratios: 27 % (ref 10.4–31.8)
TIBC: 274 ug/dL (ref 250–450)
UIBC: 200 ug/dL

## 2023-03-18 LAB — FOLATE: Folate: >40 ng/mL (ref >5.9)

## 2023-03-18 MED ORDER — ANASTROZOLE 1 MG PO TABS: 1.0000 mg | ORAL_TABLET | Freq: Every day | ORAL | 1 refills | Status: DC

## 2023-03-18 MED ORDER — IRON-VITAMIN C 65-125 MG PO TABS: 1.0000 | ORAL_TABLET | Freq: Every day | ORAL | 5 refills | Status: AC

## 2023-03-18 NOTE — Assessment & Plan Note (Signed)
11/05/2022 DEXA  continue calcium and vitamin D supplements.repeat DEXA Patient declines bisphosphate.

## 2023-03-18 NOTE — Assessment & Plan Note (Addendum)
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 Nov 2021 resumed on Arimidex.  Clinically she is doing well Labs reviewed and discussed with patient Continue Arimidex 1mg  daily .  Prescription refill sent to pharmacy  Continue annual mammogram screening- Dec 2024

## 2023-03-18 NOTE — Progress Notes (Signed)
Hematology/Oncology Progress note [phone] [phone]) [phone]     REASON FOR VISIT:  Follow up for management  of Stage IIA breast cancer and anemia.   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 Nov 2021 resumed on Arimidex.  Clinically she is doing well Labs reviewed and discussed with patient Continue Arimidex 1mg  daily .  Prescription refill sent to pharmacy  Continue annual mammogram screening- Dec 2024  Osteopenia 11/05/2022 DEXA  continue calcium and vitamin D supplements.repeat DEXA Patient declines bisphosphate.  Iron deficiency anemia Hemoglobin slightly lower. Normal iron panel.  Retic hemoglobin is decreased.  Recommend patient to take oral iron supplementation.    Orders Placed This Encounter  Procedures   MM 3D SCREENING MAMMOGRAM BILATERAL BREAST    Standing Status:   Future    Standing Expiration Date:   03/17/2024    Order Specific Question:   Reason for Exam (SYMPTOM  OR DIAGNOSIS REQUIRED)    Answer:   Breast cancer    Order Specific Question:   Preferred imaging location?    Answer:   [facility]   Technologist smear review    Standing Status:   Future    Number of Occurrences:   1    Standing Expiration Date:   03/17/2024    Order Specific Question:   Clinical information:    Answer:   anemia   Iron and TIBC    Standing Status:   Future    Number of Occurrences:   1    Standing Expiration Date:   03/17/2024   Ferritin    Standing Status:   Future    Number of Occurrences:   1    Standing Expiration Date:   03/17/2024   Folate    Standing Status:   Future    Number of Occurrences:   1    Standing Expiration Date:   03/17/2024   Retic Panel    Standing Status:   Future    Number of Occurrences:   1    Standing Expiration Date:   03/17/2024   CBC with Differential (Cancer Center  Only)    Standing Status:   Future    Standing Expiration Date:   03/17/2024   CMP (Cancer Center only)    Standing Status:   Future    Standing Expiration Date:   03/17/2024   Retic Panel    Standing Status:   Future    Standing Expiration Date:   03/17/2024   Follow-up in 6 months. All questions were answered. The patient knows to call the clinic with any problems, questions or concerns.  Rickard Patience, MD, PhD [facility] Hematology Oncology 03/18/2023     HISTORY OF PRESENTING ILLNESS:  Jackie Allison is a  82 y.o.  female with PMH listed below who was referred to me to reestablish care for breast cancer and anemia evaluation.  #  04/13/2018 stage IIa breast cancer pT2 pN0 Grade 2, ER positive, PR negative HER-2 negative.No Lymphovascular invasion # 06/19/2018 wound infection. Surgical wound culture positive for MRSA, as well as enterococcus faecalis. Patient was discharged on 06/22/2018. Oncotype DX recurrence score came back at 27, predicting distant recurrence risk at 9 years with tamoxifen alone around 16%.  Group average absolute chemotherapy benefit >15%.  Patient declined chemotherapy.  # finished adjuvant radiation-last RT 09/27/2018 #June 2020 start adjuvant antiestrogen treatment with Arimidex  1 mg daily  #Osteopenic, received a dental clearance for Zometa.  Started on Zometa 4 mg every 6 months.  Received last dose on 11/16/19.  Patient was offered Zometa on 05/04/2019 and she declined. # Bone pain, 03/04/2019 bone scan whole body findings fever degenerated changes or related to previous surgery.  No worrisome findings for osseous metastatic disease  # Patient was last seen by me in January 2021.  At that time patient has anemia and has had extensive blood work-up which did not reveal etiology of the anemia.  I had recommended bone marrow biopsy  at that time and patient declined.  She also had diffuse bone pain, bone scan was negative and I recommend patient to temporarily hold  off Arimidex.  After that visit she transferred her oncology care to [facility] and was seen by Dr. Harvie Junior.  Patient was recommended to continue Arimidex which she has been doing since then.  Patient had admissions due to change of mental status, UTI, failure to thrive weight loss, decreased appetite. Patient was found to have progressively worsening of anemia, fatigue and was referred back to establish care with me for further evaluation.  Today patient was accompanied by her husband.  She states that she wants to transfer her care back to me.  11/30/2019 reestablish care with me and then did not come for follow-up Patient re patient to ports that she had a fall and fractured her left forearm fracture status post fixation.  #Nov 2021 resumed on Arimidex. - 04/01/2021, bilateral screening mammogram showed no mammographic evidence of malignancy.   04/15/2022, bilateral screening mammogram showed no mammographic evidence of malignancy.    INTERVAL HISTORY Jackie Allison is a 82 y.o. female who has above history reviewed by me today presents to reestablish care for anemia, follow-up for breast cancer. She was accompanied by her husband.  She reports feeling well.  Chronic joint pain.  Unchanged.   Chronic intermittent breast pain at the site of previous surgery.   Patient has arthritis and back pain.  No other new complaints.   Review of Systems  Constitutional:  Positive for fatigue. Negative for appetite change, chills, fever and unexpected weight change.  HENT:   Negative for hearing loss and voice change.   Eyes:  Negative for eye problems.  Respiratory:  Negative for chest tightness and cough.   Cardiovascular:  Negative for chest pain and leg swelling.  Gastrointestinal:  Negative for abdominal distention, abdominal pain and blood in stool.  Endocrine: Negative for hot flashes.  Genitourinary:  Negative for difficulty urinating and frequency.   Musculoskeletal:  Positive for arthralgias.        Chronic back pain Generalized bone pain  Skin:  Negative for itching and rash.  Neurological:  Negative for extremity weakness.  Hematological:  Negative for adenopathy.  Psychiatric/Behavioral:  Negative for confusion.     MEDICAL HISTORY:  Past Medical History:  Diagnosis Date   Anemia    chronic microcytic anemia   Breast abscess 06/20/2018   Breast cancer (HCC) 03/2018   Invasive Mammary   Cancer (HCC) 2019   left breast   Diabetes mellitus without complication (HCC)    GERD (gastroesophageal reflux disease)    Glaucoma    Hypercholesterolemia    Hypertension     SURGICAL HISTORY: Past Surgical History:  Procedure Laterality Date   BACK SURGERY     BREAST BIOPSY Left 02/2018   BREAST EXCISIONAL BIOPSY Right ??   BREAST LUMPECTOMY Left 04/03/2018  Invasive Mammary CA Neg margins   CATARACT EXTRACTION W/ INTRAOCULAR LENS  IMPLANT, BILATERAL     EYE SURGERY Bilateral    cataract extraction   INCISION AND DRAINAGE ABSCESS Left 04/28/2018   Procedure: INCISION AND DRAINAGE ABSCESS;  Surgeon: Sung Amabile, DO;  Location: [facility] ORS;  Service: General;  Laterality: Left;   INCISION AND DRAINAGE ABSCESS Left 06/20/2018   Procedure: INCISION AND DRAINAGE ABSCESS;  Surgeon: Carolan Shiver, MD;  Location: [facility] ORS;  Service: General;  Laterality: Left;   LUMBAR FUSION  2002?   LUMBAR LAMINECTOMY  1997   titanium in back   PARTIAL MASTECTOMY WITH AXILLARY SENTINEL LYMPH NODE BIOPSY Left 04/13/2018   Procedure: PARTIAL MASTECTOMY WITH AXILLARY SENTINEL LYMPH NODE BIOPSY  (No Needle Loc);  Surgeon: Sung Amabile, DO;  Location: [facility] ORS;  Service: General;  Laterality: Left;   RETINAL DETACHMENT SURGERY Bilateral 2009   TUBAL LIGATION      SOCIAL HISTORY: Social History   Socioeconomic History   Marital status: Married    Spouse name: Sharma Covert   Number of children: Not on file   Years of education: Not on file   Highest education level: Not on file  Occupational  History   Occupation: Sales promotion account executive estate  Tobacco Use   Smoking status: Never   Smokeless tobacco: Never  Vaping Use   Vaping status: Never Used  Substance and Sexual Activity   Alcohol use: Never   Drug use: Never   Sexual activity: Not on file  Other Topics Concern   Not on file  Social History Narrative   Not on file   Social Determinants of Health   Financial Resource Strain: Low Risk  (01/08/2023)   Received from [facility]   Overall Financial Resource Strain (CARDIA)    Difficulty of Paying Living Expenses: Not hard at all  Food Insecurity: No Food Insecurity (01/08/2023)   Received from [facility]   Hunger Vital Sign    Worried About Running Out of Food in the Last Year: Never true    Ran Out of Food in the Last Year: Never true  Transportation Needs: No Transportation Needs (01/08/2023)   Received from [facility] - Transportation    In the past 12 months, has lack of transportation kept you from medical appointments or from getting medications?: No    Lack of Transportation (Non-Medical): No  Physical Activity: Unknown (05/10/2019)   Received from [facility], [facility]   Exercise Vital Sign    Days of Exercise per Week: 0 days    Minutes of Exercise per Session: Not on file  Stress: No Stress Concern Present (05/10/2019)   Received from [facility], [facility]   [employer] of Occupational Health - Occupational Stress Questionnaire    Feeling of Stress : Only a little  Social Connections: Moderately Integrated (05/10/2019)   Received from [facility], [facility]   Social Connection and Isolation Panel [NHANES]    Frequency of Communication with Friends and Family: More than three times a week    Frequency of Social Gatherings with Friends and Family: Not on file    Attends Religious  Services: 1 to 4 times per year    Active Member of [employer] or Organizations: No    Attends Banker Meetings: Never    Marital Status: Married  Catering manager Violence: Not on file  FAMILY HISTORY: Family History  Problem Relation Age of Onset   Diabetes Sister    Diabetes Brother    Diabetes Sister    Breast cancer Other    Hypertension Mother    Glaucoma Mother    Heart attack Father     ALLERGIES:  is allergic to shellfish allergy, levofloxacin, other, iodine, and sulfa antibiotics.  MEDICATIONS:  Current Outpatient Medications  Medication Sig Dispense Refill   acetaminophen (TYLENOL) 500 MG tablet Take 500 mg by mouth every 6 (six) hours as needed for moderate pain.      amLODipine (NORVASC) 10 MG tablet Take 10 mg by mouth daily.     amLODipine (NORVASC) 2.5 MG tablet Take by mouth.     atorvastatin (LIPITOR) 10 MG tablet Take 10 mg by mouth daily.     atorvastatin (LIPITOR) 20 MG tablet TAKE 1 TABLET BY MOUTH EVERYDAY AT BEDTIME     benazepril (LOTENSIN) 40 MG tablet Take 40 mg by mouth daily.     bisacodyl (DULCOLAX) 5 MG EC tablet Take 5 mg by mouth daily as needed for moderate constipation.     brimonidine (ALPHAGAN) 0.2 % ophthalmic solution      bumetanide (BUMEX) 1 MG tablet Take 1 mg by mouth daily.     carvedilol (COREG) 25 MG tablet Take 1 tablet by mouth 2 (two) times daily with a meal.     [employer] VITAMIN B12 1000 MCG tablet TAKE 1 TABLET BY MOUTH EVERY DAY 90 tablet 0   dorzolamide-timolol (COSOPT) 22.3-6.8 MG/ML ophthalmic solution      DULoxetine (CYMBALTA) 30 MG capsule Take 30 mg by mouth daily.     latanoprost (XALATAN) 0.005 % ophthalmic solution      losartan (COZAAR) 25 MG tablet      metFORMIN (GLUCOPHAGE-XR) 500 MG 24 hr tablet SMARTSIG:2 Tablet(s) By Mouth Every Evening     Multiple Vitamin (MULTI-VITAMINS) TABS Take 1 tablet by mouth daily.      omeprazole (PRILOSEC) 40 MG capsule Take 40 mg by mouth daily.     triamcinolone cream  (KENALOG) 0.5 % Apply 1 application topically 2 (two) times daily.     anastrozole (ARIMIDEX) 1 MG tablet Take 1 tablet (1 mg total) by mouth daily. 90 tablet 1   No current facility-administered medications for this visit.     PHYSICAL EXAMINATION: ECOG PERFORMANCE STATUS: 1 - Symptomatic but completely ambulatory Vitals:   03/18/23 1417  BP: (!) 116/58  Pulse: 74  Resp: 18  Temp: (!) 97.1 F (36.2 C)  SpO2: 99%   Filed Weights   03/18/23 1417  Weight: 146 lb 9.6 oz (66.5 kg)    Physical Exam Constitutional:      Appearance: She is not ill-appearing.  HENT:     Head: Normocephalic and atraumatic.  Eyes:     General: No scleral icterus.    Pupils: Pupils are equal, round, and reactive to light.  Cardiovascular:     Rate and Rhythm: Normal rate and regular rhythm.     Heart sounds: Normal heart sounds.  Pulmonary:     Effort: Pulmonary effort is normal. No respiratory distress.     Breath sounds: No wheezing.  Abdominal:     General: Bowel sounds are normal. There is no distension.     Palpations: Abdomen is soft. There is no mass.     Tenderness: There is no abdominal tenderness.  Musculoskeletal:        General: No deformity. Normal range of motion.  Cervical back: Normal range of motion and neck supple.     Comments: Lower extremity edema  Skin:    General: Skin is warm and dry.  Neurological:     Mental Status: She is alert. Mental status is at baseline.  Psychiatric:        Mood and Affect: Mood normal.     LABORATORY DATA:  I have reviewed the data as listed    Latest Ref Rng & Units 03/18/2023    2:07 PM 09/15/2022    1:05 PM 03/17/2022    1:13 PM  CBC  WBC 4.0 - 10.5 K/uL 5.7  5.3  5.9   Hemoglobin 12.0 - 15.0 g/dL 16.1  09.6  04.5   Hematocrit 36.0 - 46.0 % 34.4  35.3  37.4   Platelets 150 - 400 K/uL 265  272  305       Latest Ref Rng & Units 03/18/2023    2:07 PM 09/15/2022    1:05 PM 03/17/2022    1:13 PM  CMP  Glucose 70 - 99 mg/dL  409  811  914   BUN 8 - 23 mg/dL 21  22  15    Creatinine 0.44 - 1.00 mg/dL 7.82  9.56  2.13   Sodium 135 - 145 mmol/L 138  135  135   Potassium 3.5 - 5.1 mmol/L 4.5  3.9  3.7   Chloride 98 - 111 mmol/L 104  103  106   CO2 22 - 32 mmol/L 22  21  21    Calcium 8.9 - 10.3 mg/dL 9.6  9.2  9.3   Total Protein 6.5 - 8.1 g/dL 7.0  7.2  7.6   Total Bilirubin <1.2 mg/dL 0.5  0.5  0.5   Alkaline Phos 38 - 126 U/L 60  63  64   AST 15 - 41 U/L 18  22  24    ALT 0 - 44 U/L 15  17  15       Iron/TIBC/Ferritin/ %Sat    Component Value Date/Time   IRON 74 03/18/2023 1407   TIBC 274 03/18/2023 1407   FERRITIN 66 03/18/2023 1407   IRONPCTSAT 27 03/18/2023 1407     RADIOGRAPHIC STUDIES: I have personally reviewed the radiological images as listed and agreed with the findings in the report. No results found.

## 2023-03-18 NOTE — Assessment & Plan Note (Addendum)
Hemoglobin slightly lower. Normal iron panel.  Retic hemoglobin is decreased.  Recommend patient to take oral iron supplementation.

## 2023-03-19 ENCOUNTER — Telehealth: Payer: Self-pay

## 2023-03-19 NOTE — Telephone Encounter (Signed)
Called patient and informed her that Dr. Cathie Hoops recommends that she start taking oral iron supplements. Patient states that she has some at home but has not been taking them because they cause constipation. She states that she spoke to Dr. Cathie Hoops yesterday in the office and she told patient to take stool softner with iron pills. Patient stated that she will start on iron pills today.

## 2023-03-19 NOTE — Telephone Encounter (Signed)
-----   Message from Rickard Patience sent at 03/18/2023  8:09 PM EST ----- Please let patient know that I recommend her to take oral iron supplementation. Rx sent.

## 2023-04-14 ENCOUNTER — Ambulatory Visit
Admission: RE | Admit: 2023-04-14 | Discharge: 2023-04-14 | Disposition: A | Discharge status: Home / Self Care | Payer: Medicare Other | Source: Ambulatory Visit | Attending: Oncology | Admitting: Oncology

## 2023-04-14 DIAGNOSIS — Z1231 Encounter for screening mammogram for malignant neoplasm of breast: Secondary | ICD-10-CM | POA: Insufficient documentation

## 2023-09-02 ENCOUNTER — Other Ambulatory Visit: Payer: Self-pay | Admitting: Oncology

## 2023-09-02 NOTE — Telephone Encounter (Signed)
 Pt will be seen on 5/21. Ok to send refill?

## 2023-09-03 ENCOUNTER — Encounter: Payer: Self-pay | Admitting: Oncology

## 2023-09-07 ENCOUNTER — Telehealth: Payer: Self-pay | Admitting: *Deleted

## 2023-09-07 MED ORDER — ANASTROZOLE 1 MG PO TABS: 1.0000 mg | ORAL_TABLET | Freq: Every day | ORAL | 0 refills | Status: DC

## 2023-09-07 NOTE — Telephone Encounter (Signed)
 Called the patient and asked how many pills she has because she is going to come and see Dr. Wilhelmenia Harada on the 21st and she is not sure how much because she already filled up her pills for the week from her daughter doing it.  She said it was best if we just go ahead and get 1 .  I told her on the phone that we will go ahead and get a 30-day supply month supply just in case.  The anastrozole  to the pharmacy and Dr. Wilhelmenia Harada will have to sign first

## 2023-09-16 ENCOUNTER — Inpatient Hospital Stay: Payer: Medicare Other | Admitting: Oncology

## 2023-09-16 ENCOUNTER — Inpatient Hospital Stay: Payer: Medicare Other | Attending: Oncology

## 2023-09-17 ENCOUNTER — Telehealth: Payer: Self-pay | Admitting: *Deleted

## 2023-09-17 NOTE — Telephone Encounter (Signed)
 Spoke to the patient and let her know that she her appointment was May 21.  She got her days mixed up .  I told her I would send a message to the scheduling team so that she can get another appointment.  Is okay with that

## 2023-09-29 ENCOUNTER — Other Ambulatory Visit: Payer: Self-pay | Admitting: Oncology

## 2023-10-19 ENCOUNTER — Other Ambulatory Visit: Payer: Self-pay

## 2023-10-19 ENCOUNTER — Inpatient Hospital Stay (HOSPITAL_BASED_OUTPATIENT_CLINIC_OR_DEPARTMENT_OTHER): Admitting: Oncology

## 2023-10-19 ENCOUNTER — Encounter: Payer: Self-pay | Admitting: Oncology

## 2023-10-19 ENCOUNTER — Inpatient Hospital Stay: Attending: Oncology

## 2023-10-19 VITALS — BP 96/61 | HR 100 | Temp 98.1°F | Resp 18 | Wt 140.3 lb

## 2023-10-19 DIAGNOSIS — C50312 Malignant neoplasm of lower-inner quadrant of left female breast: Secondary | ICD-10-CM | POA: Diagnosis present

## 2023-10-19 DIAGNOSIS — Z803 Family history of malignant neoplasm of breast: Secondary | ICD-10-CM | POA: Diagnosis not present

## 2023-10-19 DIAGNOSIS — Z1732 Human epidermal growth factor receptor 2 negative status: Secondary | ICD-10-CM | POA: Diagnosis not present

## 2023-10-19 DIAGNOSIS — M8580 Other specified disorders of bone density and structure, unspecified site: Secondary | ICD-10-CM | POA: Diagnosis not present

## 2023-10-19 DIAGNOSIS — Z79811 Long term (current) use of aromatase inhibitors: Secondary | ICD-10-CM | POA: Insufficient documentation

## 2023-10-19 DIAGNOSIS — D563 Thalassemia minor: Secondary | ICD-10-CM | POA: Insufficient documentation

## 2023-10-19 DIAGNOSIS — Z1231 Encounter for screening mammogram for malignant neoplasm of breast: Secondary | ICD-10-CM

## 2023-10-19 DIAGNOSIS — D509 Iron deficiency anemia, unspecified: Secondary | ICD-10-CM

## 2023-10-19 DIAGNOSIS — Z170 Estrogen receptor positive status [ER+]: Secondary | ICD-10-CM | POA: Diagnosis not present

## 2023-10-19 DIAGNOSIS — Z1722 Progesterone receptor negative status: Secondary | ICD-10-CM | POA: Diagnosis not present

## 2023-10-19 LAB — CBC WITH DIFFERENTIAL (CANCER CENTER ONLY)
Abs Immature Granulocytes: 0.02 10*3/uL (ref 0.00–0.07)
Basophils Absolute: 0.1 10*3/uL (ref 0.0–0.1)
Basophils Relative: 1 %
Eosinophils Absolute: 0.1 10*3/uL (ref 0.0–0.5)
Eosinophils Relative: 2 %
HCT: 34.9 % — ABNORMAL LOW (ref 36.0–46.0)
Hemoglobin: 11 g/dL — ABNORMAL LOW (ref 12.0–15.0)
Immature Granulocytes: 0 %
Lymphocytes Relative: 21 %
Lymphs Abs: 1.3 10*3/uL (ref 0.7–4.0)
MCH: 24.7 pg — ABNORMAL LOW (ref 26.0–34.0)
MCHC: 31.5 g/dL (ref 30.0–36.0)
MCV: 78.4 fL — ABNORMAL LOW (ref 80.0–100.0)
Monocytes Absolute: 0.5 10*3/uL (ref 0.1–1.0)
Monocytes Relative: 7 %
Neutro Abs: 4.4 10*3/uL (ref 1.7–7.7)
Neutrophils Relative %: 69 %
Platelet Count: 342 10*3/uL (ref 150–400)
RBC: 4.45 MIL/uL (ref 3.87–5.11)
RDW: 15.2 % (ref 11.5–15.5)
WBC Count: 6.4 10*3/uL (ref 4.0–10.5)
nRBC: 0 % (ref 0.0–0.2)

## 2023-10-19 LAB — RETIC PANEL
Immature Retic Fract: 8.7 % (ref 2.3–15.9)
RBC.: 4.5 MIL/uL (ref 3.87–5.11)
Retic Count, Absolute: 50.9 10*3/uL (ref 19.0–186.0)
Retic Ct Pct: 1.1 % (ref 0.4–3.1)
Reticulocyte Hemoglobin: 27.8 pg — ABNORMAL LOW (ref >27.9)

## 2023-10-19 LAB — CMP (CANCER CENTER ONLY)
ALT: 15 U/L (ref 0–44)
AST: 24 U/L (ref 15–41)
Albumin: 3.8 g/dL (ref 3.5–5.0)
Alkaline Phosphatase: 68 U/L (ref 38–126)
Anion gap: 13 (ref 5–15)
BUN: 24 mg/dL — ABNORMAL HIGH (ref 8–23)
CO2: 18 mmol/L — ABNORMAL LOW (ref 22–32)
Calcium: 9.6 mg/dL (ref 8.9–10.3)
Chloride: 101 mmol/L (ref 98–111)
Creatinine: 1.01 mg/dL — ABNORMAL HIGH (ref 0.44–1.00)
GFR, Estimated: 55 mL/min — ABNORMAL LOW (ref >60)
Glucose, Bld: 226 mg/dL — ABNORMAL HIGH (ref 70–99)
Potassium: 4.2 mmol/L (ref 3.5–5.1)
Sodium: 132 mmol/L — ABNORMAL LOW (ref 135–145)
Total Bilirubin: 0.6 mg/dL (ref 0.0–1.2)
Total Protein: 7.8 g/dL (ref 6.5–8.1)

## 2023-10-19 LAB — IRON AND TIBC
Iron: 54 ug/dL (ref 28–170)
Saturation Ratios: 19 % (ref 10.4–31.8)
TIBC: 283 ug/dL (ref 250–450)
UIBC: 229 ug/dL

## 2023-10-19 LAB — FERRITIN: Ferritin: 41 ng/mL (ref 11–307)

## 2023-10-19 MED ORDER — ANASTROZOLE 1 MG PO TABS
1.0000 mg | ORAL_TABLET | Freq: Every day | ORAL | 1 refills | Status: AC
Start: 2023-10-19 — End: ?

## 2023-10-19 NOTE — Assessment & Plan Note (Addendum)
 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 Recommend patient to stop oral iron  supplementation.

## 2023-10-19 NOTE — Progress Notes (Signed)
 Hematology/Oncology Progress note [phone] [phone]     REASON FOR VISIT:  Follow up for management  of Stage IIA breast cancer and anemia.   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 Nov 2021 resumed on Arimidex .  Clinically she is doing well Labs reviewed and discussed with patient Continue Arimidex  1mg  daily[started in 09/2018, till 03/2019, resumed in 02/2020, she will finish 5 years in April 2026] Prescription refill sent to pharmacy  Continue annual mammogram screening- Dec 2025  Osteopenia 11/05/2022 DEXA  continue calcium  and vitamin D supplements.repeat DEXA Patient declines bisphosphate.  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 Recommend patient to stop oral iron  supplementation.   Orders Placed This Encounter  Procedures   MM 3D SCREENING MAMMOGRAM BILATERAL BREAST    Standing Status:   Future    Expected Date:   04/19/2024    Expiration Date:   10/18/2024    Reason for Exam (SYMPTOM  OR DIAGNOSIS REQUIRED):   screening mammo    Preferred imaging location?:   [facility]   Iron  and TIBC    Standing Status:   Future    Number of Occurrences:   1    Expected Date:   10/19/2023    Expiration Date:   01/17/2024   Ferritin    Standing Status:   Future    Number of Occurrences:   1    Expected Date:   10/19/2023    Expiration Date:   01/17/2024   CMP (Cancer Center only)    Standing Status:   Future    Expected Date:   04/19/2024    Expiration Date:   07/18/2024   CBC with Differential (Cancer Center Only)    Standing Status:   Future    Expected Date:    04/19/2024    Expiration Date:   07/18/2024   Follow-up in 6 months. All questions were answered. The patient knows to call the clinic with any problems, questions or concerns.  Zelphia Cap, MD, PhD [facility] Hematology Oncology 10/19/2023     HISTORY OF PRESENTING ILLNESS:  Jackie Allison is a  83 y.o.  female with PMH listed below who was referred to me to reestablish care for breast cancer and anemia evaluation.  #  04/13/2018 stage IIa breast cancer pT2 pN0 Grade 2, ER positive, PR negative HER-2 negative.No Lymphovascular invasion # 06/19/2018 wound infection. Surgical wound culture positive for MRSA, as well as enterococcus faecalis. Patient was discharged on 06/22/2018. Oncotype DX recurrence score came back at 27, predicting distant recurrence risk at 9 years with tamoxifen alone around 16%.  Group average absolute chemotherapy benefit >15%.  Patient declined chemotherapy.  # finished adjuvant radiation-last RT 09/27/2018 #June 2020 start adjuvant antiestrogen treatment with Arimidex  1 mg daily  #Osteopenic, received a dental clearance for Zometa .  Started on Zometa  4 mg every 6 months.  Received last dose on 11/16/19.  Patient was offered Zometa  on 05/04/2019 and she declined. # Bone pain, 03/04/2019 bone scan whole body findings fever degenerated changes or related to previous surgery.  No worrisome findings for osseous metastatic disease  # Patient was  last seen by me in January 2021.  At that time patient has anemia and has had extensive blood work-up which did not reveal etiology of the anemia.  I had recommended bone marrow biopsy  at that time and patient declined.  She also had diffuse bone pain, bone scan was negative and I recommend patient to temporarily hold off Arimidex .  After that visit she transferred her oncology care to [facility] and was seen by Dr. Con Leach.  Patient was recommended to continue Arimidex  which she has been doing since then.  Patient had admissions due to  change of mental status, UTI, failure to thrive weight loss, decreased appetite. Patient was found to have progressively worsening of anemia, fatigue and was referred back to establish care with me for further evaluation.  Today patient was accompanied by her husband.  She states that she wants to transfer her care back to me.  11/30/2019 reestablish care with me and then did not come for follow-up Patient re patient to ports that she had a fall and fractured her left forearm fracture status post fixation.  #Nov 2021 resumed on Arimidex . - 04/01/2021, bilateral screening mammogram showed no mammographic evidence of malignancy.   04/15/2022, bilateral screening mammogram showed no mammographic evidence of malignancy.    INTERVAL HISTORY Jackie Allison is a 83 y.o. female who has above history reviewed by me today presents to reestablish care for anemia, follow-up for breast cancer. She was accompanied by her husband.  She reports feeling well.  Chronic joint pain.  Unchanged.   Chronic intermittent breast pain at the site of previous surgery.   Patient has arthritis and back pain.  No other new complaints.   Review of Systems  Constitutional:  Positive for fatigue. Negative for appetite change, chills, fever and unexpected weight change.  HENT:   Negative for hearing loss and voice change.   Eyes:  Negative for eye problems.  Respiratory:  Negative for chest tightness and cough.   Cardiovascular:  Negative for chest pain and leg swelling.  Gastrointestinal:  Negative for abdominal distention, abdominal pain and blood in stool.  Endocrine: Negative for hot flashes.  Genitourinary:  Negative for difficulty urinating and frequency.   Musculoskeletal:  Positive for arthralgias.       Chronic back pain Generalized bone pain  Skin:  Negative for itching and rash.  Neurological:  Negative for extremity weakness.  Hematological:  Negative for adenopathy.  Psychiatric/Behavioral:  Negative for  confusion.     MEDICAL HISTORY:  Past Medical History:  Diagnosis Date   Anemia    chronic microcytic anemia   Breast abscess 06/20/2018   Breast cancer (HCC) 03/2018   Invasive Mammary   Cancer (HCC) 2019   left breast   Diabetes mellitus without complication (HCC)    GERD (gastroesophageal reflux disease)    Glaucoma    Hypercholesterolemia    Hypertension     SURGICAL HISTORY: Past Surgical History:  Procedure Laterality Date   BACK SURGERY     BREAST BIOPSY Left 02/2018   BREAST EXCISIONAL BIOPSY Right ??   BREAST LUMPECTOMY Left 04/03/2018   Invasive Mammary CA Neg margins   CATARACT EXTRACTION W/ INTRAOCULAR LENS  IMPLANT, BILATERAL     EYE SURGERY Bilateral    cataract extraction   INCISION AND DRAINAGE ABSCESS Left 04/28/2018   Procedure: INCISION AND DRAINAGE ABSCESS;  Surgeon: Tye Millet, DO;  Location: [facility] ORS;  Service: General;  Laterality: Left;   INCISION AND DRAINAGE ABSCESS Left  06/20/2018   Procedure: INCISION AND DRAINAGE ABSCESS;  Surgeon: Rodolph Romano, MD;  Location: [facility] ORS;  Service: General;  Laterality: Left;   LUMBAR FUSION  2002?   LUMBAR LAMINECTOMY  1997   titanium in back   PARTIAL MASTECTOMY WITH AXILLARY SENTINEL LYMPH NODE BIOPSY Left 04/13/2018   Procedure: PARTIAL MASTECTOMY WITH AXILLARY SENTINEL LYMPH NODE BIOPSY  (No Needle Loc);  Surgeon: Tye Millet, DO;  Location: [facility] ORS;  Service: General;  Laterality: Left;   RETINAL DETACHMENT SURGERY Bilateral 2009   TUBAL LIGATION      SOCIAL HISTORY: Social History   Socioeconomic History   Marital status: Married    Spouse name: Pasco   Number of children: Not on file   Years of education: Not on file   Highest education level: Not on file  Occupational History   Occupation: Sales promotion account executive estate  Tobacco Use   Smoking status: Never   Smokeless tobacco: Never  Vaping Use   Vaping status: Never Used  Substance and Sexual Activity   Alcohol use: Never   Drug use:  Never   Sexual activity: Not on file  Other Topics Concern   Not on file  Social History Narrative   Not on file   Social Drivers of Health   Financial Resource Strain: Low Risk  (07/10/2023)   Received from [facility]   Overall Financial Resource Strain (CARDIA)    Difficulty of Paying Living Expenses: Not hard at all  Food Insecurity: No Food Insecurity (07/10/2023)   Received from [facility]   Hunger Vital Sign    Within the past 12 months, you worried that your food would run out before you got the money to buy more.: Never true    Within the past 12 months, the food you bought just didn't last and you didn't have money to get more.: Never true  Transportation Needs: No Transportation Needs (07/10/2023)   Received from [facility] - Transportation    In the past 12 months, has lack of transportation kept you from medical appointments or from getting medications?: No    Lack of Transportation (Non-Medical): No  Physical Activity: Unknown (05/10/2019)   Received from [facility]   Exercise Vital Sign    On average, how many days per week do you engage in moderate to strenuous exercise (like a brisk walk)?: 0 days    Minutes of Exercise per Session: Not on file  Stress: No Stress Concern Present (05/10/2019)   Received from [facility] - Occupational Stress Questionnaire    Feeling of Stress : Only a little  Social Connections: Moderately Integrated (05/10/2019)   Received from [facility]   Social Connection and Isolation Panel    In a typical week, how many times do you talk on the phone with family, friends, or neighbors?: More than three times a week    Frequency of Social Gatherings with Friends and Family: Not on file    How often do you attend church or religious services?: 1 to 4 times per year    Do you belong to  any clubs or organizations such as church groups, unions, fraternal or athletic groups, or school groups?: No    How often do you attend meetings of the clubs or organizations you belong to?: Never    Are you married, widowed, divorced, separated, never married, or living  with a partner?: Married  Catering manager Violence: Not on file    FAMILY HISTORY: Family History  Problem Relation Age of Onset   Diabetes Sister    Diabetes Brother    Diabetes Sister    Breast cancer Other    Hypertension Mother    Glaucoma Mother    Heart attack Father     ALLERGIES:  is allergic to shellfish allergy, levofloxacin, other, iodine, and sulfa antibiotics.  MEDICATIONS:  Current Outpatient Medications  Medication Sig Dispense Refill   acetaminophen  (TYLENOL ) 500 MG tablet Take 500 mg by mouth every 6 (six) hours as needed for moderate pain.      amLODipine  (NORVASC ) 10 MG tablet Take 10 mg by mouth daily.     amLODipine  (NORVASC ) 2.5 MG tablet Take by mouth.     atorvastatin  (LIPITOR) 10 MG tablet Take 10 mg by mouth daily.     atorvastatin  (LIPITOR) 20 MG tablet TAKE 1 TABLET BY MOUTH EVERYDAY AT BEDTIME     azithromycin (ZITHROMAX) 250 MG tablet Take by mouth.     benazepril  (LOTENSIN ) 40 MG tablet Take 40 mg by mouth daily.     bisacodyl  (DULCOLAX) 5 MG EC tablet Take 5 mg by mouth daily as needed for moderate constipation.     brimonidine  (ALPHAGAN ) 0.2 % ophthalmic solution      bumetanide (BUMEX) 1 MG tablet Take 1 mg by mouth daily.     carvedilol  (COREG ) 25 MG tablet Take 1 tablet by mouth 2 (two) times daily with a meal.     [employer] VITAMIN B12 1000 MCG tablet TAKE 1 TABLET BY MOUTH EVERY DAY 90 tablet 0   dorzolamide -timolol  (COSOPT ) 2-0.5 % ophthalmic solution Apply 1 drop to eye.     dorzolamide -timolol  (COSOPT ) 22.3-6.8 MG/ML ophthalmic solution      DULoxetine  (CYMBALTA ) 30 MG capsule Take 30 mg by mouth daily.     Iron -Vitamin C  65-125 MG TABS Take 1 tablet by mouth daily. 30 tablet  5   latanoprost  (XALATAN ) 0.005 % ophthalmic solution      losartan (COZAAR) 25 MG tablet      metFORMIN  (GLUCOPHAGE -XR) 500 MG 24 hr tablet SMARTSIG:2 Tablet(s) By Mouth Every Evening     Multiple Vitamin (MULTI-VITAMINS) TABS Take 1 tablet by mouth daily.      omeprazole (PRILOSEC) 40 MG capsule Take 40 mg by mouth daily.     triamcinolone cream (KENALOG) 0.5 % Apply 1 application topically 2 (two) times daily.     anastrozole  (ARIMIDEX ) 1 MG tablet Take 1 tablet (1 mg total) by mouth daily. 90 tablet 1   No current facility-administered medications for this visit.     PHYSICAL EXAMINATION: ECOG PERFORMANCE STATUS: 1 - Symptomatic but completely ambulatory Vitals:   10/19/23 1423 10/19/23 1432  BP: (!) 98/47 96/61  Pulse: 100   Resp: 18   Temp: 98.1 F (36.7 C)   SpO2: 99%    Filed Weights   10/19/23 1423  Weight: 140 lb 4.8 oz (63.6 kg)    Physical Exam Constitutional:      Appearance: She is not ill-appearing.  HENT:     Head: Normocephalic and atraumatic.   Eyes:     General: No scleral icterus.    Pupils: Pupils are equal, round, and reactive to light.    Cardiovascular:     Rate and Rhythm: Normal rate and regular rhythm.     Heart sounds: Normal heart sounds.  Pulmonary:     Effort: Pulmonary effort  is normal. No respiratory distress.     Breath sounds: No wheezing.  Abdominal:     General: Bowel sounds are normal. There is no distension.     Palpations: Abdomen is soft. There is no mass.     Tenderness: There is no abdominal tenderness.   Musculoskeletal:        General: No deformity. Normal range of motion.     Cervical back: Normal range of motion and neck supple.     Comments: Lower extremity edema   Skin:    General: Skin is warm and dry.   Neurological:     Mental Status: She is alert. Mental status is at baseline.   Psychiatric:        Mood and Affect: Mood normal.     LABORATORY DATA:  I have reviewed the data as listed    Latest  Ref Rng & Units 10/19/2023    2:12 PM 03/18/2023    2:07 PM 09/15/2022    1:05 PM  CBC  WBC 4.0 - 10.5 K/uL 6.4  5.7  5.3   Hemoglobin 12.0 - 15.0 g/dL 88.9  89.1  88.6   Hematocrit 36.0 - 46.0 % 34.9  34.4  35.3   Platelets 150 - 400 K/uL 342  265  272       Latest Ref Rng & Units 10/19/2023    2:12 PM 03/18/2023    2:07 PM 09/15/2022    1:05 PM  CMP  Glucose 70 - 99 mg/dL 773  811  809   BUN 8 - 23 mg/dL 24  21  22    Creatinine 0.44 - 1.00 mg/dL 8.98  9.08  9.08   Sodium 135 - 145 mmol/L 132  138  135   Potassium 3.5 - 5.1 mmol/L 4.2  4.5  3.9   Chloride 98 - 111 mmol/L 101  104  103   CO2 22 - 32 mmol/L 18  22  21    Calcium  8.9 - 10.3 mg/dL 9.6  9.6  9.2   Total Protein 6.5 - 8.1 g/dL 7.8  7.0  7.2   Total Bilirubin 0.0 - 1.2 mg/dL 0.6  0.5  0.5   Alkaline Phos 38 - 126 U/L 68  60  63   AST 15 - 41 U/L 24  18  22    ALT 0 - 44 U/L 15  15  17       Iron /TIBC/Ferritin/ %Sat    Component Value Date/Time   IRON  54 10/19/2023 1412   TIBC 283 10/19/2023 1412   FERRITIN 41 10/19/2023 1412   IRONPCTSAT 19 10/19/2023 1412     RADIOGRAPHIC STUDIES: I have personally reviewed the radiological images as listed and agreed with the findings in the report. No results found.

## 2023-10-19 NOTE — Assessment & Plan Note (Addendum)
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 Nov 2021 resumed on Arimidex .  Clinically she is doing well Labs reviewed and discussed with patient Continue Arimidex  1mg  daily[started in 09/2018, till 03/2019, resumed in 02/2020, she will finish 5 years in April 2026] Prescription refill sent to pharmacy  Continue annual mammogram screening- Dec 2025

## 2023-10-19 NOTE — Assessment & Plan Note (Signed)
 11/05/2022 DEXA  continue calcium and vitamin D supplements.repeat DEXA Patient declines bisphosphate.

## 2023-12-11 ENCOUNTER — Encounter: Payer: Self-pay | Admitting: Nurse Practitioner

## 2023-12-11 ENCOUNTER — Telehealth: Payer: Self-pay | Admitting: *Deleted

## 2023-12-11 ENCOUNTER — Inpatient Hospital Stay: Attending: Oncology | Admitting: Nurse Practitioner

## 2023-12-11 VITALS — BP 110/66 | HR 88 | Temp 98.8°F | Resp 16

## 2023-12-11 DIAGNOSIS — L988 Other specified disorders of the skin and subcutaneous tissue: Secondary | ICD-10-CM | POA: Insufficient documentation

## 2023-12-11 DIAGNOSIS — B372 Candidiasis of skin and nail: Secondary | ICD-10-CM | POA: Insufficient documentation

## 2023-12-11 DIAGNOSIS — Z853 Personal history of malignant neoplasm of breast: Secondary | ICD-10-CM | POA: Diagnosis not present

## 2023-12-11 DIAGNOSIS — Z803 Family history of malignant neoplasm of breast: Secondary | ICD-10-CM | POA: Diagnosis not present

## 2023-12-11 DIAGNOSIS — R2232 Localized swelling, mass and lump, left upper limb: Secondary | ICD-10-CM | POA: Diagnosis not present

## 2023-12-11 MED ORDER — NYSTATIN 100000 UNIT/GM EX POWD
1.0000 | Freq: Three times a day (TID) | CUTANEOUS | 1 refills | Status: AC
Start: 2023-12-11 — End: ?

## 2023-12-11 NOTE — Telephone Encounter (Signed)
 Patient called today stating that she had her husband to look at it as well as herself of the left breast and it looks like a reddish-black and it does not look right and she wanted to have somebody to look at it to see if it is okay or if there is something going on  the breast.  Husband said it looked different also when he looked at it.  I am sending it to Dr. Babara and also I am sending it Lauren if Dr. Babara does not have any time today.

## 2023-12-11 NOTE — Progress Notes (Signed)
 Pt in for [facility] visit states having swelling under left axilla and redness under left breast.

## 2023-12-11 NOTE — Telephone Encounter (Signed)
 Dr.Yu's schedule is full today and won't have and opening until next Thurs. She is ok with pt seeing Lauren today. Please contact pt to schedule.

## 2023-12-11 NOTE — Progress Notes (Signed)
 Symptom Management Clinic  [facility] at [facility] A Department of the [place]. [facility] [address], [address] [place], [place] [address] [phone] (phone) [phone] (fax)  Patient Care Team: Alla Amis, MD as PCP - General (Family Medicine) Babara Call, MD as Consulting Physician (Hematology and Oncology)   Name of the patient: Jackie Allison  [id number]  15-Sep-1940   Date of visit: 12/11/23  Diagnosis- Breast Cancer  Chief complaint/ Reason for visit- swelling under axilla  Heme/Onc history:  Oncology History  Malignant neoplasm of lower-inner quadrant of left breast in female, estrogen receptor positive (HCC)  04/28/2018 Initial Diagnosis   Breast cancer in female [facility])   06/11/2018 Cancer Staging   Staging form: Breast, AJCC 8th Edition - Pathologic stage from 06/11/2018: Stage IIA (pT2, pN0, cM0, G2, ER+, PR-, HER2-, Oncotype DX score: 27) - Signed by Babara Call, MD on 06/11/2018     Interval history- Shary Lamos is an 83 y.o. female with above history of breast cancer who presents to clinic for concerns of redness under her left breast. She initially noticed fullness of the left axilla and while performing self breast exam noticed redness of the left breast. She is unsure how long symptoms have been present. Does not endorse any masses. Denies unintentional weight loss, nipple discharge, unusual bone pain.   Review of systems- Review of Systems  Constitutional:  Negative for fever, malaise/fatigue and weight loss.  Skin:  Negative for itching and rash.     Allergies  Allergen Reactions   Shellfish Allergy Anaphylaxis   Levofloxacin Other (See Comments)    Confusion and nervouseness   Other     Other Reaction(s): Unknown  SDR FOR SCOPOLAMINE TRANSDERMAL 1.5MG  PATCH [id number] jar   Iodine     Patient unsure if allergic to topical betadine Shellfish allergy with ingestion   Sulfa Antibiotics Itching    Past  Medical History:  Diagnosis Date   Anemia    chronic microcytic anemia   Breast abscess 06/20/2018   Breast cancer (HCC) 03/2018   Invasive Mammary   Cancer (HCC) 2019   left breast   Diabetes mellitus without complication (HCC)    GERD (gastroesophageal reflux disease)    Glaucoma    Hypercholesterolemia    Hypertension     Past Surgical History:  Procedure Laterality Date   BACK SURGERY     BREAST BIOPSY Left 02/2018   BREAST EXCISIONAL BIOPSY Right ??   BREAST LUMPECTOMY Left 04/03/2018   Invasive Mammary CA Neg margins   CATARACT EXTRACTION W/ INTRAOCULAR LENS  IMPLANT, BILATERAL     EYE SURGERY Bilateral    cataract extraction   INCISION AND DRAINAGE ABSCESS Left 04/28/2018   Procedure: INCISION AND DRAINAGE ABSCESS;  Surgeon: Tye Millet, DO;  Location: [facility] ORS;  Service: General;  Laterality: Left;   INCISION AND DRAINAGE ABSCESS Left 06/20/2018   Procedure: INCISION AND DRAINAGE ABSCESS;  Surgeon: Rodolph Romano, MD;  Location: [facility] ORS;  Service: General;  Laterality: Left;   LUMBAR FUSION  2002?   LUMBAR LAMINECTOMY  1997   titanium in back   PARTIAL MASTECTOMY WITH AXILLARY SENTINEL LYMPH NODE BIOPSY Left 04/13/2018   Procedure: PARTIAL MASTECTOMY WITH AXILLARY SENTINEL LYMPH NODE BIOPSY  (No Needle Loc);  Surgeon: Tye Millet, DO;  Location: [facility] ORS;  Service: General;  Laterality: Left;   RETINAL DETACHMENT SURGERY Bilateral 2009   TUBAL LIGATION      Social History   Socioeconomic History  Marital status: Married    Spouse name: Pasco   Number of children: Not on file   Years of education: Not on file   Highest education level: Not on file  Occupational History   Occupation: teacher/real estate  Tobacco Use   Smoking status: Never   Smokeless tobacco: Never  Vaping Use   Vaping status: Never Used  Substance and Sexual Activity   Alcohol use: Never   Drug use: Never   Sexual activity: Not on file  Other Topics Concern   Not on file   Social History Narrative   Not on file   Social Drivers of Health   Financial Resource Strain: Low Risk  (07/10/2023)   Received from [facility]   Overall Financial Resource Strain (CARDIA)    Difficulty of Paying Living Expenses: Not hard at all  Food Insecurity: No Food Insecurity (07/10/2023)   Received from [facility]   Hunger Vital Sign    Within the past 12 months, you worried that your food would run out before you got the money to buy more.: Never true    Within the past 12 months, the food you bought just didn't last and you didn't have money to get more.: Never true  Transportation Needs: No Transportation Needs (07/10/2023)   Received from [facility] - Transportation    In the past 12 months, has lack of transportation kept you from medical appointments or from getting medications?: No    Lack of Transportation (Non-Medical): No  Physical Activity: Unknown (05/10/2019)   Received from [facility]   Exercise Vital Sign    On average, how many days per week do you engage in moderate to strenuous exercise (like a brisk walk)?: 0 days    Minutes of Exercise per Session: Not on file  Stress: No Stress Concern Present (05/10/2019)   Received from [facility] - Occupational Stress Questionnaire    Feeling of Stress : Only a little  Social Connections: Moderately Integrated (05/10/2019)   Received from [facility]   Social Connection and Isolation Panel    In a typical week, how many times do you talk on the phone with family, friends, or neighbors?: More than three times a week    Frequency of Social Gatherings with Friends and Family: Not on file    How often do you attend church or religious services?: 1 to 4 times per year    Do you belong to any clubs or organizations such as church groups, unions, fraternal or  athletic groups, or school groups?: No    How often do you attend meetings of the clubs or organizations you belong to?: Never    Are you married, widowed, divorced, separated, never married, or living with a partner?: Married  Intimate Partner Violence: Not on file    Family History  Problem Relation Age of Onset   Diabetes Sister    Diabetes Brother    Diabetes Sister    Breast cancer Other    Hypertension Mother    Glaucoma Mother    Heart attack Father      Current Outpatient Medications:    acetaminophen  (TYLENOL ) 500 MG tablet, Take 500 mg by mouth every 6 (six) hours as needed for moderate pain. , Disp: , Rfl:    anastrozole  (ARIMIDEX ) 1 MG tablet, Take 1 tablet (1 mg total) by mouth  daily., Disp: 90 tablet, Rfl: 1   atorvastatin  (LIPITOR) 10 MG tablet, Take 10 mg by mouth daily., Disp: , Rfl:    atorvastatin  (LIPITOR) 20 MG tablet, TAKE 1 TABLET BY MOUTH EVERYDAY AT BEDTIME, Disp: , Rfl:    benazepril  (LOTENSIN ) 40 MG tablet, Take 40 mg by mouth daily., Disp: , Rfl:    bisacodyl  (DULCOLAX) 5 MG EC tablet, Take 5 mg by mouth daily as needed for moderate constipation., Disp: , Rfl:    brimonidine  (ALPHAGAN ) 0.2 % ophthalmic solution, , Disp: , Rfl:    [employer] VITAMIN B12 1000 MCG tablet, TAKE 1 TABLET BY MOUTH EVERY DAY, Disp: 90 tablet, Rfl: 0   dorzolamide -timolol  (COSOPT ) 2-0.5 % ophthalmic solution, Apply 1 drop to eye., Disp: , Rfl:    latanoprost  (XALATAN ) 0.005 % ophthalmic solution, , Disp: , Rfl:    Multiple Vitamin (MULTI-VITAMINS) TABS, Take 1 tablet by mouth daily. , Disp: , Rfl:    nystatin  (MYCOSTATIN /NYSTOP ) powder, Apply 1 Application topically 3 (three) times daily., Disp: 30 g, Rfl: 1   omeprazole (PRILOSEC) 40 MG capsule, Take 40 mg by mouth daily., Disp: , Rfl:    triamcinolone cream (KENALOG) 0.5 %, Apply 1 application topically 2 (two) times daily., Disp: , Rfl:    amLODipine  (NORVASC ) 10 MG tablet, Take 10 mg by mouth daily., Disp: , Rfl:    amLODipine   (NORVASC ) 2.5 MG tablet, Take by mouth., Disp: , Rfl:    azithromycin (ZITHROMAX) 250 MG tablet, Take by mouth., Disp: , Rfl:    bumetanide (BUMEX) 1 MG tablet, Take 1 mg by mouth daily. (Patient not taking: Reported on 12/11/2023), Disp: , Rfl:    carvedilol  (COREG ) 25 MG tablet, Take 1 tablet by mouth 2 (two) times daily with a meal., Disp: , Rfl:    dorzolamide -timolol  (COSOPT ) 22.3-6.8 MG/ML ophthalmic solution, , Disp: , Rfl:    DULoxetine  (CYMBALTA ) 30 MG capsule, Take 30 mg by mouth daily., Disp: , Rfl:    Iron -Vitamin C  65-125 MG TABS, Take 1 tablet by mouth daily., Disp: 30 tablet, Rfl: 5   losartan (COZAAR) 25 MG tablet, , Disp: , Rfl:    metFORMIN  (GLUCOPHAGE -XR) 500 MG 24 hr tablet, SMARTSIG:2 Tablet(s) By Mouth Every Evening, Disp: , Rfl:   Physical exam:  Vitals:   12/11/23 1421  BP: 110/66  Pulse: 88  Resp: 16  Temp: 98.8 F (37.1 C)  TempSrc: Tympanic  SpO2: 99%   Physical Exam Vitals reviewed.  Constitutional:      Appearance: She is not ill-appearing.  Cardiovascular:     Rate and Rhythm: Normal rate and regular rhythm.  Pulmonary:     Effort: No respiratory distress.     Breath sounds: No wheezing.  Chest:  Breasts:    Left: No swelling, inverted nipple, mass, nipple discharge or tenderness.     Comments: Scattered bright red rash under pendulous left breast. Moist. No masses appreciated.  Abdominal:     General: There is no distension.     Tenderness: There is no abdominal tenderness.  Lymphadenopathy:     Upper Body:     Left upper body: No supraclavicular or axillary adenopathy.  Neurological:     Mental Status: She is alert and oriented to person, place, and time.  Psychiatric:        Mood and Affect: Mood normal.        Behavior: Behavior normal.     04/14/23- MM 3D Screening Mammogram CLINICAL DATA:  Screening.   EXAM:  DIGITAL SCREENING BILATERAL MAMMOGRAM WITH TOMOSYNTHESIS AND CAD   TECHNIQUE: Bilateral screening digital craniocaudal and  mediolateral oblique mammograms were obtained. Bilateral screening digital breast tomosynthesis was performed. The images were evaluated with computer-aided detection.   COMPARISON:  Previous exam(s).   ACR Breast Density Category b: There are scattered areas of fibroglandular density.   FINDINGS: There are no findings suspicious for malignancy.   IMPRESSION: No mammographic evidence of malignancy. A result letter of this screening mammogram will be mailed directly to the patient.   RECOMMENDATION: Screening mammogram in one year. (Code:SM-B-01Y)   BI-RADS CATEGORY  1: Negative.     Electronically Signed   By: Dina  Arceo M.D.   On: 04/15/2023 16:01  No results found.  Assessment and plan- Patient is a 83 y.o. female with history of breast cancer who presents to symptom management clinic for complaints of:   Skin irritation- likely d/t yeast of skin/moisture friction- start topical nystatin . RTC for reevaluation is symptoms do not improve Left axillary fullness- no masses. No apparent lymphadenopathy Symmetrical findings bilaterally and I suspect this is a normal finding. For now she will monitor her symptoms. If worsening I recommend reevaluation. She has mammogram scheduled for December 2025 and we can move up her imaging if needed. She's in agreement.    Visit Diagnosis 1. Skin yeast infection   2. Left axillary fullness    Patient expressed understanding and was in agreement with this plan. She also understands that She can call clinic at any time with any questions, concerns, or complaints.   Thank you for allowing me to participate in the care of this very pleasant patient.   Tinnie Dawn, DNP, AGNP-C, AOCNP [facility] at [facility] [phone]

## 2024-02-26 ENCOUNTER — Encounter: Payer: Self-pay | Admitting: Oncology

## 2024-03-09 ENCOUNTER — Encounter: Payer: Self-pay | Admitting: Oncology

## 2024-04-08 ENCOUNTER — Encounter: Payer: Self-pay | Admitting: Oncology

## 2024-04-09 ENCOUNTER — Encounter: Payer: Self-pay | Admitting: Oncology

## 2024-04-13 ENCOUNTER — Other Ambulatory Visit: Payer: Self-pay | Admitting: Oncology

## 2024-04-14 ENCOUNTER — Ambulatory Visit
Admission: RE | Admit: 2024-04-14 | Discharge: 2024-04-14 | Disposition: A | Discharge status: Home / Self Care | Source: Ambulatory Visit | Attending: Oncology | Admitting: Oncology

## 2024-04-14 DIAGNOSIS — C50312 Malignant neoplasm of lower-inner quadrant of left female breast: Secondary | ICD-10-CM | POA: Insufficient documentation

## 2024-04-14 DIAGNOSIS — Z1231 Encounter for screening mammogram for malignant neoplasm of breast: Secondary | ICD-10-CM | POA: Diagnosis present

## 2024-04-14 DIAGNOSIS — Z170 Estrogen receptor positive status [ER+]: Secondary | ICD-10-CM | POA: Diagnosis present

## 2024-04-22 NOTE — Progress Notes (Signed)
 ENCOUNTER: Patient Class :No patient class for patient encounter Department: [facility] CLINIC [address] [place] [address]  PATIENT: Patient Demographics      Name Patient ID SSN Gender Identity Birth Date   Loraina, Stauffer [id number] Female [birth year] (83 yrs)          Address Phone Email       [address] [place] [address] [phone] ([email or web address]            [place] Race         [facility] [place] or African American             Reg Status PCP Date Last Verified Next Review Date     Verified Alla Amis, [id number] 04/19/24 05/19/24           Marital Status Religion Language       Married [employer]              EMERGENCY CONTACT: Name Relationship Lgl Grd Work Administrator, Sports  1. Mclane,NORMAN Spouse   [phone]    GUARANTOR: There is no guarantor information entered for this encounter.  COVERAGE: Primary Visit Coverage      Payer Plan Group Number Group Name Payer Phone Plan Phone   No coverage found                Secondary Visit Coverage      Payer Plan Group Number Group Name Payer Phone Plan Phone   No coverage found                Primary Coverage      Payer Plan Group Number Group Name Payer Phone Plan Phone   MEDICARE MEDICARE A AND B               Primary Subscriber      Subscriber ID Subscriber Name Subscriber [facility] Subscriber Address   [id number] [facility] [id number] [address] [place], [place] [address]           Secondary Coverage      Payer Plan Group Number Group Name Payer Phone Plan Phone   [facility] AMERICAN [facility] [place] [phone]   [phone]           Secondary Subscriber      Subscriber ID Subscriber Name Subscriber [facility] Subscriber Address   [id number] [facility] [id number] [address] [place], [place] [address]

## 2024-04-25 ENCOUNTER — Inpatient Hospital Stay: Attending: Oncology

## 2024-04-25 ENCOUNTER — Encounter: Payer: Self-pay | Admitting: Oncology

## 2024-04-25 ENCOUNTER — Inpatient Hospital Stay: Admitting: Oncology

## 2024-04-25 VITALS — BP 125/59 | HR 91 | Temp 97.5°F | Resp 18 | Wt 139.3 lb

## 2024-04-25 DIAGNOSIS — R7989 Other specified abnormal findings of blood chemistry: Secondary | ICD-10-CM | POA: Insufficient documentation

## 2024-04-25 DIAGNOSIS — M8580 Other specified disorders of bone density and structure, unspecified site: Secondary | ICD-10-CM | POA: Diagnosis not present

## 2024-04-25 DIAGNOSIS — Z79811 Long term (current) use of aromatase inhibitors: Secondary | ICD-10-CM | POA: Insufficient documentation

## 2024-04-25 DIAGNOSIS — Z1722 Progesterone receptor negative status: Secondary | ICD-10-CM | POA: Diagnosis not present

## 2024-04-25 DIAGNOSIS — Z803 Family history of malignant neoplasm of breast: Secondary | ICD-10-CM | POA: Diagnosis not present

## 2024-04-25 DIAGNOSIS — C50312 Malignant neoplasm of lower-inner quadrant of left female breast: Secondary | ICD-10-CM | POA: Diagnosis not present

## 2024-04-25 DIAGNOSIS — Z170 Estrogen receptor positive status [ER+]: Secondary | ICD-10-CM

## 2024-04-25 DIAGNOSIS — Z1732 Human epidermal growth factor receptor 2 negative status: Secondary | ICD-10-CM | POA: Insufficient documentation

## 2024-04-25 LAB — CMP (CANCER CENTER ONLY)
ALT: 13 U/L (ref 0–44)
AST: 19 U/L (ref 15–41)
Albumin: 4.1 g/dL (ref 3.5–5.0)
Alkaline Phosphatase: 74 U/L (ref 38–126)
Anion gap: 16 — ABNORMAL HIGH (ref 5–15)
BUN: 18 mg/dL (ref 8–23)
CO2: 22 mmol/L (ref 22–32)
Calcium: 9.9 mg/dL (ref 8.9–10.3)
Chloride: 102 mmol/L (ref 98–111)
Creatinine: 1.03 mg/dL — ABNORMAL HIGH (ref 0.44–1.00)
GFR, Estimated: 53 mL/min — ABNORMAL LOW
Glucose, Bld: 191 mg/dL — ABNORMAL HIGH (ref 70–99)
Potassium: 4.9 mmol/L (ref 3.5–5.1)
Sodium: 139 mmol/L (ref 135–145)
Total Bilirubin: 0.3 mg/dL (ref 0.0–1.2)
Total Protein: 7.6 g/dL (ref 6.5–8.1)

## 2024-04-25 LAB — CBC WITH DIFFERENTIAL (CANCER CENTER ONLY)
Abs Immature Granulocytes: 0.02 K/uL (ref 0.00–0.07)
Basophils Absolute: 0 K/uL (ref 0.0–0.1)
Basophils Relative: 1 %
Eosinophils Absolute: 0.1 K/uL (ref 0.0–0.5)
Eosinophils Relative: 1 %
HCT: 35.9 % — ABNORMAL LOW (ref 36.0–46.0)
Hemoglobin: 11.5 g/dL — ABNORMAL LOW (ref 12.0–15.0)
Immature Granulocytes: 0 %
Lymphocytes Relative: 24 %
Lymphs Abs: 1.4 K/uL (ref 0.7–4.0)
MCH: 25.2 pg — ABNORMAL LOW (ref 26.0–34.0)
MCHC: 32 g/dL (ref 30.0–36.0)
MCV: 78.6 fL — ABNORMAL LOW (ref 80.0–100.0)
Monocytes Absolute: 0.6 K/uL (ref 0.1–1.0)
Monocytes Relative: 9 %
Neutro Abs: 3.9 K/uL (ref 1.7–7.7)
Neutrophils Relative %: 65 %
Platelet Count: 314 K/uL (ref 150–400)
RBC: 4.57 MIL/uL (ref 3.87–5.11)
RDW: 15.2 % (ref 11.5–15.5)
WBC Count: 5.9 K/uL (ref 4.0–10.5)
nRBC: 0 % (ref 0.0–0.2)

## 2024-04-25 MED ORDER — ANASTROZOLE 1 MG PO TABS: 1.0000 mg | ORAL_TABLET | Freq: Every day | ORAL | 3 refills | Status: AC

## 2024-04-25 NOTE — Assessment & Plan Note (Signed)
 11/05/2022 DEXA recommend repeat in July 2026 continue calcium  and vitamin D supplements.repeat DEXA Patient declines bisphosphate.

## 2024-04-25 NOTE — Assessment & Plan Note (Signed)
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 Nov 2021 resumed on Arimidex .  Clinically she is doing well Labs reviewed and discussed with patient Continue Arimidex  1mg  daily[started in 09/2018, till 03/2019, resumed in 02/2020, monte will finish 5 years in April 2026] Prescription refill sent to pharmacy  Continue annual mammogram screening- Dec 2026, recent mammogram results were reviewed and discussed with patient.

## 2024-04-25 NOTE — Progress Notes (Signed)
 " Hematology/Oncology Progress note [phone] [phone]     REASON FOR VISIT:  Follow up for management  of Stage IIA breast cancer and anemia.   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 Nov 2021 resumed on Arimidex .  Clinically she is doing well Labs reviewed and discussed with patient Continue Arimidex  1mg  daily[started in 09/2018, till 03/2019, resumed in 02/2020, monte will finish 5 years in April 2026] Prescription refill sent to pharmacy  Continue annual mammogram screening- Dec 2026, recent mammogram results were reviewed and discussed with patient.  Osteopenia 11/05/2022 DEXA recommend repeat in July 2026 continue calcium  and vitamin D supplements.repeat DEXA Patient declines bisphosphate.  Elevated serum creatinine Patient is on Bumex.  Avoid nephrotoxins.   Orders Placed This Encounter  Procedures   DG Bone Density    Standing Status:   Future    Expected Date:   11/23/2024    Expiration Date:   04/25/2025    Reason for Exam (SYMPTOM  OR DIAGNOSIS REQUIRED):   AI Use , hx breast cancer    Preferred imaging location?:   [facility]   CMP (Cancer Center only)    Standing Status:   Future    Expected Date:   11/23/2024    Expiration Date:   02/21/2025   CBC with Differential (Cancer Center Only)    Standing Status:   Future    Expected Date:   11/23/2024    Expiration Date:   02/21/2025   Follow-up in 6 months. All questions were answered. The patient knows to call the clinic with any problems, questions or concerns.  Jackie Cap, MD, PhD [facility] Hematology Oncology 04/25/2024     HISTORY OF PRESENTING ILLNESS:  Jackie Allison is a  83 y.o.  female with PMH listed below who was referred to me to reestablish care for breast cancer and anemia evaluation.  #  04/13/2018 stage IIa breast cancer  pT2 pN0 Grade 2, ER positive, PR negative HER-2 negative.No Lymphovascular invasion # 06/19/2018 wound infection. Surgical wound culture positive for MRSA, as well as enterococcus faecalis. Patient was discharged on 06/22/2018. Oncotype DX recurrence score came back at 27, predicting distant recurrence risk at 9 years with tamoxifen alone around 16%.  Group average absolute chemotherapy benefit >15%.  Patient declined chemotherapy.  # finished adjuvant radiation-last RT 09/27/2018 #June 2020 start adjuvant antiestrogen treatment with Arimidex  1 mg daily  #Osteopenic, received a dental clearance for Zometa .  Started on Zometa  4 mg every 6 months.  Received last dose on 11/16/19.  Patient was offered Zometa  on 05/04/2019 and she declined. # Bone pain, 03/04/2019 bone scan whole body findings fever degenerated changes or related to previous surgery.  No worrisome findings for osseous metastatic disease  # Patient was last seen by me in January 2021.  At that time patient has anemia and has had extensive blood work-up which did not reveal etiology of the anemia.  I had recommended bone marrow biopsy  at that time and patient declined.  She also had diffuse bone pain, bone scan was negative and I recommend patient to temporarily hold off Arimidex .  After that visit she transferred her oncology care to [facility] and was seen by Dr. Con Allison.  Patient was recommended to continue Arimidex  which she has been doing since then.  Patient had admissions due to change of mental status, UTI,  failure to thrive weight loss, decreased appetite. Patient was found to have progressively worsening of anemia, fatigue and was referred back to establish care with me for further evaluation.  Today patient was accompanied by her husband.  She states that she wants to transfer her care back to me.  11/30/2019 reestablish care with me and then did not come for follow-up Patient re patient to ports that she had a fall and fractured her  left forearm fracture status post fixation.  #Nov 2021 resumed on Arimidex . - 04/01/2021, bilateral screening mammogram showed no mammographic evidence of malignancy.   04/15/2022, bilateral screening mammogram showed no mammographic evidence of malignancy.    INTERVAL HISTORY Jackie Allison is a 83 y.o. female who has above history reviewed by me today presents to reestablish care for anemia, follow-up for breast cancer. She was accompanied by her husband.  She reports feeling well.  Chronic joint pain.  Unchanged.   Chronic intermittent breast pain at the site of previous surgery.   Patient has arthritis and back pain.  No other new complaints.   Review of Systems  Constitutional:  Positive for fatigue. Negative for appetite change, chills, fever and unexpected weight change.  HENT:   Negative for hearing loss and voice change.   Eyes:  Negative for eye problems.  Respiratory:  Negative for chest tightness and cough.   Cardiovascular:  Negative for chest pain and leg swelling.  Gastrointestinal:  Negative for abdominal distention, abdominal pain and blood in stool.  Endocrine: Negative for hot flashes.  Genitourinary:  Negative for difficulty urinating and frequency.   Musculoskeletal:  Positive for arthralgias.       Chronic back pain Generalized bone pain  Skin:  Negative for itching and rash.  Neurological:  Negative for extremity weakness.  Hematological:  Negative for adenopathy.  Psychiatric/Behavioral:  Negative for confusion.     MEDICAL HISTORY:  Past Medical History:  Diagnosis Date   Anemia    chronic microcytic anemia   Breast abscess 06/20/2018   Breast cancer (HCC) 03/2018   Invasive Mammary   Cancer (HCC) 2019   left breast   Diabetes mellitus without complication (HCC)    GERD (gastroesophageal reflux disease)    Glaucoma    Hypercholesterolemia    Hypertension     SURGICAL HISTORY: Past Surgical History:  Procedure Laterality Date   BACK SURGERY      BREAST BIOPSY Left 02/2018   BREAST EXCISIONAL BIOPSY Right ??   BREAST LUMPECTOMY Left 04/03/2018   Invasive Mammary CA Neg margins   CATARACT EXTRACTION W/ INTRAOCULAR LENS  IMPLANT, BILATERAL     EYE SURGERY Bilateral    cataract extraction   INCISION AND DRAINAGE ABSCESS Left 04/28/2018   Procedure: INCISION AND DRAINAGE ABSCESS;  Surgeon: Tye Millet, DO;  Location: [facility] ORS;  Service: General;  Laterality: Left;   INCISION AND DRAINAGE ABSCESS Left 06/20/2018   Procedure: INCISION AND DRAINAGE ABSCESS;  Surgeon: Rodolph Romano, MD;  Location: [facility] ORS;  Service: General;  Laterality: Left;   LUMBAR FUSION  2002?   LUMBAR LAMINECTOMY  1997   titanium in back   PARTIAL MASTECTOMY WITH AXILLARY SENTINEL LYMPH NODE BIOPSY Left 04/13/2018   Procedure: PARTIAL MASTECTOMY WITH AXILLARY SENTINEL LYMPH NODE BIOPSY  (No Needle Loc);  Surgeon: Tye Millet, DO;  Location: [facility] ORS;  Service: General;  Laterality: Left;   RETINAL DETACHMENT SURGERY Bilateral 2009   TUBAL LIGATION      SOCIAL HISTORY: Social History   Socioeconomic  History   Marital status: Married    Spouse name: Pasco   Number of children: Not on file   Years of education: Not on file   Highest education level: Not on file  Occupational History   Occupation: teacher/real estate  Tobacco Use   Smoking status: Never   Smokeless tobacco: Never  Vaping Use   Vaping status: Never Used  Substance and Sexual Activity   Alcohol use: Never   Drug use: Never   Sexual activity: Not on file  Other Topics Concern   Not on file  Social History Narrative   Not on file   Social Drivers of Health   Tobacco Use: Low Risk (04/25/2024)   Patient History    Smoking Tobacco Use: Never    Smokeless Tobacco Use: Never    Passive Exposure: Not on file  Recent Concern: Tobacco Use - Medium Risk (04/22/2024)   Received from [facility]   Patient History    Smoking Tobacco Use: Never    Smokeless  Tobacco Use: Never    Passive Exposure: Yes  Financial Resource Strain: Low Risk  (07/10/2023)   Received from [facility]   Overall Financial Resource Strain (CARDIA)    Difficulty of Paying Living Expenses: Not hard at all  Food Insecurity: No Food Insecurity (07/10/2023)   Received from [facility]   Epic    Within the past 12 months, you worried that your food would run out before you got the money to buy more.: Never true    Within the past 12 months, the food you bought just didn't last and you didn't have money to get more.: Never true  Transportation Needs: No Transportation Needs (07/10/2023)   Received from [facility] - Transportation    In the past 12 months, has lack of transportation kept you from medical appointments or from getting medications?: No    Lack of Transportation (Non-Medical): No  Physical Activity: Not on file  Stress: Not on file  Social Connections: Not on file  Intimate Partner Violence: Not on file  Depression (EYV7-0): Not on file  Alcohol Screen: Not on file  Housing: Low Risk  (07/10/2023)   Received from [facility]   Epic    In the last 12 months, was there a time when you were not able to pay the mortgage or rent on time?: No    In the past 12 months, how many times have you moved where you were living?: 0    At any time in the past 12 months, were you homeless or living in a shelter (including now)?: No  Utilities: Not At Risk (07/10/2023)   Received from [facility] Utilities    Threatened with loss of utilities: No  Health Literacy: Not on file    FAMILY HISTORY: Family History  Problem Relation Age of Onset   Diabetes Sister    Diabetes Brother    Diabetes Sister    Breast cancer Other    Hypertension Mother    Glaucoma Mother    Heart attack Father     ALLERGIES:  is allergic to shellfish allergy, levofloxacin, other, iodine,  and sulfa antibiotics.  MEDICATIONS:  Current Outpatient Medications  Medication Sig Dispense Refill   acetaminophen  (TYLENOL ) 500 MG tablet Take 500 mg by mouth every 6 (six) hours as needed for moderate pain.      amLODipine  (NORVASC )  10 MG tablet Take 10 mg by mouth daily.     amLODipine  (NORVASC ) 2.5 MG tablet Take by mouth.     atorvastatin  (LIPITOR) 10 MG tablet Take 10 mg by mouth daily.     atorvastatin  (LIPITOR) 20 MG tablet TAKE 1 TABLET BY MOUTH EVERYDAY AT BEDTIME     azithromycin (ZITHROMAX) 250 MG tablet Take by mouth.     benazepril  (LOTENSIN ) 40 MG tablet Take 40 mg by mouth daily.     bisacodyl  (DULCOLAX) 5 MG EC tablet Take 5 mg by mouth daily as needed for moderate constipation.     brimonidine  (ALPHAGAN ) 0.2 % ophthalmic solution      carvedilol  (COREG ) 25 MG tablet Take 1 tablet by mouth 2 (two) times daily with a meal.     [employer] VITAMIN B12 1000 MCG tablet TAKE 1 TABLET BY MOUTH EVERY DAY 90 tablet 0   dorzolamide -timolol  (COSOPT ) 2-0.5 % ophthalmic solution Apply 1 drop to eye.     dorzolamide -timolol  (COSOPT ) 22.3-6.8 MG/ML ophthalmic solution      DULoxetine  (CYMBALTA ) 30 MG capsule Take 30 mg by mouth daily.     Iron -Vitamin C  65-125 MG TABS Take 1 tablet by mouth daily. 30 tablet 5   latanoprost  (XALATAN ) 0.005 % ophthalmic solution      losartan (COZAAR) 25 MG tablet      meloxicam (MOBIC) 15 MG tablet Take 15 mg by mouth.     metFORMIN  (GLUCOPHAGE -XR) 500 MG 24 hr tablet SMARTSIG:2 Tablet(s) By Mouth Every Evening     Multiple Vitamin (MULTI-VITAMINS) TABS Take 1 tablet by mouth daily.      nystatin  (MYCOSTATIN /NYSTOP ) powder Apply 1 Application topically 3 (three) times daily. 30 g 1   omeprazole (PRILOSEC) 40 MG capsule Take 40 mg by mouth daily.     tiZANidine (ZANAFLEX) 2 MG tablet Take 2 mg by mouth.     triamcinolone cream (KENALOG) 0.5 % Apply 1 application topically 2 (two) times daily.     anastrozole  (ARIMIDEX ) 1 MG tablet Take 1 tablet (1 mg total)  by mouth daily. 30 tablet 3   bumetanide (BUMEX) 1 MG tablet Take 1 mg by mouth daily. (Patient not taking: Reported on 04/25/2024)     No current facility-administered medications for this visit.     PHYSICAL EXAMINATION: ECOG PERFORMANCE STATUS: 1 - Symptomatic but completely ambulatory Vitals:   04/25/24 1326  BP: (!) 125/59  Pulse: 91  Resp: 18  Temp: (!) 97.5 F (36.4 C)  SpO2: 100%   Filed Weights   04/25/24 1326  Weight: 139 lb 4.8 oz (63.2 kg)    Physical Exam Constitutional:      Appearance: She is not ill-appearing.  HENT:     Head: Normocephalic and atraumatic.  Eyes:     General: No scleral icterus.    Pupils: Pupils are equal, round, and reactive to light.  Cardiovascular:     Rate and Rhythm: Normal rate and regular rhythm.     Heart sounds: Normal heart sounds.  Pulmonary:     Effort: Pulmonary effort is normal. No respiratory distress.     Breath sounds: No wheezing.  Abdominal:     General: Bowel sounds are normal. There is no distension.     Palpations: Abdomen is soft. There is no mass.     Tenderness: There is no abdominal tenderness.  Musculoskeletal:        General: No deformity. Normal range of motion.     Cervical back: Normal range of  motion and neck supple.     Comments: Lower extremity edema  Skin:    General: Skin is warm and dry.  Neurological:     Mental Status: She is alert. Mental status is at baseline.  Psychiatric:        Mood and Affect: Mood normal.     LABORATORY DATA:  I have reviewed the data as listed    Latest Ref Rng & Units 04/25/2024    1:10 PM 10/19/2023    2:12 PM 03/18/2023    2:07 PM  CBC  WBC 4.0 - 10.5 K/uL 5.9  6.4  5.7   Hemoglobin 12.0 - 15.0 g/dL 88.4  88.9  89.1   Hematocrit 36.0 - 46.0 % 35.9  34.9  34.4   Platelets 150 - 400 K/uL 314  342  265       Latest Ref Rng & Units 04/25/2024    1:11 PM 10/19/2023    2:12 PM 03/18/2023    2:07 PM  CMP  Glucose 70 - 99 mg/dL 808  773  811   BUN 8 -  23 mg/dL 18  24  21    Creatinine 0.44 - 1.00 mg/dL 8.96  8.98  9.08   Sodium 135 - 145 mmol/L 139  132  138   Potassium 3.5 - 5.1 mmol/L 4.9  4.2  4.5   Chloride 98 - 111 mmol/L 102  101  104   CO2 22 - 32 mmol/L 22  18  22    Calcium  8.9 - 10.3 mg/dL 9.9  9.6  9.6   Total Protein 6.5 - 8.1 g/dL 7.6  7.8  7.0   Total Bilirubin 0.0 - 1.2 mg/dL 0.3  0.6  0.5   Alkaline Phos 38 - 126 U/L 74  68  60   AST 15 - 41 U/L 19  24  18    ALT 0 - 44 U/L 13  15  15       Iron /TIBC/Ferritin/ %Sat    Component Value Date/Time   IRON  54 10/19/2023 1412   TIBC 283 10/19/2023 1412   FERRITIN 41 10/19/2023 1412   IRONPCTSAT 19 10/19/2023 1412     RADIOGRAPHIC STUDIES: I have personally reviewed the radiological images as listed and agreed with the findings in the report. MM 3D SCREENING MAMMOGRAM BILATERAL BREAST Result Date: 04/19/2024 CLINICAL DATA:  Screening. EXAM: DIGITAL SCREENING BILATERAL MAMMOGRAM WITH TOMOSYNTHESIS AND CAD TECHNIQUE: Bilateral screening digital craniocaudal and mediolateral oblique mammograms were obtained. Bilateral screening digital breast tomosynthesis was performed. The images were evaluated with computer-aided detection. COMPARISON:  Previous exam(s). ACR Breast Density Category b: There are scattered areas of fibroglandular density. FINDINGS: Left breast post-lumpectomy changes. No findings suspicious for malignancy in either breast. IMPRESSION: No mammographic evidence of malignancy. A result letter of this screening mammogram will be mailed directly to the patient. RECOMMENDATION: Screening mammogram in one year. (Code:SM-B-01Y) BI-RADS CATEGORY  2: Benign. Electronically Signed   By: Curtistine Noble   On: 04/19/2024 05:06       "

## 2024-04-25 NOTE — Assessment & Plan Note (Signed)
 Patient is on Bumex.  Avoid nephrotoxins.

## 2024-12-05 ENCOUNTER — Inpatient Hospital Stay: Admitting: Oncology

## 2024-12-05 ENCOUNTER — Inpatient Hospital Stay
